# Patient Record
Sex: Female | Born: 1950
Health system: Southern US, Community
[De-identification: ages and names within clinical notes are randomized; demographics above are authoritative.]

## PROBLEM LIST (undated history)

## (undated) DIAGNOSIS — M810 Age-related osteoporosis without current pathological fracture: Secondary | ICD-10-CM

## (undated) DIAGNOSIS — G4730 Sleep apnea, unspecified: Secondary | ICD-10-CM

## (undated) DIAGNOSIS — K219 Gastro-esophageal reflux disease without esophagitis: Secondary | ICD-10-CM

## (undated) HISTORY — PX: CHOLECYSTECTOMY: SHX55

## (undated) HISTORY — PX: ABDOMINAL HYSTERECTOMY: SHX81

## (undated) HISTORY — PX: RHINOPLASTY: SHX2354

## (undated) HISTORY — PX: BACK SURGERY: SHX140

## (undated) HISTORY — PX: TUBAL LIGATION: SHX77

---

## 1997-11-09 ENCOUNTER — Other Ambulatory Visit: Admission: RE | Admit: 1997-11-09 | Discharge: 1997-11-09 | Payer: Self-pay | Admitting: *Deleted

## 1998-06-20 ENCOUNTER — Other Ambulatory Visit: Admission: RE | Admit: 1998-06-20 | Discharge: 1998-06-20 | Payer: Self-pay | Admitting: *Deleted

## 1998-07-07 HISTORY — PX: EYE SURGERY: SHX253

## 1999-01-14 ENCOUNTER — Other Ambulatory Visit: Admission: RE | Admit: 1999-01-14 | Discharge: 1999-01-14 | Payer: Self-pay | Admitting: Obstetrics and Gynecology

## 1999-11-21 ENCOUNTER — Other Ambulatory Visit: Admission: RE | Admit: 1999-11-21 | Discharge: 1999-11-21 | Payer: Self-pay | Admitting: Obstetrics and Gynecology

## 1999-11-22 ENCOUNTER — Encounter (INDEPENDENT_AMBULATORY_CARE_PROVIDER_SITE_OTHER): Payer: Self-pay

## 1999-11-22 ENCOUNTER — Other Ambulatory Visit: Admission: RE | Admit: 1999-11-22 | Discharge: 1999-11-22 | Payer: Self-pay | Admitting: Obstetrics and Gynecology

## 2000-04-03 ENCOUNTER — Other Ambulatory Visit: Admission: RE | Admit: 2000-04-03 | Discharge: 2000-04-03 | Payer: Self-pay | Admitting: Obstetrics and Gynecology

## 2000-04-09 ENCOUNTER — Encounter: Admission: RE | Admit: 2000-04-09 | Discharge: 2000-04-09 | Payer: Self-pay | Admitting: Obstetrics and Gynecology

## 2000-04-09 ENCOUNTER — Encounter: Payer: Self-pay | Admitting: Obstetrics and Gynecology

## 2000-04-15 ENCOUNTER — Encounter: Payer: Self-pay | Admitting: Obstetrics and Gynecology

## 2000-04-15 ENCOUNTER — Encounter: Admission: RE | Admit: 2000-04-15 | Discharge: 2000-04-15 | Payer: Self-pay | Admitting: Obstetrics and Gynecology

## 2005-02-28 ENCOUNTER — Emergency Department (HOSPITAL_COMMUNITY): Admission: EM | Admit: 2005-02-28 | Discharge: 2005-02-28 | Payer: Self-pay | Admitting: Emergency Medicine

## 2006-04-13 ENCOUNTER — Encounter (INDEPENDENT_AMBULATORY_CARE_PROVIDER_SITE_OTHER): Payer: Self-pay | Admitting: Specialist

## 2006-04-13 ENCOUNTER — Ambulatory Visit (HOSPITAL_COMMUNITY): Admission: RE | Admit: 2006-04-13 | Discharge: 2006-04-13 | Payer: Self-pay | Admitting: *Deleted

## 2008-05-23 ENCOUNTER — Encounter (INDEPENDENT_AMBULATORY_CARE_PROVIDER_SITE_OTHER): Payer: Self-pay | Admitting: Obstetrics

## 2008-05-23 ENCOUNTER — Ambulatory Visit (HOSPITAL_COMMUNITY): Admission: RE | Admit: 2008-05-23 | Discharge: 2008-05-24 | Payer: Self-pay | Admitting: Obstetrics

## 2009-01-29 ENCOUNTER — Ambulatory Visit (HOSPITAL_COMMUNITY): Admission: RE | Admit: 2009-01-29 | Discharge: 2009-01-29 | Payer: Self-pay | Admitting: Internal Medicine

## 2009-04-04 ENCOUNTER — Encounter (INDEPENDENT_AMBULATORY_CARE_PROVIDER_SITE_OTHER): Payer: Self-pay | Admitting: General Surgery

## 2009-04-04 ENCOUNTER — Observation Stay (HOSPITAL_COMMUNITY): Admission: RE | Admit: 2009-04-04 | Discharge: 2009-04-05 | Payer: Self-pay | Admitting: Surgery

## 2010-11-19 NOTE — Op Note (Signed)
Christy Flores, Christy Flores              ACCOUNT NO.:  [id number]   MEDICAL RECORD NO.:  [id number]          PATIENT TYPE:  OIB   LOCATION:  9309                          FACILITY:  [facility]   PHYSICIAN:  Lendon Colonel, MD   DATE OF BIRTH:  11-09-1950   DATE OF PROCEDURE:  05/23/2008  DATE OF DISCHARGE:                               OPERATIVE REPORT   PREOPERATIVE DIAGNOSES:  Persistent endometrial polyps, post menopausal  bleeding.   POSTOPERATIVE DIAGNOSES:  Persistent endometrial polyps, post menopausal  bleeding.   PROCEDURE:  Total laparoscopic hysterectomy, bilateral salpingo-  oophorectomy, cystoscopy.   SURGEON:  Lendon Colonel, MD   ASSISTANT:  Genia Del, MD   ANESTHESIA:  General.   FINDINGS:  Small uterus, normal tubes and ovaries, bilateral  peristalsing ureters, and normal cystoscopy post procedure.   SPECIMENS:  Uterine and cervix.   DISPOSITION:  To pathology.   ANTIBIOTICS:  1 g of Ancef.   ESTIMATED BLOOD LOSS:  100 mL.   COMPLICATIONS:  None.   PROCEDURE:  After informed consent for the procedure was obtained from  the patient, risks, benefits, and alternatives discussed.  The patient  was taken to the operating room where general anesthesia was induced  without difficulty.  She was prepped and draped in normal sterile  fashion in the dorsal supine lithotomy position.  The Foley catheter was  inserted sterilely into the bladder.  Bimanual examination was done to  assess the size and position of the uterus.  Sterile speculum was  performed.  Single-tooth tenaculum was used to grasp the anterior lip of  the cervix and the uterus was sounded to 8 cm.  The cervix was dilated  to a #23 Pratt and the #8 RUMI KOH catheter with the medium co-ring was  inserted into the uterus with co-ring around the cervix.  All  instruments were removed from the vagina.  Gloves were changed.  A 10-mm  skin incision was made in the infraumbilical fold after infusing  with  0.25% Marcaine.  This was carried through to the underlying layer of  fascia with the [employer] clamps.  The fascia was identified and grasped  with the Kochers and incised sharply.  Once the peritoneal cavity was  entered, a pursestring suture was placed along the fascial opening.  Hasson trocar was placed into the peritoneal cavity and pneumoperitoneum  was created to 15 mmHg.  Brief survey of the abdomen and pelvis revealed  essentially normal findings.  A 10-mm skin incision was made in left  lower quadrant and a 10-mm non-bladed trocar was inserted under direct  visualization.  A 5-mm skin incision was made in the right lower  quadrant and 5-mm non- bladed trocar was inserted under direct  visualization.  Grasping instruments were placed through these ports in  visualization of the patient's anatomy.  Bilateral ureter placement was  noted.  The EnSeal device was used to serially grasp, cauterize, and  transect the left round ligament, and the left IP ligament.  After the  round ligament was transected, the anterior leaf of broad ligament was  serially tented  up, cauterized, and transected to create a bladder flap,  this was on the left side.  On the right side, the right round ligament  was serially grasped, cauterized, and transected.  The anterior leaf of  the broad ligament on the right side was dissected off and the bladder  flap was created.  The right IP was then serially cauterized and  transected.  On the right side, the uterine artery was skeletonized and  transected with the EnSeal device.  On the left, additional traction was  used with the single-tooth tenaculum.  The left uterine artery was  identified, sealed, and transected with the EnSeal device.  Additional  dissection was taken down along the bladder flap.  The areolar tissue  was dissected off.  Once the bladder flap was clear, uterus was  appropriately blanched and good hemostasis was noted.  The Harmonic   scalpel was used to come across the upper vagina just inferior to the  external cervical os.  Harmonic scalpel was used to transect through the  RUMI KOH metallic ring.  Once the uterus and cervix were fully  transected, they were removed.  Uterus, cervix, bilateral tubes and  ovaries were removed through the vagina.  The balloon occluder was  replaced in the vagina to allow adequate pneumoperitoneum.  A 0 Vicryl  Quill suture with a Lapra-Ty on the distal end was placed through the  left lower quadrant port and starting at the right angle, the vaginal  cuff was closed just past the mid point of the incision.  The left angle  was then grasped with a separate suture of 0 Quill with a Lapra-Ty in  the end, and suturing was done across to the midpoint.  A finger was  placed in the vagina and noticed a defect in the midpoint of the vaginal  cuff and an additional suture was taken with the suture that started at  the left angle.  Once this midpoint was closed, the suture was cut and  removed.  A second check of the strength of the vaginal cuff was  performed with the manual exam under laparoscopic visualization.  At  this point, a very small hole was noted at the left angle.  Two  additional sutures were placed in left angle.  Excellent hemostasis was  noted.  The pelvis was irrigated with warm normal saline.  At this  point, a small amount of bleeding was noted from the peritoneal edge  just superior to the bladder flap and this was controlled with EnSeal  bipolar cautery.  The left ureter was seen peristalsing at the end of  the case; however, the right ureter was not seen peristalsing. Giving  the closeness of dissection to the right ureter due to the BSO decision  was made to proceed with cystoscopy.  Half a vile of indigo carmine was  given IV.  Cystoscopy was carried out.  The bladder was filled.  No  sutures or defects were noted in the bladder.  The bilateral ureters  were seen  peristalsing and blue dye and cystoscopy was finished.  Gloves  were changed.  Attention was turned to the patient's abdomen one last  time.  Again, good hemostasis was noted.  All pedicles were hemostatic  and the left 11-mm port had two 0 Vicryl sutures on a J-hook were thrown  through the fascia to create good closure of that defect.  Laparoscope  was used to ensure no underlying structures were caught.  All  gas was  then removed from the abdomen.  All trocars were then removed.  The  pursestring suture at the umbilicus was tied, 4-0 Vicryl interrupted  sutures were placed on the left lower quadrant of the umbilical  incisions and the remainder of the incisions were closed with Dermabond.  The patient tolerated the procedure well.  Sponge, lap, and needle  counts were correct x3.  The patient was taken to the recovery room in  stable condition.      Lendon Colonel, MD  Electronically Signed     KAF/MEDQ  D:  05/23/2008  T:  05/24/2008  Job:  [id number]

## 2010-11-22 NOTE — Op Note (Signed)
Christy Flores, Christy Flores              ACCOUNT NO.:  [id number]   MEDICAL RECORD NO.:  [id number]          PATIENT TYPE:  AMB   LOCATION:  SDC                           FACILITY:  [facility]   PHYSICIAN:  Mogul B. Earlene Plater, M.D.  DATE OF BIRTH:  [birth year]   DATE OF PROCEDURE:  04/13/2006  DATE OF DISCHARGE:                                 OPERATIVE REPORT   PREOPERATIVE DIAGNOSES:  1. Postmenopausal bleeding.  2. Endometrial polyp.   POSTOPERATIVE DIAGNOSES:  1. Postmenopausal bleeding.  2. Endometrial polyp.   OPERATION/PROCEDURE:  Hysteroscopy and resection of endometrial polyp.   SURGEON:  Chester Holstein. Earlene Plater, M.D.   ASSISTANT:  None.   ANESTHESIA:  LMA, general and 10 mL 1% Nesacaine paracervical block.   SPECIMENS:  Endometrial polyp.  Submitted to pathology.   ESTIMATED BLOOD LOSS:  Minimal.   FLUID DEFICIT:  40 mL sorbitol.   COMPLICATIONS:  None.   FINDINGS:  Overall suppressed appearing endometrium with approximately 2 cm  endometrial polyp arising from the left lower uterine segment.   INDICATIONS:  The patient with a history of postmenopausal bleeding.  Ultrasound and subsequent saline infusion ultrasound showed a large  endometrial polyp.  The patient was advised of the risks of surgery  including infection, bleeding, uterine perforation, damage to surrounding  organs.   DESCRIPTION OF PROCEDURE:  The patient was taken to operating room and LMA  general anesthesia obtained.  She was placed in the [place] stirrups and  prepped and draped in the standard fashion.  Bladder emptied with the hand  held catheter.  Examination under anesthesia showed a retroverted normal  size uterus.  No adnexal masses.   Speculum was inserted.  Paracervical block placed and cervix grasped with a  single-tooth tenaculum and dilated to #17.  The diagnostic hysteroscope was  inserted and polyp noted as outlined above.  Attempt was made to grasp the  polyp with Randall-Stone forceps.  However, it  was too large.  Therefore,  the cervix was dilated to allow passage of the resectoscope.   The resectoscope was inserted, single loop used and the polyp resected at is  base and removed.  The cavity appeared normal otherwise and the procedure  was terminated.   Instruments were removed. The cervix was hemostatic.  The patient tolerated  the procedure well. There were no complications.  She was taken to the  recovery room in stable condition.      Gerri Spore B. Earlene Plater, M.D.  Electronically Signed     WBD/MEDQ  D:  04/13/2006  T:  04/14/2006  Job:  [id number]

## 2011-04-08 LAB — URINALYSIS, ROUTINE W REFLEX MICROSCOPIC
Bilirubin Urine: NEGATIVE
Hgb urine dipstick: NEGATIVE
Ketones, ur: 15 — AB
Protein, ur: 30 — AB
Urobilinogen, UA: 0.2

## 2011-04-08 LAB — CBC
HCT: 34.1 — ABNORMAL LOW
Hemoglobin: 14.5
MCHC: 33.9
MCV: 98.2
Platelets: 202
RDW: 12.8
RDW: 13.3
WBC: 9

## 2011-04-08 LAB — URINE MICROSCOPIC-ADD ON

## 2011-04-08 LAB — BASIC METABOLIC PANEL
CO2: 29
Calcium: 9.2
Chloride: 102
Creatinine, Ser: 0.67
Glucose, Bld: 97
Sodium: 138

## 2014-04-05 ENCOUNTER — Ambulatory Visit (INDEPENDENT_AMBULATORY_CARE_PROVIDER_SITE_OTHER): Payer: BC Managed Care – PPO | Admitting: Podiatry

## 2014-04-05 ENCOUNTER — Ambulatory Visit (INDEPENDENT_AMBULATORY_CARE_PROVIDER_SITE_OTHER): Payer: BC Managed Care – PPO

## 2014-04-05 VITALS — BP 161/85 | HR 64 | Resp 17

## 2014-04-05 DIAGNOSIS — M722 Plantar fascial fibromatosis: Secondary | ICD-10-CM

## 2014-04-05 DIAGNOSIS — R52 Pain, unspecified: Secondary | ICD-10-CM

## 2014-04-05 MED ORDER — TRIAMCINOLONE ACETONIDE 10 MG/ML IJ SUSP
10.0000 mg | Freq: Once | INTRAMUSCULAR | Status: AC
  Administered 2014-04-05: 10 mg

## 2014-04-05 NOTE — Progress Notes (Signed)
   Subjective:    Patient ID: Christy Flores, female    DOB: 1950/09/12, 63 y.o.   MRN: [id number]  HPI [name] presents with bilateral heel pain which has been ongoing since July 2015.  He states that the pain is sharp and is worse in the morning but has become more constant throughout the day. She does state that she has had to decrease her physical activity due to the heel pain. She denies any history of injury or trauma to the area. She has been massaging her feet and performing stretching exercises which seem to help somewhat. No other complaints at this time.   Review of Systems  All other systems reviewed and are negative.      Objective:   Physical Exam AAO x3, NAD DP/PT pulses palpable 2/4 b/l. CRT < 3 sec Protective sensation intact with Simms Weinstein monofilament, vibratory sensation intact, Achilles tendon reflex intact. Tenderness to palpation over the plantar medial tubercle of the calcaneus bilaterally at the insertion of the plantar fascia. Mild tenderness along the medial band of the plantar fascia. No pain with lateral compression of the calcaneus or along the posterior aspect. Plantar fascia appears to be intact without any discomfort into the arch of the foot. MMT 5/5, ROM WNL No open skin lesions. No calf pain, swelling, warmth.        Assessment & Plan:  63 year old female bilateral plantar fasciitis. -X-rays were obtained and reviewed the patient. -Conservative versus surgical treatment were discussed including alternatives, risks, complications. -Patient elects to proceed with steroid injection into the bilateral heels. Under sterile skin preparation, a total of 2.5cc of kenalog 10, 0.5% Marcaine plain, and 2% lidocaine plain were infiltrated into the symptomatic area without complication. A band-aid was applied. Patient tolerated the injection well without complication.  -Bilateral plantar fascial brace applied. -Ice to the effected area. -Discussed  stretching exercises. -Discussed supportive shoe gear and possible orthotic therapy. -Followup in 3 weeks or sooner if any problems are to arise or any changes symptoms. In the meantime call any questions, concerns.

## 2014-04-05 NOTE — Patient Instructions (Signed)
Plantar Fasciitis (Heel Spur Syndrome) with Rehab The plantar fascia is a fibrous, ligament-like, soft-tissue structure that spans the bottom of the foot. Plantar fasciitis is a condition that causes pain in the foot due to inflammation of the tissue. SYMPTOMS   Pain and tenderness on the underneath side of the foot.  Pain that worsens with standing or walking. CAUSES  Plantar fasciitis is caused by irritation and injury to the plantar fascia on the underneath side of the foot. Common mechanisms of injury include:  Direct trauma to bottom of the foot.  Damage to a small nerve that runs under the foot where the main fascia attaches to the heel bone.  Stress placed on the plantar fascia due to bone spurs. RISK INCREASES WITH:   Activities that place stress on the plantar fascia (running, jumping, pivoting, or cutting).  Poor strength and flexibility.  Improperly fitted shoes.  Tight calf muscles.  Flat feet.  Failure to warm-up properly before activity.  Obesity. PREVENTION  Warm up and stretch properly before activity.  Allow for adequate recovery between workouts.  Maintain physical fitness:  Strength, flexibility, and endurance.  Cardiovascular fitness.  Maintain a health body weight.  Avoid stress on the plantar fascia.  Wear properly fitted shoes, including arch supports for individuals who have flat feet. PROGNOSIS  If treated properly, then the symptoms of plantar fasciitis usually resolve without surgery. However, occasionally surgery is necessary. RELATED COMPLICATIONS   Recurrent symptoms that may result in a chronic condition.  Problems of the lower back that are caused by compensating for the injury, such as limping.  Pain or weakness of the foot during push-off following surgery.  Chronic inflammation, scarring, and partial or complete fascia tear, occurring more often from repeated injections. TREATMENT  Treatment initially involves the use of  ice and medication to help reduce pain and inflammation. The use of strengthening and stretching exercises may help reduce pain with activity, especially stretches of the Achilles tendon. These exercises may be performed at home or with a therapist. Your caregiver may recommend that you use heel cups of arch supports to help reduce stress on the plantar fascia. Occasionally, corticosteroid injections are given to reduce inflammation. If symptoms persist for greater than 6 months despite non-surgical (conservative), then surgery may be recommended.  MEDICATION   If pain medication is necessary, then nonsteroidal anti-inflammatory medications, such as aspirin and ibuprofen, or other minor pain relievers, such as acetaminophen, are often recommended.  Do not take pain medication within 7 days before surgery.  Prescription pain relievers may be given if deemed necessary by your caregiver. Use only as directed and only as much as you need.  Corticosteroid injections may be given by your caregiver. These injections should be reserved for the most serious cases, because they may only be given a certain number of times. HEAT AND COLD  Cold treatment (icing) relieves pain and reduces inflammation. Cold treatment should be applied for 10 to 15 minutes every 2 to 3 hours for inflammation and pain and immediately after any activity that aggravates your symptoms. Use ice packs or massage the area with a piece of ice (ice massage).  Heat treatment may be used prior to performing the stretching and strengthening activities prescribed by your caregiver, physical therapist, or athletic trainer. Use a heat pack or soak the injury in warm water. SEEK IMMEDIATE MEDICAL CARE IF:  Treatment seems to offer no benefit, or the condition worsens.  Any medications produce adverse side effects. EXERCISES RANGE   OF MOTION (ROM) AND STRETCHING EXERCISES - Plantar Fasciitis (Heel Spur Syndrome) These exercises may help you  when beginning to rehabilitate your injury. Your symptoms may resolve with or without further involvement from your physician, physical therapist or athletic trainer. While completing these exercises, remember:   Restoring tissue flexibility helps normal motion to return to the joints. This allows healthier, less painful movement and activity.  An effective stretch should be held for at least 30 seconds.  A stretch should never be painful. You should only feel a gentle lengthening or release in the stretched tissue. RANGE OF MOTION - Toe Extension, Flexion  Sit with your right / left leg crossed over your opposite knee.  Grasp your toes and gently pull them back toward the top of your foot. You should feel a stretch on the bottom of your toes and/or foot.  Hold this stretch for __________ seconds.  Now, gently pull your toes toward the bottom of your foot. You should feel a stretch on the top of your toes and or foot.  Hold this stretch for __________ seconds. Repeat __________ times. Complete this stretch __________ times per day.  RANGE OF MOTION - Ankle Dorsiflexion, Active Assisted  Remove shoes and sit on a chair that is preferably not on a carpeted surface.  Place right / left foot under knee. Extend your opposite leg for support.  Keeping your heel down, slide your right / left foot back toward the chair until you feel a stretch at your ankle or calf. If you do not feel a stretch, slide your bottom forward to the edge of the chair, while still keeping your heel down.  Hold this stretch for __________ seconds. Repeat __________ times. Complete this stretch __________ times per day.  STRETCH - Gastroc, Standing  Place hands on wall.  Extend right / left leg, keeping the front knee somewhat bent.  Slightly point your toes inward on your back foot.  Keeping your right / left heel on the floor and your knee straight, shift your weight toward the wall, not allowing your back to  arch.  You should feel a gentle stretch in the right / left calf. Hold this position for __________ seconds. Repeat __________ times. Complete this stretch __________ times per day. STRETCH - Soleus, Standing  Place hands on wall.  Extend right / left leg, keeping the other knee somewhat bent.  Slightly point your toes inward on your back foot.  Keep your right / left heel on the floor, bend your back knee, and slightly shift your weight over the back leg so that you feel a gentle stretch deep in your back calf.  Hold this position for __________ seconds. Repeat __________ times. Complete this stretch __________ times per day. STRETCH - Gastrocsoleus, Standing  Note: This exercise can place a lot of stress on your foot and ankle. Please complete this exercise only if specifically instructed by your caregiver.   Place the ball of your right / left foot on a step, keeping your other foot firmly on the same step.  Hold on to the wall or a rail for balance.  Slowly lift your other foot, allowing your body weight to press your heel down over the edge of the step.  You should feel a stretch in your right / left calf.  Hold this position for __________ seconds.  Repeat this exercise with a slight bend in your right / left knee. Repeat __________ times. Complete this stretch __________ times per day.    STRENGTHENING EXERCISES - Plantar Fasciitis (Heel Spur Syndrome)  These exercises may help you when beginning to rehabilitate your injury. They may resolve your symptoms with or without further involvement from your physician, physical therapist or athletic trainer. While completing these exercises, remember:   Muscles can gain both the endurance and the strength needed for everyday activities through controlled exercises.  Complete these exercises as instructed by your physician, physical therapist or athletic trainer. Progress the resistance and repetitions only as guided. STRENGTH -  Towel Curls  Sit in a chair positioned on a non-carpeted surface.  Place your foot on a towel, keeping your heel on the floor.  Pull the towel toward your heel by only curling your toes. Keep your heel on the floor.  If instructed by your physician, physical therapist or athletic trainer, add ____________________ at the end of the towel. Repeat __________ times. Complete this exercise __________ times per day. STRENGTH - Ankle Inversion  Secure one end of a rubber exercise band/tubing to a fixed object (table, pole). Loop the other end around your foot just before your toes.  Place your fists between your knees. This will focus your strengthening at your ankle.  Slowly, pull your big toe up and in, making sure the band/tubing is positioned to resist the entire motion.  Hold this position for __________ seconds.  Have your muscles resist the band/tubing as it slowly pulls your foot back to the starting position. Repeat __________ times. Complete this exercises __________ times per day.  Document Released: 06/23/2005 Document Revised: 09/15/2011 Document Reviewed: 10/05/2008 [employer] Patient Information 2015 [employer]. This information is not intended to replace advice given to you by your health care provider. Make sure you discuss any questions you have with your health care provider.  

## 2014-04-26 ENCOUNTER — Encounter: Payer: Self-pay | Admitting: Podiatry

## 2014-04-26 ENCOUNTER — Ambulatory Visit (INDEPENDENT_AMBULATORY_CARE_PROVIDER_SITE_OTHER): Payer: BC Managed Care – PPO | Admitting: Podiatry

## 2014-04-26 VITALS — BP 128/71 | HR 75 | Resp 13

## 2014-04-26 DIAGNOSIS — M722 Plantar fascial fibromatosis: Secondary | ICD-10-CM

## 2014-04-28 NOTE — Progress Notes (Signed)
Patient ID: Christy Flores, female   DOB: 01/05/1951, 63 y.o.   MRN: [id number]  Subjective: [name], 63 year old female, returns the office they for followup evaluation of bilateral heel pain which has been ongoing since July 2015. I last appointment the patient underwent steroid injection and she has been using the plantar fascial braces. She is also been icing as well as performing the stretching exercises. She states that she has significant improvement in her symptoms. She gets occasional mild discomfort in the bottom of her heels however it is intermittent and much improved. Denies any acute changes since last appointment. No other complaints at this time.  Objective: AAO x3, NAD DP/PT pulses palpable bilaterally, CRT less than 3 seconds Protective sensation intact with Simms Weinstein monofilament, vibratory sensation intact, Achilles tendon reflex intact No tenderness to palpation of the plantar medial tubercle of the calcaneus at the insertion of the plantar fascia. No pain with lateral compression of the calcaneus or along the posterior aspect. No pain with vibratory sensation. No pain along the course of the plantar fascia within the arch of the foot. No edema, erythema, increased warmth to the area. Mild equinus bilaterally. MMT 5/5, ROM WNL No calf pain, swelling, warmth, erythema No open lesions or pre-ulcer lesions  Assessment: 63 year old female followup evaluation of bilateral plantar fasciitis  Plan: -Treatment options discussed including alternatives, risks, complications -At this time patient is doing well therefore we'll hold off on another steroid injection this time. -Continue stretching and icing. -Continue supportive shoe gear -Continue plantar fascial brace as needed -Followup as needed. In the meantime call the office with any questions, concerns, change in symptoms.

## 2014-12-29 DIAGNOSIS — E559 Vitamin D deficiency, unspecified: Secondary | ICD-10-CM | POA: Insufficient documentation

## 2014-12-29 DIAGNOSIS — J309 Allergic rhinitis, unspecified: Secondary | ICD-10-CM | POA: Insufficient documentation

## 2014-12-29 DIAGNOSIS — E669 Obesity, unspecified: Secondary | ICD-10-CM | POA: Insufficient documentation

## 2014-12-29 DIAGNOSIS — G4733 Obstructive sleep apnea (adult) (pediatric): Secondary | ICD-10-CM | POA: Insufficient documentation

## 2018-07-08 ENCOUNTER — Other Ambulatory Visit (HOSPITAL_COMMUNITY): Payer: Self-pay | Admitting: Internal Medicine

## 2018-07-08 DIAGNOSIS — R109 Unspecified abdominal pain: Secondary | ICD-10-CM

## 2018-07-19 ENCOUNTER — Ambulatory Visit (HOSPITAL_COMMUNITY)
Admission: RE | Admit: 2018-07-19 | Discharge: 2018-07-19 | Disposition: A | Discharge status: Home / Self Care | Payer: Medicare Other | Source: Ambulatory Visit | Attending: Internal Medicine | Admitting: Internal Medicine

## 2018-07-19 DIAGNOSIS — R109 Unspecified abdominal pain: Secondary | ICD-10-CM | POA: Insufficient documentation

## 2018-09-03 ENCOUNTER — Ambulatory Visit (HOSPITAL_COMMUNITY)
Admission: RE | Admit: 2018-09-03 | Discharge: 2018-09-03 | Disposition: A | Discharge status: Home / Self Care | Payer: Medicare Other | Source: Ambulatory Visit | Attending: Internal Medicine | Admitting: Internal Medicine

## 2018-09-03 ENCOUNTER — Other Ambulatory Visit (HOSPITAL_COMMUNITY): Payer: Self-pay | Admitting: Internal Medicine

## 2018-09-03 DIAGNOSIS — M8580 Other specified disorders of bone density and structure, unspecified site: Secondary | ICD-10-CM | POA: Insufficient documentation

## 2018-09-03 DIAGNOSIS — M810 Age-related osteoporosis without current pathological fracture: Secondary | ICD-10-CM | POA: Insufficient documentation

## 2018-09-24 ENCOUNTER — Encounter (HOSPITAL_COMMUNITY): Admission: RE | Admit: 2018-09-24 | Payer: Medicare Other | Source: Ambulatory Visit

## 2018-12-06 ENCOUNTER — Encounter (HOSPITAL_COMMUNITY): Payer: Self-pay | Admitting: *Deleted

## 2018-12-23 ENCOUNTER — Other Ambulatory Visit: Payer: Self-pay

## 2018-12-23 ENCOUNTER — Encounter (HOSPITAL_COMMUNITY)
Admission: RE | Admit: 2018-12-23 | Discharge: 2018-12-23 | Disposition: A | Discharge status: Home / Self Care | Payer: Medicare Other | Source: Ambulatory Visit | Attending: Internal Medicine | Admitting: Internal Medicine

## 2018-12-23 DIAGNOSIS — M8000XA Age-related osteoporosis with current pathological fracture, unspecified site, initial encounter for fracture: Secondary | ICD-10-CM | POA: Diagnosis present

## 2018-12-23 MED ORDER — DENOSUMAB 60 MG/ML ~~LOC~~ SOSY
60.0000 mg | PREFILLED_SYRINGE | Freq: Once | SUBCUTANEOUS | Status: AC
  Administered 2018-12-23: 60 mg via SUBCUTANEOUS

## 2019-06-24 ENCOUNTER — Other Ambulatory Visit: Payer: Self-pay

## 2019-06-24 ENCOUNTER — Encounter (HOSPITAL_COMMUNITY)
Admission: RE | Admit: 2019-06-24 | Discharge: 2019-06-24 | Disposition: A | Discharge status: Home / Self Care | Payer: Medicare Other | Source: Ambulatory Visit | Attending: Internal Medicine | Admitting: Internal Medicine

## 2019-06-24 DIAGNOSIS — M810 Age-related osteoporosis without current pathological fracture: Secondary | ICD-10-CM | POA: Diagnosis not present

## 2019-06-24 MED ORDER — DENOSUMAB 60 MG/ML ~~LOC~~ SOSY
60.0000 mg | PREFILLED_SYRINGE | Freq: Once | SUBCUTANEOUS | Status: AC
  Administered 2019-06-24: 11:00:00 60 mg via SUBCUTANEOUS

## 2019-12-23 ENCOUNTER — Encounter (HOSPITAL_COMMUNITY)
Admission: RE | Admit: 2019-12-23 | Discharge: 2019-12-23 | Disposition: A | Discharge status: Home / Self Care | Payer: Medicare PPO | Source: Ambulatory Visit | Attending: Internal Medicine | Admitting: Internal Medicine

## 2019-12-23 ENCOUNTER — Other Ambulatory Visit: Payer: Self-pay

## 2019-12-23 ENCOUNTER — Encounter (HOSPITAL_COMMUNITY): Payer: Medicare Other

## 2019-12-23 DIAGNOSIS — M810 Age-related osteoporosis without current pathological fracture: Secondary | ICD-10-CM | POA: Diagnosis not present

## 2019-12-23 MED ORDER — DENOSUMAB 60 MG/ML ~~LOC~~ SOSY
60.0000 mg | PREFILLED_SYRINGE | Freq: Once | SUBCUTANEOUS | Status: AC
  Administered 2019-12-23: 60 mg via SUBCUTANEOUS

## 2019-12-23 MED ORDER — DENOSUMAB 60 MG/ML ~~LOC~~ SOSY
PREFILLED_SYRINGE | SUBCUTANEOUS | Status: AC
  Filled 2019-12-23: qty 1

## 2020-02-13 DIAGNOSIS — H43812 Vitreous degeneration, left eye: Secondary | ICD-10-CM | POA: Diagnosis not present

## 2020-04-05 DIAGNOSIS — H02831 Dermatochalasis of right upper eyelid: Secondary | ICD-10-CM | POA: Diagnosis not present

## 2020-04-05 DIAGNOSIS — H02834 Dermatochalasis of left upper eyelid: Secondary | ICD-10-CM | POA: Diagnosis not present

## 2020-05-17 ENCOUNTER — Ambulatory Visit: Payer: Medicare PPO | Attending: Internal Medicine

## 2020-05-17 DIAGNOSIS — Z23 Encounter for immunization: Secondary | ICD-10-CM

## 2020-05-17 NOTE — Progress Notes (Signed)
   Covid-19 Vaccination Clinic  Name:  KIMANH TEMPLEMAN    MRN: [id number] DOB: 1950/11/10  05/17/2020  Ms. Grinnell was observed post Covid-19 immunization for 15 minutes without incident. She was provided with Vaccine Information Sheet and instruction to access the V-Safe system.   Ms. Mccuistion was instructed to call 911 with any severe reactions post vaccine: Marland Kitchen Difficulty breathing  . Swelling of face and throat  . A fast heartbeat  . A bad rash all over body  . Dizziness and weakness

## 2020-06-13 DIAGNOSIS — Z23 Encounter for immunization: Secondary | ICD-10-CM | POA: Diagnosis not present

## 2020-06-25 ENCOUNTER — Other Ambulatory Visit: Payer: Self-pay

## 2020-06-25 ENCOUNTER — Encounter (HOSPITAL_COMMUNITY)
Admission: RE | Admit: 2020-06-25 | Discharge: 2020-06-25 | Disposition: A | Discharge status: Home / Self Care | Payer: Medicare PPO | Source: Ambulatory Visit | Attending: Internal Medicine | Admitting: Internal Medicine

## 2020-06-25 ENCOUNTER — Encounter (HOSPITAL_COMMUNITY): Payer: Self-pay

## 2020-06-25 DIAGNOSIS — M810 Age-related osteoporosis without current pathological fracture: Secondary | ICD-10-CM | POA: Diagnosis not present

## 2020-06-25 HISTORY — DX: Age-related osteoporosis without current pathological fracture: M81.0

## 2020-06-25 HISTORY — DX: Gastro-esophageal reflux disease without esophagitis: K21.9

## 2020-06-25 HISTORY — DX: Sleep apnea, unspecified: G47.30

## 2020-06-25 LAB — POCT I-STAT, CHEM 8
BUN: 14 mg/dL (ref 8–23)
Calcium, Ion: 1.17 mmol/L (ref 1.15–1.40)
Chloride: 103 mmol/L (ref 98–111)
Creatinine, Ser: 0.7 mg/dL (ref 0.44–1.00)
Glucose, Bld: 98 mg/dL (ref 70–99)
HCT: 41 % (ref 36.0–46.0)
Hemoglobin: 13.9 g/dL (ref 12.0–15.0)
Potassium: 4.3 mmol/L (ref 3.5–5.1)
Sodium: 139 mmol/L (ref 135–145)
TCO2: 26 mmol/L (ref 22–32)

## 2020-06-25 MED ORDER — DENOSUMAB 60 MG/ML ~~LOC~~ SOSY
60.0000 mg | PREFILLED_SYRINGE | Freq: Once | SUBCUTANEOUS | Status: AC
  Administered 2020-06-25: 60 mg via SUBCUTANEOUS

## 2020-08-22 DIAGNOSIS — H02423 Myogenic ptosis of bilateral eyelids: Secondary | ICD-10-CM | POA: Diagnosis not present

## 2020-08-22 DIAGNOSIS — H02834 Dermatochalasis of left upper eyelid: Secondary | ICD-10-CM | POA: Diagnosis not present

## 2020-08-22 DIAGNOSIS — H02831 Dermatochalasis of right upper eyelid: Secondary | ICD-10-CM | POA: Diagnosis not present

## 2020-11-26 DIAGNOSIS — H02834 Dermatochalasis of left upper eyelid: Secondary | ICD-10-CM | POA: Diagnosis not present

## 2020-11-26 DIAGNOSIS — H02423 Myogenic ptosis of bilateral eyelids: Secondary | ICD-10-CM | POA: Diagnosis not present

## 2020-11-26 DIAGNOSIS — H02831 Dermatochalasis of right upper eyelid: Secondary | ICD-10-CM | POA: Diagnosis not present

## 2020-12-24 ENCOUNTER — Ambulatory Visit (HOSPITAL_COMMUNITY): Payer: Medicare PPO

## 2020-12-25 ENCOUNTER — Other Ambulatory Visit: Payer: Self-pay

## 2020-12-25 ENCOUNTER — Encounter (HOSPITAL_COMMUNITY)
Admission: RE | Admit: 2020-12-25 | Discharge: 2020-12-25 | Disposition: A | Discharge status: Home / Self Care | Payer: Medicare PPO | Source: Ambulatory Visit | Attending: Internal Medicine | Admitting: Internal Medicine

## 2020-12-25 DIAGNOSIS — M810 Age-related osteoporosis without current pathological fracture: Secondary | ICD-10-CM | POA: Insufficient documentation

## 2020-12-25 MED ORDER — DENOSUMAB 60 MG/ML ~~LOC~~ SOSY
PREFILLED_SYRINGE | SUBCUTANEOUS | Status: AC
  Filled 2020-12-25: qty 1

## 2020-12-25 MED ORDER — DENOSUMAB 60 MG/ML ~~LOC~~ SOSY
60.0000 mg | PREFILLED_SYRINGE | Freq: Once | SUBCUTANEOUS | Status: AC
  Administered 2020-12-25: 60 mg via SUBCUTANEOUS

## 2021-04-04 DIAGNOSIS — Z23 Encounter for immunization: Secondary | ICD-10-CM | POA: Diagnosis not present

## 2021-04-29 ENCOUNTER — Other Ambulatory Visit: Payer: Self-pay | Admitting: Internal Medicine

## 2021-04-29 DIAGNOSIS — Z1231 Encounter for screening mammogram for malignant neoplasm of breast: Secondary | ICD-10-CM

## 2021-05-23 ENCOUNTER — Ambulatory Visit: Payer: Medicare PPO

## 2021-06-26 ENCOUNTER — Encounter (HOSPITAL_COMMUNITY)
Admission: RE | Admit: 2021-06-26 | Discharge: 2021-06-26 | Disposition: A | Discharge status: Home / Self Care | Payer: Medicare PPO | Source: Ambulatory Visit | Attending: Internal Medicine | Admitting: Internal Medicine

## 2021-07-03 ENCOUNTER — Other Ambulatory Visit: Payer: Self-pay

## 2021-07-03 ENCOUNTER — Encounter (HOSPITAL_COMMUNITY)
Admission: RE | Admit: 2021-07-03 | Discharge: 2021-07-03 | Disposition: A | Discharge status: Home / Self Care | Payer: Medicare PPO | Source: Ambulatory Visit | Attending: Internal Medicine | Admitting: Internal Medicine

## 2021-07-03 DIAGNOSIS — M810 Age-related osteoporosis without current pathological fracture: Secondary | ICD-10-CM | POA: Insufficient documentation

## 2021-07-03 MED ORDER — DENOSUMAB 60 MG/ML ~~LOC~~ SOSY
PREFILLED_SYRINGE | SUBCUTANEOUS | Status: AC
  Filled 2021-07-03: qty 1

## 2021-07-03 MED ORDER — DENOSUMAB 60 MG/ML ~~LOC~~ SOSY
60.0000 mg | PREFILLED_SYRINGE | Freq: Once | SUBCUTANEOUS | Status: AC
  Administered 2021-07-03: 15:00:00 60 mg via SUBCUTANEOUS

## 2021-07-07 DIAGNOSIS — Z923 Personal history of irradiation: Secondary | ICD-10-CM

## 2021-07-07 HISTORY — PX: BREAST LUMPECTOMY: SHX2

## 2021-07-07 HISTORY — DX: Personal history of irradiation: Z92.3

## 2021-08-26 DIAGNOSIS — Z01810 Encounter for preprocedural cardiovascular examination: Secondary | ICD-10-CM | POA: Diagnosis not present

## 2021-10-04 ENCOUNTER — Other Ambulatory Visit (HOSPITAL_COMMUNITY): Payer: Self-pay | Admitting: Internal Medicine

## 2021-10-04 DIAGNOSIS — Z1231 Encounter for screening mammogram for malignant neoplasm of breast: Secondary | ICD-10-CM

## 2021-10-07 ENCOUNTER — Ambulatory Visit (HOSPITAL_COMMUNITY)
Admission: RE | Admit: 2021-10-07 | Discharge: 2021-10-07 | Disposition: A | Discharge status: Home / Self Care | Payer: Medicare PPO | Source: Ambulatory Visit | Attending: Internal Medicine | Admitting: Internal Medicine

## 2021-10-07 DIAGNOSIS — Z1231 Encounter for screening mammogram for malignant neoplasm of breast: Secondary | ICD-10-CM | POA: Diagnosis not present

## 2021-10-08 ENCOUNTER — Other Ambulatory Visit (HOSPITAL_COMMUNITY): Payer: Self-pay | Admitting: Internal Medicine

## 2021-10-08 DIAGNOSIS — R928 Other abnormal and inconclusive findings on diagnostic imaging of breast: Secondary | ICD-10-CM

## 2021-10-22 ENCOUNTER — Ambulatory Visit (HOSPITAL_COMMUNITY)
Admission: RE | Admit: 2021-10-22 | Discharge: 2021-10-22 | Disposition: A | Discharge status: Home / Self Care | Payer: Medicare PPO | Source: Ambulatory Visit | Attending: Internal Medicine | Admitting: Internal Medicine

## 2021-10-22 ENCOUNTER — Other Ambulatory Visit (HOSPITAL_COMMUNITY): Payer: Self-pay | Admitting: Internal Medicine

## 2021-10-22 DIAGNOSIS — R928 Other abnormal and inconclusive findings on diagnostic imaging of breast: Secondary | ICD-10-CM

## 2021-10-22 DIAGNOSIS — N6315 Unspecified lump in the right breast, overlapping quadrants: Secondary | ICD-10-CM | POA: Diagnosis not present

## 2021-10-23 ENCOUNTER — Other Ambulatory Visit (HOSPITAL_COMMUNITY): Payer: Self-pay | Admitting: Internal Medicine

## 2021-10-23 ENCOUNTER — Ambulatory Visit (HOSPITAL_COMMUNITY)
Admission: RE | Admit: 2021-10-23 | Discharge: 2021-10-23 | Disposition: A | Discharge status: Home / Self Care | Payer: Medicare PPO | Source: Ambulatory Visit | Attending: Internal Medicine | Admitting: Internal Medicine

## 2021-10-23 ENCOUNTER — Other Ambulatory Visit: Payer: Self-pay | Admitting: Internal Medicine

## 2021-10-23 DIAGNOSIS — N6341 Unspecified lump in right breast, subareolar: Secondary | ICD-10-CM | POA: Diagnosis not present

## 2021-10-23 DIAGNOSIS — R921 Mammographic calcification found on diagnostic imaging of breast: Secondary | ICD-10-CM

## 2021-10-23 DIAGNOSIS — R928 Other abnormal and inconclusive findings on diagnostic imaging of breast: Secondary | ICD-10-CM | POA: Insufficient documentation

## 2021-10-23 MED ORDER — LIDOCAINE HCL (PF) 2 % IJ SOLN
INTRAMUSCULAR | Status: AC
  Filled 2021-10-23: qty 10

## 2021-10-23 MED ORDER — LIDOCAINE-EPINEPHRINE (PF) 1 %-1:200000 IJ SOLN
INTRAMUSCULAR | Status: AC
  Filled 2021-10-23: qty 30

## 2021-10-28 LAB — SURGICAL PATHOLOGY

## 2021-10-31 ENCOUNTER — Other Ambulatory Visit (HOSPITAL_COMMUNITY): Payer: Medicare PPO

## 2021-10-31 ENCOUNTER — Encounter (HOSPITAL_COMMUNITY): Payer: Medicare PPO

## 2021-11-06 ENCOUNTER — Ambulatory Visit
Admission: RE | Admit: 2021-11-06 | Discharge: 2021-11-06 | Disposition: A | Discharge status: Home / Self Care | Payer: Medicare PPO | Source: Ambulatory Visit | Attending: Internal Medicine | Admitting: Internal Medicine

## 2021-11-06 DIAGNOSIS — R921 Mammographic calcification found on diagnostic imaging of breast: Secondary | ICD-10-CM

## 2021-11-06 DIAGNOSIS — R928 Other abnormal and inconclusive findings on diagnostic imaging of breast: Secondary | ICD-10-CM

## 2021-11-06 DIAGNOSIS — N6011 Diffuse cystic mastopathy of right breast: Secondary | ICD-10-CM | POA: Diagnosis not present

## 2021-11-06 HISTORY — PX: BREAST BIOPSY: SHX20

## 2021-11-08 DIAGNOSIS — Z170 Estrogen receptor positive status [ER+]: Secondary | ICD-10-CM | POA: Diagnosis not present

## 2021-11-08 DIAGNOSIS — Z803 Family history of malignant neoplasm of breast: Secondary | ICD-10-CM | POA: Diagnosis not present

## 2021-11-08 DIAGNOSIS — C50111 Malignant neoplasm of central portion of right female breast: Secondary | ICD-10-CM | POA: Diagnosis not present

## 2021-11-08 DIAGNOSIS — C50011 Malignant neoplasm of nipple and areola, right female breast: Secondary | ICD-10-CM | POA: Diagnosis not present

## 2021-11-11 ENCOUNTER — Other Ambulatory Visit: Payer: Self-pay | Admitting: General Surgery

## 2021-11-11 DIAGNOSIS — C50111 Malignant neoplasm of central portion of right female breast: Secondary | ICD-10-CM

## 2021-11-11 NOTE — Therapy (Signed)
?OUTPATIENT PHYSICAL THERAPY BREAST CANCER BASELINE EVALUATION ? ? ?Patient Name: Christy Flores ?MRN: [id number] ?DOB:Jan 04, 1951, 71 y.o., female ?Today's Date: 11/12/2021 ? ? PT End of Session - 11/12/21 1139   ? ? Visit Number 1   ? Number of Visits 2   ? Date for PT Re-Evaluation 01/07/22   ? Authorization Type [employer]   ? Authorization - Visit Number 2   ? PT Start Time 1100   ? PT Stop Time 9485   ? PT Time Calculation (min) 38 min   ? Activity Tolerance Patient tolerated treatment well   ? Behavior During Therapy [facility] for tasks assessed/performed   ? ?  ?  ? ?  ? ? ?Past Medical History:  ?Diagnosis Date  ? GERD (gastroesophageal reflux disease)   ? Senile osteoporosis   ? Sleep apnea   ? on CPAP  ? ?Past Surgical History:  ?Procedure Laterality Date  ? ABDOMINAL HYSTERECTOMY    ? BACK SURGERY    ? RHINOPLASTY    ? TUBAL LIGATION    ? ?There are no problems to display for this patient. ? ? ?PCP: Asencion Noble, MD ? ?REFERRING PROVIDER: Rolm Bookbinder, MD ? ?REFERRING DIAG: Right Breast Cancer ? ?THERAPY DIAG:  ?Malignant neoplasm of central portion of right breast in female, estrogen receptor positive ([place]) ? ?Abnormal posture ? ?ONSET DATE: 11/08/2022 ? ?SUBJECTIVE                                                                                                                                                                                          ? ?SUBJECTIVE STATEMENT: ?Patient reports she is here today to be seen by her medical team for her newly diagnosed right breast cancer.  ? ?PERTINENT HISTORY:  ?Patient was diagnosed on 11/07/2021 with right Invasive Ductal Carcinoma grade 1. It measures .7  and . 3  cm and is located in the central quadrant. It is ER+, PR=+,Her 2- with a  low Ki67 .She has a sister with breast cancer in her 29s who she thinks may have some unknown genetic mutation. She has a maternal grandmother and 5 maternal aunts with breast cancer. She thinks her mother had ovarian cancer. She has a  brother and sister with colon cancer. She has a brother and her father both having melanoma.  ? ?PATIENT GOALS   reduce lymphedema risk and learn post op HEP.  ? ?PAIN:  ?Are you having pain? No ? ? ?PRECAUTIONS: Active CA Other: Osteoporosis, Back sx ? ?HAND DOMINANCE: right ? ?WEIGHT BEARING RESTRICTIONS No ? ?FALLS:  ?Has patient fallen in last 6 months? No ? ?LIVING  ENVIRONMENT: ?Patient lives with: husband ?Lives in: House/apartment ?Has following equipment at home: NA ? ?OCCUPATION: retired, but may take a part time job ? ?LEISURE: walking, reading, sewing, walking on beach ? ?PRIOR LEVEL OF FUNCTION: Independent ? ? ?OBJECTIVE ? ?COGNITION: ? Overall cognitive status: Within functional limits for tasks assessed   ? ?POSTURE:  ?Forward head and rounded shoulders posture ? ?UPPER EXTREMITY AROM/PROM: ? ?A/PROM RIGHT  11/12/2021 ?  ?Shoulder extension 65  ?Shoulder flexion 148  ?Shoulder abduction 170  ?Shoulder internal rotation 70  ?Shoulder external rotation 100  ?  (Blank rows = not tested) ? ?A/PROM LEFT  11/12/2021  ?Shoulder extension 57  ?Shoulder flexion 150  ?Shoulder abduction 165  ?Shoulder internal rotation 70  ?Shoulder external rotation 85  ?  (Blank rows = not tested) ? ? ?CERVICAL AROM: ?All within functional limits: 40-50% limitations ?UPPER EXTREMITY STRENGTH: WNL ? ? ?LYMPHEDEMA ASSESSMENTS:  ? ?[place] RIGHT  11/12/2021  ?10 cm proximal to olecranon process 32.3  ?Olecranon process 27.2  ?10 cm proximal to ulnar styloid process 23.7  ?Just proximal to ulnar styloid process 17.9  ?Across hand at thumb web space 19.9  ?At base of 2nd digit 6.9  ?(Blank rows = not tested) ? ?[place] LEFT  11/12/2021  ?10 cm proximal to olecranon process 34.5  ?Olecranon process 27.5  ?10 cm proximal to ulnar styloid process 23.8  ?Just proximal to ulnar styloid process 17.3  ?Across hand at thumb web space 18.8  ?At base of 2nd digit 6.7  ?(Blank rows = not tested) ? ? ?L-DEX LYMPHEDEMA SCREENING: ? ?The patient was  assessed using the L-Dex machine today to produce a lymphedema index baseline score. The patient will be reassessed on a regular basis (typically every 3 months) to obtain new L-Dex scores. If the score is > 6.5 points away from his/her baseline score indicating onset of subclinical lymphedema, it will be recommended to wear a compression garment for 4 weeks, 12 hours per day and then be reassessed. If the score continues to be > 6.5 points from baseline at reassessment, we will initiate lymphedema treatment. Assessing in this manner has a 95% rate of preventing clinically significant lymphedema. ? ? L-DEX FLOWSHEETS - 11/12/21 1100   ? ?  ? L-DEX LYMPHEDEMA SCREENING  ? Measurement Type Unilateral   ? L-DEX MEASUREMENT EXTREMITY Upper Extremity   right  ? POSITION  Standing   ? DOMINANT SIDE Right   ? At Risk Side Right   ? BASELINE SCORE (UNILATERAL) -0.8   ? ?  ?  ? ?  ? ? ? ?QUICK DASH SURVEY: 0% ? ? ?PATIENT EDUCATION:  ?Education details: Lymphedema risk reduction and post op shoulder/posture HEP ?Person educated: Patient ?Education method: Explanation, Demonstration, Handout ?Education comprehension: Patient verbalized understanding and returned demonstration ? ? ?HOME EXERCISE PROGRAM: ?Patient was instructed today in a home exercise program today for post op shoulder range of motion. These included active assist shoulder flexion in sitting, scapular retraction, wall walking with shoulder abduction, and hands behind head external rotation.  She was encouraged to do these twice a day, holding 3 seconds and repeating 5 times when permitted by her physician. ? ? ?ASSESSMENT: ? ?CLINICAL IMPRESSION: ?Her multidisciplinary medical team met prior to her assessments to determine a recommended treatment plan. She is unsure what type of surgery she will have. She is pending a breast MRI and oncology visit. She will benefit from a post op PT reassessment to determine needs and  from L-Dex screens every 3 months for 2  years to detect subclinical lymphedema. ? ?Pt will benefit from skilled therapeutic intervention to improve on the following deficits: Decreased knowledge of precautions, impaired UE functional use, pain, decreased ROM, postural dysfunction.  ? ?PT treatment/interventions: ADL/self-care home management, pt/family education, therapeutic exercise ? ?REHAB POTENTIAL: Good ? ?CLINICAL DECISION MAKING: Stable/uncomplicated ? ?EVALUATION COMPLEXITY: Low ? ? ?GOALS: ?Goals reviewed with patient? YES ? ?LONG TERM GOALS: (STG=LTG) ? ? Name Target Date Goal status  ?1 Pt will be able to verbalize understanding of pertinent lymphedema risk reduction practices relevant to her dx specifically related to skin care.  ?Baseline:  No knowledge 11/12/2021 Achieved at eval  ?2 Pt will be able to return demo and/or verbalize understanding of the post op HEP related to regaining shoulder ROM. ?Baseline:  No knowledge 11/12/2021 Achieved at eval  ?3 Pt will be able to verbalize understanding of the importance of attending the post op After Breast CA Class for further lymphedema risk reduction education and therapeutic exercise.  ?Baseline:  No knowledge 11/12/2021 Achieved at eval  ?4 Pt will demo she has regained full shoulder ROM and function post operatively compared to baselines.  ?Baseline: See objective measurements taken today. 01/07/2022   ? ? ? ?PLAN: ?PT FREQUENCY/DURATION: EVAL and 1 follow up appointment.  ? ?PLAN FOR NEXT SESSION: will reassess 3-4 weeks post op to determine needs. ?  ?Patient will follow up at outpatient cancer rehab 3-4 weeks following surgery.  If the patient requires physical therapy at that time, a specific plan will be dictated and sent to the referring physician for approval. ?The patient was educated today on appropriate basic range of motion exercises to begin post operatively and the importance of attending the After Breast Cancer class following surgery.  Patient was educated today on lymphedema risk  reduction practices as it pertains to recommendations that will benefit the patient immediately following surgery.  She verbalized good understanding.   ? ?Physical Therapy Information for After Breast Cance

## 2021-11-12 ENCOUNTER — Inpatient Hospital Stay: Payer: Medicare PPO | Attending: Hematology and Oncology | Admitting: Hematology and Oncology

## 2021-11-12 ENCOUNTER — Other Ambulatory Visit: Payer: Self-pay

## 2021-11-12 ENCOUNTER — Telehealth: Payer: Self-pay | Admitting: Hematology and Oncology

## 2021-11-12 ENCOUNTER — Ambulatory Visit: Payer: Medicare PPO | Attending: General Surgery

## 2021-11-12 DIAGNOSIS — R293 Abnormal posture: Secondary | ICD-10-CM | POA: Insufficient documentation

## 2021-11-12 DIAGNOSIS — Z170 Estrogen receptor positive status [ER+]: Secondary | ICD-10-CM | POA: Diagnosis not present

## 2021-11-12 DIAGNOSIS — Z90710 Acquired absence of both cervix and uterus: Secondary | ICD-10-CM | POA: Diagnosis not present

## 2021-11-12 DIAGNOSIS — C50411 Malignant neoplasm of upper-outer quadrant of right female breast: Secondary | ICD-10-CM | POA: Diagnosis not present

## 2021-11-12 DIAGNOSIS — Z803 Family history of malignant neoplasm of breast: Secondary | ICD-10-CM | POA: Diagnosis not present

## 2021-11-12 DIAGNOSIS — C50111 Malignant neoplasm of central portion of right female breast: Secondary | ICD-10-CM | POA: Diagnosis not present

## 2021-11-12 NOTE — Progress Notes (Signed)
[facility] ?CONSULT NOTE ? ?Patient Care Team: ?Asencion Noble, MD as PCP - General (Internal Medicine) ? ?CHIEF COMPLAINTS/PURPOSE OF CONSULTATION:  ?Newly diagnosed breast cancer ? ?HISTORY OF PRESENTING ILLNESS:  ?Christy Flores 71 y.o. female is here because of recent diagnosis of right breast cancer.  She had a routine screening mammogram that detected 3 abnormalities in the right breast.  The 12 o'clock position abnormality measured 0.7 cm on biopsy came back as grade 1 invasive ductal carcinoma with ALH that was ER/PR positive and HER2 negative with a Ki-67 less than 1%.  The other 2 biopsies showed fibrocystic changes as well as focal ADH and ALH respectively. ? ?I reviewed her records extensively and collaborated the history with the patient. ? ?SUMMARY OF ONCOLOGIC HISTORY: ?Oncology History  ?Malignant neoplasm of upper-outer quadrant of right breast in female, estrogen receptor positive ([place])  ?10/23/2021 Initial Diagnosis  ? Screening mammogram detected abnormalities in the right breast: 12 o'clock position 0.8 cm: Biopsy: Grade 1 IDC with ALH ER 90%, PR 95%, Ki-67 less than 1%, HER2 1+, the other 2 biopsies were benign (fibrocystic change and focal ADH and ALH) ?  ? ? ? ?MEDICAL HISTORY:  ?Past Medical History:  ?Diagnosis Date  ? GERD (gastroesophageal reflux disease)   ? Senile osteoporosis   ? Sleep apnea   ? on CPAP  ? ? ?SURGICAL HISTORY: ?Past Surgical History:  ?Procedure Laterality Date  ? ABDOMINAL HYSTERECTOMY    ? BACK SURGERY    ? RHINOPLASTY    ? TUBAL LIGATION    ? ? ?SOCIAL HISTORY: Denies any tobacco or alcohol or recreational drug use ?Social History  ? ?Socioeconomic History  ? Marital status: Married  ?  Spouse name: Not on file  ? Number of children: Not on file  ? Years of education: Not on file  ? Highest education level: Not on file  ?Occupational History  ? Not on file  ?Tobacco Use  ? Smoking status: Never  ? Smokeless tobacco: Never  ?Vaping Use  ? Vaping Use:  Never used  ?Substance and Sexual Activity  ? Alcohol use: Not Currently  ? Drug use: Never  ? Sexual activity: Not on file  ?Other Topics Concern  ? Not on file  ?Social History Narrative  ? Not on file  ? ?Social Determinants of Health  ? ?Financial Resource Strain: Not on file  ?Food Insecurity: Not on file  ?Transportation Needs: Not on file  ?Physical Activity: Not on file  ?Stress: Not on file  ?Social Connections: Not on file  ?Intimate Partner Violence: Not on file  ? ? ?FAMILY HISTORY: ?Extensive family history of breast cancer.  Paternal grand mother, 5 aunts on the maternal side and her sister had breast cancer ? ?ALLERGIES:  is allergic to codeine. ? ?MEDICATIONS:  ?Current Outpatient Medications  ?Medication Sig Dispense Refill  ? calcium-vitamin D 250-100 MG-UNIT per tablet Take 1 tablet by mouth 2 (two) times daily.    ? cholecalciferol (VITAMIN D) 1000 UNITS tablet Take 1,000 Units by mouth daily.    ? citalopram (CELEXA) 20 MG tablet Take 20 mg by mouth daily.    ? denosumab (PROLIA) 60 MG/ML SOSY injection Inject 60 mg into the skin every 6 (six) months.    ? Multiple Vitamins-Minerals (MULTIVITAMIN WITH MINERALS) tablet Take 1 tablet by mouth daily.    ? vitamin E 400 UNIT capsule Take 400 Units by mouth daily.    ? ?No current facility-administered medications  for this visit.  ? ? ?REVIEW OF SYSTEMS:   ?Constitutional: Denies fevers, chills or abnormal night sweats ?  ?All other systems were reviewed with the patient and are negative. ? ?PHYSICAL EXAMINATION: ?ECOG PERFORMANCE STATUS: 1 - Symptomatic but completely ambulatory ? ?Vitals:  ? 11/12/21 1214  ?BP: 137/81  ?Pulse: 80  ?Resp: 18  ?Temp: (!) 97.3 ?F (36.3 ?C)  ?SpO2: 94%  ? ?Filed Weights  ? 11/12/21 1214  ?Weight: 186 lb 9.6 oz (84.6 kg)  ? ? ?  ? ?LABORATORY DATA:  ?I have reviewed the data as listed ?Lab Results  ?Component Value Date  ? WBC 9.0 05/24/2008  ? HGB 13.9 06/25/2020  ? HCT 41.0 06/25/2020  ? MCV 99.0 05/24/2008  ? PLT 202  DELTA CHECK NOTED 05/24/2008  ? ?Lab Results  ?Component Value Date  ? NA 139 06/25/2020  ? K 4.3 06/25/2020  ? CL 103 06/25/2020  ? CO2 29 05/23/2008  ? ? ?RADIOGRAPHIC STUDIES: ?I have personally reviewed the radiological reports and agreed with the findings in the report. ? ?ASSESSMENT AND PLAN:  ?Malignant neoplasm of upper-outer quadrant of right breast in female, estrogen receptor positive ([place]) ?10/23/2021:Screening mammogram detected abnormalities in the right breast: 12 o'clock position 0.8 cm: Biopsy: Grade 1 IDC with ALH ER 90%, PR 95%, Ki-67 less than 1%, HER2 1+, the other 2 biopsies were benign (fibrocystic change and focal ADH and ALH) ? ?Pathology and radiology counseling:Discussed with the patient, the details of pathology including the type of breast cancer,the clinical staging, the significance of ER, PR and HER-2/neu receptors and the implications for treatment. After reviewing the pathology in detail, we proceeded to discuss the different treatment options between surgery, radiation, chemotherapy, antiestrogen therapies. ? ?Recommendations: ?1. Breast conserving surgery followed by ?2.Adjuvant radiation therapy followed by ?3. Adjuvant antiestrogen therapy ? ?Patient is getting a breast MRI to determine if she needs more extensive surgery. ?I discussed with her that unless her tumor is greater than 1 cm or higher grade she would not need chemotherapy.  Therefore Oncotype is not planned to be sent.  We will await to see the final pathology before deciding on Oncotype. ? ? ?Return to clinic after surgery to discuss final pathology report ? ?All questions were answered. The patient knows to call the clinic with any problems, questions or concerns. ?  ? Harriette Ohara, MD ?11/12/21 ? ?

## 2021-11-12 NOTE — Assessment & Plan Note (Addendum)
10/23/2021:Screening mammogram detected abnormalities in the right breast: 12 o'clock position 0.7 cm: Biopsy: Grade 1 IDC with ALH ER 90%, PR 95%, Ki-67 less than 1%, HER2 1+, the other 2 biopsies were benign (fibrocystic change and focal ADH and ALH) ? ?Pathology and radiology counseling:Discussed with the patient, the details of pathology including the type of breast cancer,the clinical staging, the significance of ER, PR and HER-2/neu receptors and the implications for treatment. After reviewing the pathology in detail, we proceeded to discuss the different treatment options between surgery, radiation, chemotherapy, antiestrogen therapies. ? ?Recommendations: ?1. Breast conserving surgery followed by ?2.Adjuvant radiation therapy followed by ?3. Adjuvant antiestrogen therapy ? ?Patient is getting a breast MRI to determine if she needs more extensive surgery. ?I discussed with her that unless her tumor is greater than 1 cm or higher grade she would not need chemotherapy.  Therefore Oncotype is not planned to be sent.  We will await to see the final pathology before deciding on Oncotype. ? ? ?Return to clinic after surgery to discuss final pathology report ?

## 2021-11-12 NOTE — Telephone Encounter (Signed)
Scheduled appt per 5/5 referral. Pt is aware of appt date and time. Pt is aware to arrive 15 mins prior to appt time and to bring and updated insurance card. Pt is aware of appt location.   ?

## 2021-11-14 ENCOUNTER — Telehealth: Payer: Self-pay | Admitting: Radiation Oncology

## 2021-11-14 ENCOUNTER — Ambulatory Visit
Admission: RE | Admit: 2021-11-14 | Discharge: 2021-11-14 | Disposition: A | Discharge status: Home / Self Care | Payer: Medicare PPO | Source: Ambulatory Visit | Attending: General Surgery | Admitting: General Surgery

## 2021-11-14 ENCOUNTER — Other Ambulatory Visit: Payer: Self-pay | Admitting: *Deleted

## 2021-11-14 DIAGNOSIS — C50411 Malignant neoplasm of upper-outer quadrant of right female breast: Secondary | ICD-10-CM | POA: Diagnosis not present

## 2021-11-14 DIAGNOSIS — Z170 Estrogen receptor positive status [ER+]: Secondary | ICD-10-CM

## 2021-11-14 MED ORDER — GADOBUTROL 1 MMOL/ML IV SOLN
9.0000 mL | Freq: Once | INTRAVENOUS | Status: AC | PRN
  Administered 2021-11-14: 9 mL via INTRAVENOUS

## 2021-11-14 NOTE — Telephone Encounter (Signed)
5/11 @ 11:46 am Left voicemail for patient to call our office.   ?

## 2021-11-18 ENCOUNTER — Encounter: Payer: Self-pay | Admitting: *Deleted

## 2021-11-19 ENCOUNTER — Other Ambulatory Visit: Payer: Self-pay | Admitting: General Surgery

## 2021-11-19 ENCOUNTER — Encounter: Payer: Self-pay | Admitting: *Deleted

## 2021-11-19 DIAGNOSIS — R9389 Abnormal findings on diagnostic imaging of other specified body structures: Secondary | ICD-10-CM

## 2021-11-19 NOTE — Progress Notes (Incomplete)
Location of Breast Cancer:  ?Malignant neoplasm of upper-outer quadrant of right breast in female, estrogen receptor positive  ? ?Histology per Pathology Report:  ?(Definitive pathology pending future surgery) ?11/06/2021 ?1. Breast, right, needle core biopsy, upper ?- FIBROCYSTIC CHANGES WITH CALCIFICATIONS. ?- NEGATIVE FOR MALIGNANCY. ?2. Breast, right, needle core biopsy, central ?- FIBROCYSTIC CHANGES WITH FOCAL ATYPICAL DUCTAL HYPERPLASIA AND CALCIFICATIONS. ?- ATYPICAL LOBULAR HYPERPLASIA. ?- NEGATIVE FOR INVASIVE CARCINOMA. ? ?10/23/2021 ?FINAL MICROSCOPIC DIAGNOSIS:  ?A. BREAST, 12:00, RIGHT, BIOPSY:  ?- Invasive ductal carcinoma, grade 1.  ?- Focal atypical lobular hyperplasia (ALH).  ?- See comment.  ?COMMENT:  ?The greatest tumor dimension is 0.6 cm.  Breast prognostic profile will be performed.  ? ?Receptor Status: ER(90%), PR (95%), Her2-neu (Negative), Ki-67(<1%) ? ?Did patient present with symptoms (if so, please note symptoms) or was this found on screening mammography?: Patient had some breast pain. She underwent mammography. She had C density breast tissue. She had a possible right breast mass and some calcifications noted on her screening mammogram. ? ?Past/Anticipated interventions by surgeon, if any:  ?11/08/2021 ?--Dr. Rolm Bookbinder (office visit) ?MRI bilateral breast ?Genetic testing ?Bracketed lumpectomy vs mastectomy ?sn biopsy with magtrace ?With her dense breast, family history, and possible genetic mutation I think it is very reasonable to obtain an MRI of her bilateral breast.  ?I am concerned that this is 1 larger process with 2 abnormal biopsies on this side.  ?This would certainly aid the decision in lumpectomy versus mastectomy for her.  ?We discussed MRI as well as the possible need for additional biopsies and she is agreeable to proceed with this.  ?I think this is an important part of her evaluation presurgical. ?We also discussed genetic testing. She is certainly a candidate  I am not sure what abnormality her sister had.  ?She agreed to proceed with testing today and I did send that off. ?We discussed the staging and pathophysiology of breast cancer.  ?We discussed all of the different options for treatment for breast cancer including surgery, chemotherapy, radiation therapy, Herceptin, and antiestrogen therapy. ?We discussed a sentinel lymph node biopsy as she does not appear to having lymph node involvement right now.  ?We discussed the options for treatment of the breast cancer which included lumpectomy versus a mastectomy.  ?She understands her further therapy will be based on what her stages at the time of her operation. ? ?Past/Anticipated interventions by medical oncology, if any:  ?Under care of Dr. Nicholas Lose ?11/12/2021 ?--Recommendations: ?1. Breast conserving surgery followed by ?2. Adjuvant radiation therapy followed by ?3. Adjuvant antiestrogen therapy ?Patient is getting a breast MRI to determine if she needs more extensive surgery. ?I discussed with her that unless her tumor is greater than 1 cm or higher grade she would not need chemotherapy.   ?Therefore Oncotype is not planned to be sent.   ?We will await to see the final pathology before deciding on Oncotype. ?Return to clinic after surgery to discuss final pathology report ? ?Lymphedema issues, if any:  ***   ? ?Pain issues, if any:  ***  ? ?SAFETY ISSUES: ?Prior radiation? *** ?Pacemaker/ICD? *** ?Possible current pregnancy? No--hysterectomy ?Is the patient on methotrexate? *** ? ?Current Complaints / other details:  *** ?   ? ? ? ?

## 2021-11-19 NOTE — Progress Notes (Signed)
Radiation Oncology         [phone] ________________________________  Initial Outpatient Consultation  Name: Christy Flores MRN: [id number]  Date: 11/20/2021  DOB: 1950-11-22  QQ:[id number], Carloyn Manner, MD  Nicholas Lose, MD   REFERRING PHYSICIAN: Nicholas Lose, MD  DIAGNOSIS:    ICD-10-CM   1. Malignant neoplasm of upper-outer quadrant of right breast in female, estrogen receptor positive ([place])  C50.411    Z17.0       Right Breast UOQ Invasive Ductal Carcinoma with focal atypical lobular hyperplasia, ER+ / PR+ / Her2-, Grade 1   Cancer Staging  Malignant neoplasm of upper-outer quadrant of right breast in female, estrogen receptor positive ([place]) Staging form: Breast, AJCC 8th Edition - Clinical: Stage IA (cT1b, cN0, cM0, G1, ER+, PR+, HER2-) - Signed by Nicholas Lose, MD on 11/12/2021  CHIEF COMPLAINT: Here to discuss management of right breast cancer  HISTORY OF PRESENT ILLNESS::Christy Flores is a 71 y.o. female who presented with a right breast abnormality on the following imaging: bilateral screening mammogram on the date of 10/07/21.  No symptoms, if any, were reported at that time. Diagnostic right breast mammogram and right breast ultrasound on 10/22/21 further revealed: an irregular shadowing mass in the right breast at the 12 o'clock retroareolar/periareolar position measuring 0.7 x 0.7 x 0.8 cm; an indeterminate 0.3 cm group of round and punctate calcifications in the upper central right breast; and an indeterminate 0.7 cm group of linear calcifications in the slightly inner right breast. No axillary adenopathy was appreciated bilaterally. (C density breast tissue also noted on diagnostic imaging).   Biopsy of the 12 o'clock right breast on date of 10/23/21 showed grade 1 invasive ductal carcinoma measuring 0.6 cm in the greatest tumor dimension, with focal atypical lobular hyperplasia.  ER status: 90% positive with moderate-strong staining intensity; PR status 95% positive with  strong staining intensity; Proliferation marker Ki67 at <1%; Her2 status negative; Grade 1. No lymph nodes were examined.   Biopsy of the upper right breast (3 mm grouped calcifications) on 11/06/21 showed fibrocystic changes with calcifications and no evidence of malignancy. Biopsy of the central right breast (7 mm grouped calcifications) showed no evidence of malignancy, with fibrocystic changes, focal stypical ductal hyperplasia, calcifications, and atypical lobular hyperplasia.  Accordingly, the patient was referred to Dr. Donne Hazel on 11/08/21 to discuss treatment options. Given her dense breasts, family history (noted below), and possible genetic mutation, Dr. Donne Hazel recommended proceeding with genetic testing and an MRI. Dr. Donne Hazel also noted concern in there being 1 larger process with 2 abnormal biopsies in her right breast, which will be a determining factor in her choice of lumpectomy versus mastectomy. He also informed the patient that she may possibly need additional biopsies which she is agreeable to proceed with.   Bilateral breast MRI on 11/14/21 showed the biopsy-proven invasive carcinoma within the slightly upper-outer right breast, located at the 12-1 o'clock axis, measuring 1.3 cm. MRI also showed the linear clumped non-mass enhancement within the slightly inner right breast, measuring 3.4 cm in extent from the anterior-to-middle depth, and middle-to-posterior depth. This corresponded with the site of biopsy proven atypical ductal/lobular hyperplasia. No evidence of additional multifocal or multicentric disease was appreciated within the right breast, contralateral disease within the left breast, or abnormal appearing lymph nodes.  The patient was referred to Dr. Lindi Adie on 11/12/21. Dr. Lindi Adie informed that patient that there is no role for chemotherapy unless final pathology from breast conserving surgery reveals the tumor to be  greater than 1 cm or a higher grade.  Given so, Dr.  Lindi Adie would like to wait for final pathology before deciding on Oncotype testing. Regardless, the patient is agreeable to proceed with antiestrogen therapy following XRT.   Of note: the patient has a significant family history of cancer, including: a sister with breast cancer in her 75's, a maternal grandmother and 5 maternal aunts with breast cancer, ovarian cancer in her mother, 1 brother and 1 sister with colon cancer, and melanoma in her father and brother.  Results of genetic testing are pending. 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  PREVIOUS RADIATION THERAPY: No  PAST MEDICAL HISTORY:  has a past medical history of GERD (gastroesophageal reflux disease), Senile osteoporosis, and Sleep apnea.    PAST SURGICAL HISTORY: Past Surgical History:  Procedure Laterality Date   ABDOMINAL HYSTERECTOMY     BACK SURGERY     RHINOPLASTY     TUBAL LIGATION      FAMILY HISTORY: family history is not on file.  SOCIAL HISTORY:  reports that she has never smoked. She has never used smokeless tobacco. She reports that she does not currently use alcohol. She reports that she does not use drugs.  ALLERGIES: Codeine  MEDICATIONS:  Current Outpatient Medications  Medication Sig Dispense Refill   calcium-vitamin D 250-100 MG-UNIT per tablet Take 1 tablet by mouth 2 (two) times daily.     cholecalciferol (VITAMIN D) 1000 UNITS tablet Take 1,000 Units by mouth daily.     citalopram (CELEXA) 20 MG tablet Take 20 mg by mouth daily.     denosumab (PROLIA) 60 MG/ML SOSY injection Inject 60 mg into the skin every 6 (six) months.     Multiple Vitamins-Minerals (MULTIVITAMIN WITH MINERALS) tablet Take 1 tablet by mouth daily.     vitamin E 400 UNIT capsule Take 400 Units by mouth daily. (Patient not taking: Reported on 11/20/2021)     No current facility-administered medications for this encounter.    REVIEW OF SYSTEMS: As above in HPI.   PHYSICAL EXAM:  weight is 189 lb 9 oz  (86 kg). Her temperature is 97.2 F (36.2 C) (abnormal). Her blood pressure is 128/78 and her pulse is 67. Her respiration is 18 and oxygen saturation is 100%.   General: Alert and oriented, in no acute distress HEENT: Head is normocephalic. Extraocular movements are intact.  Heart: Regular in rate and rhythm with no murmurs, rubs, or gallops. Chest: Clear to auscultation bilaterally, with no rhonchi, wheezes, or rales. Skin: No concerning lesions. Musculoskeletal: symmetric strength and muscle tone throughout. Neurologic:  No obvious focalities. Speech is fluent. Coordination is intact. Psychiatric: Judgment and insight are intact. Affect is appropriate. Breasts: post bx thickening in right breast . No other palpable masses appreciated in the breasts or axillae otherwise .   ECOG = 0  0 - Asymptomatic (Fully active, able to carry on all predisease activities without restriction)  1 - Symptomatic but completely ambulatory (Restricted in physically strenuous activity but ambulatory and able to carry out work of a light or sedentary nature. For example, light housework, office work)  2 - Symptomatic, <50% in bed during the day (Ambulatory and capable of all self care but unable to carry out any work activities. Up and about more than 50% of waking hours)  3 - Symptomatic, >50% in bed, but not bedbound (Capable of only limited self-care, confined to bed or chair 50% or more of waking hours)  4 - Bedbound (Completely disabled. Cannot carry on any self-care. Totally confined to bed or chair)  5 - Death   Eustace Pen MM, Creech RH, Tormey DC, et al. [phone]). "Toxicity and response criteria of the [facility]". [place] Oncol. 5 (6): 649-55   LABORATORY DATA:  Lab Results  Component Value Date   WBC 9.0 05/24/2008   HGB 13.9 06/25/2020   HCT 41.0 06/25/2020   MCV 99.0 05/24/2008   PLT 202 DELTA CHECK NOTED 05/24/2008   CMP     Component Value Date/Time   NA 139  06/25/2020 1240   K 4.3 06/25/2020 1240   CL 103 06/25/2020 1240   CO2 29 05/23/2008 1145   GLUCOSE 98 06/25/2020 1240   BUN 14 06/25/2020 1240   CREATININE 0.70 06/25/2020 1240   CALCIUM 9.2 05/23/2008 1145   GFRNONAA >60 05/23/2008 1145   GFRAA  05/23/2008 1145    >60        The eGFR has been calculated using the MDRD equation. This calculation has not been validated in all clinical         RADIOGRAPHY: MR BREAST BILATERAL W [place] CAD  Result Date: 11/15/2021 CLINICAL DATA:  Recent biopsy-proven invasive ductal carcinoma (grade 1) with focal ALH in the RIGHT breast at the 12 o'clock axis, via ultrasound-guided biopsy performed on 10/23/2021 (ribbon clip). Subsequent stereotactic biopsy for calcifications in the central RIGHT breast on 11/06/2021 revealed focal ADH and ALH (coil clip). Additional stereotactic biopsy for calcifications in the upper RIGHT breast revealed benign fibrocystic changes. EXAM: BILATERAL BREAST MRI WITH AND WITHOUT CONTRAST TECHNIQUE: Multiplanar, multisequence MR images of both breasts were obtained prior to and following the intravenous administration of 9 ml of Gadavist Three-dimensional MR images were rendered by post-processing of the original MR data on an independent workstation. The three-dimensional MR images were interpreted, and findings are reported in the following complete MRI report for this study. Three dimensional images were evaluated at the independent interpreting workstation using the DynaCAD thin client. COMPARISON:  Previous exams including diagnostic mammogram and ultrasound dated 10/22/2021. No previous breast MRI. FINDINGS: Breast composition: c. Heterogeneous fibroglandular tissue. Background parenchymal enhancement: Mild Right breast: Irregular enhancing mass within the slightly upper slightly outer RIGHT breast, 12-1 o'clock axis (described as 12 o'clock axis on earlier ultrasound and ultrasound-guided biopsy), measuring 1.3 cm,  with associated biopsy clip, corresponding to the biopsy-proven invasive carcinoma (series 9, image 61) (ribbon clip). Linear clumped non-mass enhancement within the slightly inner RIGHT breast, extending from anterior-to-middle depth to middle-to-posterior depth, measuring 3.4 cm extent, with associated biopsy clip artifact at its anterior-inferior extent, corresponding to the site of biopsy-proven ADH (series 9, images 58-76) (coil clip). Third biopsy site with associated enhancement within the posterior RIGHT breast corresponds to the benign stereotactic biopsy site revealing fibrocystic change (X clip). No additional enhancing mass, suspicious non-mass enhancement or secondary signs of malignancy elsewhere within the RIGHT breast. Left breast: No suspicious enhancing mass, suspicious non-mass enhancement or secondary signs of malignancy within the LEFT breast. Incidental note made a benign-appearing intramammary lymph node within the outer posterior LEFT breast (series 9, image 63). Lymph nodes: No abnormal appearing lymph nodes. Ancillary findings:  None. IMPRESSION: 1. Biopsy-proven invasive carcinoma within the slightly upper slightly outer RIGHT breast, 12-1 o'clock axis (described as 12 o'clock axis on earlier ultrasound and ultrasound-guided biopsy reports), measuring 1.3 cm by MRI, with associated biopsy clip. 2. Linear clumped non-mass enhancement within the slightly inner  RIGHT breast, measuring 3.4 cm extent from anterior-to-middle depth to middle-to-posterior depth, with associated biopsy clip artifact at its anterior-inferior extent, corresponding to the site of biopsy-proven ADH. The enhancement extends approximately 3 cm posterior-superior-medial to the biopsy clip. Consider additional MRI-guided biopsy for this most peripheral portion of the enhancement away from the biopsy clip. At minimum, recommend adequate surgical excision to include this posterior-superior-medial extent. 3. No evidence of  additional multifocal or multicentric disease within the RIGHT breast. 4. No evidence of contralateral disease within the LEFT breast. RECOMMENDATION: 1. Per treatment plan for patient's biopsy-proven invasive carcinoma and ADH within the RIGHT breast. 2. As above, there is fairly prominent extent of the non-mass enhancement posterior-superior-medial to the biopsy clip corresponding to the site of ADH. Again, as above, consider additional MRI-guided biopsy for this most peripheral portion of the enhancement away from the biopsy clip to define the extent of disease. At minimum, recommend adequate surgical excision to include this posterior-superior-medial extent. BI-RADS CATEGORY  6: Known biopsy-proven malignancy. Electronically Signed   By: Franki Cabot M.D.   On: 11/15/2021 11:44  MM CLIP PLACEMENT RIGHT  Result Date: 11/06/2021 CLINICAL DATA:  Evaluate placement of X and COIL biopsy marker clips following 3D/stereotactic guided RIGHT breast biopsies. EXAM: 3D DIAGNOSTIC RIGHT MAMMOGRAM POST STEREOTACTIC BIOPSY COMPARISON:  None Available. FINDINGS: 3D Mammographic images were obtained following 3D/stereotactic guided guided biopsy of a 0.3 cm group of UPPER RIGHT breast calcifications (X clip) and a 0.7 cm group of central RIGHT breast calcifications (COIL clip). The X biopsy marking clip is located 2 cm INFERIOR to the expected position of biopsied calcifications. The COIL marking clip is in satisfactory position. IMPRESSION: 2 cm INFERIOR X clip migration from expected position of biopsied UPPER RIGHT breast calcifications. Appropriate positioning of the COIL shaped biopsy marking clip at the site of biopsy in the central RIGHT breast. Final Assessment: Post Procedure Mammograms for Marker Placement Electronically Signed   By: Margarette Canada M.D.   On: 11/06/2021 12:22  MM CLIP PLACEMENT RIGHT  Result Date: 10/23/2021 CLINICAL DATA:  Status post ultrasound-guided core needle biopsy of an 8 mm mass in the  12 o'clock retroareolar/periareolar right breast. EXAM: 3D DIAGNOSTIC RIGHT MAMMOGRAM POST ULTRASOUND BIOPSY COMPARISON:  Previous exam(s). FINDINGS: 3D Mammographic images were obtained following ultrasound guided biopsy of the right breast. The biopsy marking clip is in expected position at the site of biopsy. IMPRESSION: Appropriate positioning of the ribbon shaped biopsy marking clip at the site of biopsy in the upper, slightly outer right breast anteriorly at the medial aspect of the area of focal distortion seen mammographically. Final Assessment: Post Procedure Mammograms for Marker Placement Electronically Signed   By: Claudie Revering M.D.   On: 10/23/2021 13:59  MM RT BREAST BX W LOC DEV 1ST LESION IMAGE BX SPEC STEREO GUIDE  Addendum Date: 11/07/2021   ADDENDUM REPORT: 11/07/2021 12:06 ADDENDUM: Pathology revealed FIBROCYSTIC CHANGES WITH CALCIFICATIONS- NEGATIVE FOR MALIGNANCY of the upper RIGHT breast calcifications (X clip). This was found to be concordant by Dr. Hassan Rowan. Pathology revealed FIBROCYSTIC CHANGES WITH FOCAL ATYPICAL DUCTAL HYPERPLASIA AND CALCIFICATIONS- ATYPICAL LOBULAR HYPERPLASIA- NEGATIVE FOR INVASIVE CARCINOMA of the central RIGHT breast calcifications (coil clip). This was found to be concordant by Dr. Hassan Rowan, with excision recommended. Pathology results were discussed with the patient by telephone. The patient reported doing well after the biopsies with tenderness at the sites. Post biopsy instructions and care were reviewed and questions were answered. The patient was encouraged  to call [facility] for any additional concerns. Per patient, surgical consultation has been arranged with Dr. Rolm Bookbinder at [facility] on Nov 08, 2021. Pathology results reported by Stacie Acres RN on 11/07/2021. Electronically Signed   By: Margarette Canada M.D.   On: 11/07/2021 12:06   Result Date: 11/07/2021 CLINICAL DATA:  71 year old female with recently  diagnosed RIGHT breast cancer. Tissue sampling of 0.3 cm group of UPPER RIGHT breast calcifications and 0.7 cm group of central RIGHT breast calcifications. EXAM: RIGHT BREAST STEREOTACTIC CORE NEEDLE BIOPSY X 2 COMPARISON:  None Available. FINDINGS: The patient and I discussed the procedure of stereotactic-guided biopsy including benefits and alternatives. We discussed the high likelihood of a successful procedure. We discussed the risks of the procedure including infection, bleeding, tissue injury, clip migration, and inadequate sampling. Informed written consent was given. The usual time out protocol was performed immediately prior to the procedure. RIGHT BREAST STEREOTACTIC CORE NEEDLE BIOPSY #1 (0.3 cm group of UPPER RIGHT breast calcifications-X clip): Using sterile technique and 1% Lidocaine with and without epinephrine as local anesthetic, under stereotactic guidance, a 9 gauge vacuum assisted device was used to perform core needle biopsy of 0.3 cm group of UPPER RIGHT breast calcifications using a SUPERIOR approach. Specimen radiograph was performed showing calcifications. Specimens with calcifications are identified for pathology. At the conclusion of the procedure, an X shaped tissue marker clip was deployed into the biopsy cavity. Follow-up 2-view mammogram was performed and dictated separately. RIGHT BREAST STEREOTACTIC CORE NEEDLE BIOPSY #2 (0.7 cm group of central RIGHT breast calcifications-COIL clip): Using sterile technique and 1% Lidocaine with and without epinephrine as local anesthetic, under stereotactic guidance, a 9 gauge vacuum assisted device was used to perform core needle biopsy of 0.7 cm group of central RIGHT breast calcifications using a SUPERIOR approach. Specimen radiograph was performed showing calcification. Specimens with calcifications are identified for pathology. At the conclusion of the procedure, a COIL shaped tissue marker clip was deployed into the biopsy cavity. Follow-up  2-view mammogram was performed and dictated separately. IMPRESSION: Stereotactic-guided biopsy of 0.3 cm group of UPPER RIGHT breast calcifications-X clip. Stereotactic guided biopsy of 0.7 cm group of central RIGHT breast calcifications-COIL clip. No apparent complications. Electronically Signed: By: Margarette Canada M.D. On: 11/06/2021 11:00  MM RT BREAST BX W LOC DEV EA AD LESION IMG BX SPEC STEREO GUIDE  Addendum Date: 11/07/2021   ADDENDUM REPORT: 11/07/2021 12:06 ADDENDUM: Pathology revealed FIBROCYSTIC CHANGES WITH CALCIFICATIONS- NEGATIVE FOR MALIGNANCY of the upper RIGHT breast calcifications (X clip). This was found to be concordant by Dr. Hassan Rowan. Pathology revealed FIBROCYSTIC CHANGES WITH FOCAL ATYPICAL DUCTAL HYPERPLASIA AND CALCIFICATIONS- ATYPICAL LOBULAR HYPERPLASIA- NEGATIVE FOR INVASIVE CARCINOMA of the central RIGHT breast calcifications (coil clip). This was found to be concordant by Dr. Hassan Rowan, with excision recommended. Pathology results were discussed with the patient by telephone. The patient reported doing well after the biopsies with tenderness at the sites. Post biopsy instructions and care were reviewed and questions were answered. The patient was encouraged to call [facility] for any additional concerns. Per patient, surgical consultation has been arranged with Dr. Rolm Bookbinder at [facility] on Nov 08, 2021. Pathology results reported by Stacie Acres RN on 11/07/2021. Electronically Signed   By: Margarette Canada M.D.   On: 11/07/2021 12:06   Result Date: 11/07/2021 CLINICAL DATA:  71 year old female with recently diagnosed RIGHT breast cancer. Tissue sampling of 0.3  cm group of UPPER RIGHT breast calcifications and 0.7 cm group of central RIGHT breast calcifications. EXAM: RIGHT BREAST STEREOTACTIC CORE NEEDLE BIOPSY X 2 COMPARISON:  None Available. FINDINGS: The patient and I discussed the procedure of stereotactic-guided biopsy including benefits  and alternatives. We discussed the high likelihood of a successful procedure. We discussed the risks of the procedure including infection, bleeding, tissue injury, clip migration, and inadequate sampling. Informed written consent was given. The usual time out protocol was performed immediately prior to the procedure. RIGHT BREAST STEREOTACTIC CORE NEEDLE BIOPSY #1 (0.3 cm group of UPPER RIGHT breast calcifications-X clip): Using sterile technique and 1% Lidocaine with and without epinephrine as local anesthetic, under stereotactic guidance, a 9 gauge vacuum assisted device was used to perform core needle biopsy of 0.3 cm group of UPPER RIGHT breast calcifications using a SUPERIOR approach. Specimen radiograph was performed showing calcifications. Specimens with calcifications are identified for pathology. At the conclusion of the procedure, an X shaped tissue marker clip was deployed into the biopsy cavity. Follow-up 2-view mammogram was performed and dictated separately. RIGHT BREAST STEREOTACTIC CORE NEEDLE BIOPSY #2 (0.7 cm group of central RIGHT breast calcifications-COIL clip): Using sterile technique and 1% Lidocaine with and without epinephrine as local anesthetic, under stereotactic guidance, a 9 gauge vacuum assisted device was used to perform core needle biopsy of 0.7 cm group of central RIGHT breast calcifications using a SUPERIOR approach. Specimen radiograph was performed showing calcification. Specimens with calcifications are identified for pathology. At the conclusion of the procedure, a COIL shaped tissue marker clip was deployed into the biopsy cavity. Follow-up 2-view mammogram was performed and dictated separately. IMPRESSION: Stereotactic-guided biopsy of 0.3 cm group of UPPER RIGHT breast calcifications-X clip. Stereotactic guided biopsy of 0.7 cm group of central RIGHT breast calcifications-COIL clip. No apparent complications. Electronically Signed: By: Margarette Canada M.D. On: 11/06/2021  11:00  [place] RT BREAST BX W LOC DEV 1ST LESION IMG BX SPEC US GUIDE  Addendum Date: 10/24/2021   ADDENDUM REPORT: 10/24/2021 15:04 ADDENDUM: PATHOLOGY revealed: A. BREAST, 12:00, RIGHT, BIOPSY: - Invasive ductal carcinoma, grade 1. - Focal atypical lobular hyperplasia (ALH). COMMENT: The greatest tumor dimension is 0.6 cm. Pathology results are CONCORDANT with imaging findings, per Dr. Claudie Revering. Pathology results and recommendations were discussed with patient via telephone on 10/24/2021. Patient reported biopsy site doing well with no adverse symptoms, and only slight tenderness at the site. Post biopsy care instructions were reviewed, questions were answered and my direct phone number was provided. Patient was instructed to call [facility] Department for any additional questions or concerns related to biopsy site. RECOMMENDATIONS: 1. Surgical consultation. Request for surgical consultation was relayed to Kathi Der RT at [facility] by Electa Sniff RN on 10/24/2021. 2. The patient is scheduled for two RIGHT breast stereotactic guided biopsies on 11/06/2021, for two groups of calcifications in the RIGHT breast. Pathology results reported by Electa Sniff RN on 10/24/2021. Electronically Signed   By: Claudie Revering M.D.   On: 10/24/2021 15:04   Result Date: 10/24/2021 CLINICAL DATA:  0.8 cm mass in the 12 o'clock retroareolar/periareolar right breast at recent mammography and ultrasound. The patient also had 2 groups of indeterminate calcifications in the right breast at mammography, scheduled for stereotactic guided core needle biopsy on 11/06/2021 at the [facility]. EXAM: ULTRASOUND GUIDED RIGHT BREAST CORE NEEDLE BIOPSY COMPARISON:  Previous exam(s). PROCEDURE: I met with the patient and we  discussed the procedure of ultrasound-guided biopsy, including benefits and alternatives. We discussed the high likelihood of a  successful procedure. We discussed the risks of the procedure, including infection, bleeding, tissue injury, clip migration, and inadequate sampling. Informed written consent was given. The usual time-out protocol was performed immediately prior to the procedure. Using sterile technique and 1% Lidocaine as local anesthetic, under direct ultrasound visualization, a 12 gauge spring-loaded device was used to perform biopsy of the recently demonstrated 0.8 cm mass in the 12 o'clock retroareolar/periareolar right breast using a medial approach. At the conclusion of the procedure a ribbon shaped tissue marker clip was deployed into the biopsy cavity. Follow up 2 view mammogram was performed and dictated separately. IMPRESSION: Ultrasound guided biopsy of a 0.8 cm mass in the 12 o'clock retroareolar/periareolar right breast. No apparent complications. Electronically Signed: By: Claudie Revering M.D. On: 10/23/2021 13:50     IMPRESSION/PLAN: This is a delightful 71 yo woman with ER+ breast cancer.  Clinical stage/extent of disease is unclear and warrants more biopsies. The results of the bx's will drive whether she has a lumpectomy or mastectomy. Also, genetic test results are pending   It was a pleasure meeting the patient today. We discussed the risks, benefits, and side effects of  post lumpectomy radiotherapy. She understands that radiotherapy would be much less likely if she undergoes mastectomy. In a post lumpectomy setting, I recommend radiotherapy to the right breast to reduce her risk of locoregional recurrence by 2/3.  We discussed that radiation would take approximately 3-4 weeks to complete and that I would give the patient a few weeks to heal following surgery before starting treatment planning. She understands that the benefit of RT is minimizing risk of an in-breast recurrence and it is unlikely to affect life expectancy.   If chemotherapy were to be given, this would precede radiotherapy.   We spoke  about RT acute effects including skin irritation and fatigue as well as much less common late effects including internal organ injury or irritation. We spoke about the latest technology that is used to minimize the risk of late effects for patients undergoing radiotherapy to the breast or chest wall. No guarantees of treatment were given. The patient is enthusiastic about proceeding with treatment. I look forward to participating in the patient's care.  I will await her referral back to me for postoperative follow-up and eventual CT simulation/treatment planning as warranted.  On date of service, in total, I spent 50 minutes on this encounter. Patient was seen in person.  Note was signed a day after visit. Minutes above pertain only to date of service.   __________________________________________   Eppie Gibson, MD  This document serves as a record of services personally performed by Eppie Gibson, MD. It was created on her behalf by Roney Mans, a trained medical scribe. The creation of this record is based on the scribe's personal observations and the provider's statements to them. This document has been checked and approved by the attending provider.

## 2021-11-20 ENCOUNTER — Encounter: Payer: Self-pay | Admitting: Radiation Oncology

## 2021-11-20 ENCOUNTER — Other Ambulatory Visit: Payer: Self-pay

## 2021-11-20 ENCOUNTER — Ambulatory Visit
Admission: RE | Admit: 2021-11-20 | Discharge: 2021-11-20 | Disposition: A | Discharge status: Home / Self Care | Payer: Medicare PPO | Source: Ambulatory Visit | Attending: Radiation Oncology | Admitting: Radiation Oncology

## 2021-11-20 VITALS — BP 128/78 | HR 67 | Temp 97.2°F | Resp 18 | Wt 189.6 lb

## 2021-11-20 DIAGNOSIS — C50411 Malignant neoplasm of upper-outer quadrant of right female breast: Secondary | ICD-10-CM | POA: Diagnosis not present

## 2021-11-20 DIAGNOSIS — Z170 Estrogen receptor positive status [ER+]: Secondary | ICD-10-CM | POA: Diagnosis not present

## 2021-11-20 NOTE — Progress Notes (Signed)
Location of Breast Cancer:  ?Malignant neoplasm of upper-outer quadrant of right breast in female, estrogen receptor positive  ? ?Histology per Pathology Report:  ?(Definitive pathology pending future surgery) ?11/06/2021 ?1. Breast, right, needle core biopsy, upper ?- FIBROCYSTIC CHANGES WITH CALCIFICATIONS. ?- NEGATIVE FOR MALIGNANCY. ?2. Breast, right, needle core biopsy, central ?- FIBROCYSTIC CHANGES WITH FOCAL ATYPICAL DUCTAL HYPERPLASIA AND CALCIFICATIONS. ?- ATYPICAL LOBULAR HYPERPLASIA. ?- NEGATIVE FOR INVASIVE CARCINOMA. ? ?10/23/2021 ?FINAL MICROSCOPIC DIAGNOSIS:  ?A. BREAST, 12:00, RIGHT, BIOPSY:  ?- Invasive ductal carcinoma, grade 1.  ?- Focal atypical lobular hyperplasia (ALH).  ?- See comment.  ?COMMENT:  ?The greatest tumor dimension is 0.6 cm.  Breast prognostic profile will be performed.  ? ?Receptor Status: ER(90%), PR (95%), Her2-neu (Negative), Ki-67(<1%) ? ?Did patient present with symptoms (if so, please note symptoms) or was this found on screening mammography?: Patient had some breast pain. She underwent mammography. She had C density breast tissue. She had a possible right breast mass and some calcifications noted on her screening mammogram. ? ?Past/Anticipated interventions by surgeon, if any:  ?11/08/2021 ?--Dr. Rolm Bookbinder (office visit) ?MRI bilateral breast ?Genetic testing ?Bracketed lumpectomy vs mastectomy ?sn biopsy with magtrace ?With her dense breast, family history, and possible genetic mutation I think it is very reasonable to obtain an MRI of her bilateral breast.  ?I am concerned that this is 1 larger process with 2 abnormal biopsies on this side.  ?This would certainly aid the decision in lumpectomy versus mastectomy for her.  ?We discussed MRI as well as the possible need for additional biopsies and she is agreeable to proceed with this.  ?I think this is an important part of her evaluation presurgical. ?We also discussed genetic testing. She is certainly a candidate  I am not sure what abnormality her sister had.  ?She agreed to proceed with testing today and I did send that off. ?We discussed the staging and pathophysiology of breast cancer.  ?We discussed all of the different options for treatment for breast cancer including surgery, chemotherapy, radiation therapy, Herceptin, and antiestrogen therapy. ?We discussed a sentinel lymph node biopsy as she does not appear to having lymph node involvement right now.  ?We discussed the options for treatment of the breast cancer which included lumpectomy versus a mastectomy.  ?She understands her further therapy will be based on what her stages at the time of her operation. ? ?Past/Anticipated interventions by medical oncology, if any:  ?Under care of Dr. Nicholas Lose ?11/12/2021 ?--Recommendations: ?1. Breast conserving surgery followed by ?2. Adjuvant radiation therapy followed by ?3. Adjuvant antiestrogen therapy ?Patient is getting a breast MRI to determine if she needs more extensive surgery. ?I discussed with her that unless her tumor is greater than 1 cm or higher grade she would not need chemotherapy.   ?Therefore Oncotype is not planned to be sent.   ?We will await to see the final pathology before deciding on Oncotype. ?Return to clinic after surgery to discuss final pathology report ? ?Lymphedema issues, if any:  No   ? ?Pain issues, if any:  Some in breast area  ? ?SAFETY ISSUES: ?Prior radiation? No ?Pacemaker/ICD? No ?Possible current pregnancy? No--hysterectomy ?Is the patient on methotrexate? No ? ?Current Complaints / other details:   ?Vitals:  ? 11/20/21 1008  ?BP: 128/78  ?Pulse: 67  ?Resp: 18  ?Temp: (!) 97.2 ?F (36.2 ?C)  ?SpO2: 100%  ?Weight: 86 kg  ? ?   ? ? ? ?

## 2021-11-21 ENCOUNTER — Encounter: Payer: Self-pay | Admitting: Licensed Clinical Social Worker

## 2021-11-21 NOTE — Progress Notes (Signed)
[facility]  Initial Assessment   Christy Flores is a 71 y.o. year old female contacted by phone. Clinical Social Work was referred by  new pt protocol  for assessment of psychosocial needs.   SDOH (Social Determinants of Health) assessments performed: Yes SDOH Interventions    Flowsheet Row Most Recent Value  SDOH Interventions   Food Insecurity Interventions Intervention Not Indicated  Financial Strain Interventions Intervention Not Indicated  Housing Interventions Intervention Not Indicated  Transportation Interventions Intervention Not Indicated       SDOH Screenings   Alcohol Screen: Not on file  Depression (PHQ2-9): Not on file  Financial Resource Strain: Low Risk    Difficulty of Paying Living Expenses: Not hard at all  Food Insecurity: No Food Insecurity   Worried About Charity fundraiser in the Last Year: Never true   Arboriculturist in the Last Year: Never true  Housing: Low Risk    Last Housing Risk Score: 0  Physical Activity: Not on file  Social Connections: Not on file  Stress: Not on file  Tobacco Use: Low Risk    Smoking Tobacco Use: Never   Smokeless Tobacco Use: Never   Passive Exposure: Not on file  Transportation Needs: No Transportation Needs   Lack of Transportation (Medical): No   Lack of Transportation (Non-Medical): No     Distress Screen completed: No    11/20/2021   10:10 AM  ONCBCN DISTRESS SCREENING  Screening Type Initial Screening  Distress experienced in past week (1-10) 0      Family/Social Information:  Housing Arrangement: patient lives with husband Family members/support persons in your life? Family Transportation concerns: no  Employment: Retired .  Income source: retirement income Financial concerns: No Type of concern: None Food access concerns: no Religious or spiritual practice: Not known Services Currently in place:  n/a  Coping/ Adjustment to diagnosis: Patient understands treatment plan and  what happens next? yes, waiting on MRI biopsy to determine type of surgery. Is anxious to have surgery scheduled. Wants to make a plan as she helps watch her grandchildren Concerns about diagnosis and/or treatment:  waiting for surgery Patient reported stressors: Adjusting to my illness Hopes and/or priorities: hoping for lumpectomy, but generally wants to have surgery completed quickly Current coping skills/ strengths: Capable of independent living , Communication skills , Motivation for treatment/growth , and Supportive family/friends     SUMMARY: Current SDOH Barriers:  No significant SDOH barriers at this time  Clinical Social Work Clinical Goal(s):  No clinical social work goals at this time  Interventions: Discussed common feeling and emotions when being diagnosed with cancer, and the importance of support during treatment Informed patient of the support team roles and support services at [facility] Provided [name] contact information and encouraged patient to call with any questions or concerns   Follow Up Plan: Patient will contact CSW with any support or resource needs Patient verbalizes understanding of plan: Yes    Christy Flores E Marquist Binstock, LCSW

## 2021-11-27 ENCOUNTER — Ambulatory Visit
Admission: RE | Admit: 2021-11-27 | Discharge: 2021-11-27 | Disposition: A | Discharge status: Home / Self Care | Payer: Medicare PPO | Source: Ambulatory Visit | Attending: General Surgery | Admitting: General Surgery

## 2021-11-27 DIAGNOSIS — C50911 Malignant neoplasm of unspecified site of right female breast: Secondary | ICD-10-CM | POA: Diagnosis not present

## 2021-11-27 DIAGNOSIS — R9389 Abnormal findings on diagnostic imaging of other specified body structures: Secondary | ICD-10-CM

## 2021-11-27 DIAGNOSIS — R928 Other abnormal and inconclusive findings on diagnostic imaging of breast: Secondary | ICD-10-CM | POA: Diagnosis not present

## 2021-11-27 DIAGNOSIS — C50411 Malignant neoplasm of upper-outer quadrant of right female breast: Secondary | ICD-10-CM

## 2021-11-27 HISTORY — PX: BREAST BIOPSY: SHX20

## 2021-11-27 MED ORDER — GADOBUTROL 1 MMOL/ML IV SOLN
7.0000 mL | Freq: Once | INTRAVENOUS | Status: AC | PRN
  Administered 2021-11-27: 7 mL via INTRAVENOUS

## 2021-11-29 ENCOUNTER — Encounter: Payer: Self-pay | Admitting: *Deleted

## 2021-12-02 DIAGNOSIS — C50111 Malignant neoplasm of central portion of right female breast: Secondary | ICD-10-CM | POA: Diagnosis not present

## 2021-12-05 ENCOUNTER — Other Ambulatory Visit: Payer: Self-pay | Admitting: General Surgery

## 2021-12-05 DIAGNOSIS — C50411 Malignant neoplasm of upper-outer quadrant of right female breast: Secondary | ICD-10-CM

## 2021-12-06 ENCOUNTER — Other Ambulatory Visit: Payer: Self-pay | Admitting: General Surgery

## 2021-12-06 DIAGNOSIS — Z170 Estrogen receptor positive status [ER+]: Secondary | ICD-10-CM

## 2021-12-11 ENCOUNTER — Encounter (HOSPITAL_COMMUNITY)
Admission: RE | Admit: 2021-12-11 | Discharge: 2021-12-11 | Disposition: A | Discharge status: Home / Self Care | Payer: Medicare PPO | Source: Ambulatory Visit | Attending: General Surgery | Admitting: General Surgery

## 2021-12-11 ENCOUNTER — Encounter (HOSPITAL_COMMUNITY): Payer: Self-pay

## 2021-12-11 VITALS — BP 122/66 | HR 70 | Temp 97.6°F | Resp 17 | Ht 64.0 in | Wt 191.0 lb

## 2021-12-11 DIAGNOSIS — Z01812 Encounter for preprocedural laboratory examination: Secondary | ICD-10-CM | POA: Insufficient documentation

## 2021-12-11 DIAGNOSIS — Z9889 Other specified postprocedural states: Secondary | ICD-10-CM | POA: Insufficient documentation

## 2021-12-11 DIAGNOSIS — C50911 Malignant neoplasm of unspecified site of right female breast: Secondary | ICD-10-CM | POA: Diagnosis not present

## 2021-12-11 DIAGNOSIS — K219 Gastro-esophageal reflux disease without esophagitis: Secondary | ICD-10-CM | POA: Insufficient documentation

## 2021-12-11 DIAGNOSIS — Z9049 Acquired absence of other specified parts of digestive tract: Secondary | ICD-10-CM | POA: Insufficient documentation

## 2021-12-11 DIAGNOSIS — Z01818 Encounter for other preprocedural examination: Secondary | ICD-10-CM

## 2021-12-11 DIAGNOSIS — Z90710 Acquired absence of both cervix and uterus: Secondary | ICD-10-CM | POA: Diagnosis not present

## 2021-12-11 DIAGNOSIS — G4733 Obstructive sleep apnea (adult) (pediatric): Secondary | ICD-10-CM | POA: Diagnosis not present

## 2021-12-11 LAB — CBC
HCT: 41.3 % (ref 36.0–46.0)
Hemoglobin: 13.5 g/dL (ref 12.0–15.0)
MCH: 32.5 pg (ref 26.0–34.0)
MCHC: 32.7 g/dL (ref 30.0–36.0)
MCV: 99.3 fL (ref 80.0–100.0)
Platelets: 214 10*3/uL (ref 150–400)
RBC: 4.16 MIL/uL (ref 3.87–5.11)
RDW: 12.5 % (ref 11.5–15.5)
WBC: 5.1 10*3/uL (ref 4.0–10.5)
nRBC: 0 % (ref 0.0–0.2)

## 2021-12-11 NOTE — Progress Notes (Signed)
Surgical Instructions    Your procedure is scheduled on Tuesday June 13.  Report to [facility] Main Entrance "A" at 1:00 P.M., then check in with the Admitting office.  Call this number if you have problems the morning of surgery:  [phone]   If you have any questions prior to your surgery date call [phone]: Open Monday-Friday 8am-4pm    Remember:  Do not eat after midnight the night before your surgery  You may drink clear liquids until 12 pm the morning of your surgery.   Clear liquids allowed are: Water, Non-Citrus Juices (without pulp), Carbonated Beverages, Clear Tea, Black Coffee ONLY (NO MILK, CREAM OR POWDERED CREAMER of any kind), and Gatorade  The day of surgery (if you do NOT have diabetes):  Drink ONE (1) Pre-Surgery Clear Ensure by 12:00 pm the morning of surgery. Drink in one sitting. Do not sip.  This drink was given to you during your hospital  pre-op appointment visit.  Nothing else to drink after completing the  Pre-Surgery Clear Ensure.         If you have questions, please contact your surgeon's office.      Take these medicines the morning of surgery with A SIP OF WATER:  acetaminophen (TYLENOL) if needed citalopram (CELEXA)  cycloSPORINE (RESTASIS) if needed  As of today, STOP taking any Aspirin (unless otherwise instructed by your surgeon) Aleve, Naproxen, Ibuprofen, Motrin, Advil, Goody's, BC's, all herbal medications, fish oil, and all vitamins.           Do not wear jewelry or makeup Do not wear lotions, powders, perfumes/colognes, or deodorant. Do not shave 48 hours prior to surgery.  Men may shave face and neck. Do not bring valuables to the hospital. Do not wear nail polish, gel polish, artificial nails, or any other type of covering on natural nails (fingers and toes) If you have artificial nails or gel coating that need to be removed by a nail salon, please have this removed prior to surgery. Artificial nails or gel coating may  interfere with anesthesia's ability to adequately monitor your vital signs.  [facility] is not responsible for any belongings or valuables. .   Do NOT Smoke (Tobacco/Vaping)  24 hours prior to your procedure  If you use a CPAP at night, you may bring your mask for your overnight stay.   Contacts, glasses, hearing aids, dentures or partials may not be worn into surgery, please bring cases for these belongings   For patients admitted to the hospital, discharge time will be determined by your treatment team.   Patients discharged the day of surgery will not be allowed to drive home, and someone needs to stay with them for 24 hours.   SURGICAL WAITING ROOM VISITATION Patients having surgery or a procedure in a hospital may have two support people. Children under the age of 31 must have an adult with them who is not the patient. They may stay in the waiting area during the procedure and may switch out with other visitors. If the patient needs to stay at the hospital during part of their recovery, the visitor guidelines for inpatient rooms apply.  Please refer to the [facility] website for the visitor guidelines for Inpatients (after your surgery is over and you are in a regular room).       Special instructions:    Oral Hygiene is also important to reduce your risk of infection.  Remember - BRUSH YOUR TEETH THE MORNING OF SURGERY WITH YOUR  REGULAR TOOTHPASTE   [facility]- Preparing For Surgery  Before surgery, you can play an important role. Because skin is not sterile, your skin needs to be as free of germs as possible. You can reduce the number of germs on your skin by washing with CHG (chlorahexidine gluconate) Soap before surgery.  CHG is an antiseptic cleaner which kills germs and bonds with the skin to continue killing germs even after washing.     Please do not use if you have an allergy to CHG or antibacterial soaps. If your skin becomes reddened/irritated stop using the CHG.   Do not shave (including legs and underarms) for at least 48 hours prior to first CHG shower. It is OK to shave your face.  Please follow these instructions carefully.     Shower the NIGHT BEFORE SURGERY and the MORNING OF SURGERY with CHG Soap.   If you chose to wash your hair, wash your hair first as usual with your normal shampoo. After you shampoo, rinse your hair and body thoroughly to remove the shampoo.  Then [employer] and genitals (private parts) with your normal soap and rinse thoroughly to remove soap.  After that Use CHG Soap as you would any other liquid soap. You can apply CHG directly to the skin and wash gently with a scrungie or a clean washcloth.   Apply the CHG Soap to your body ONLY FROM THE NECK DOWN.  Do not use on open wounds or open sores. Avoid contact with your eyes, ears, mouth and genitals (private parts). Wash Face and genitals (private parts)  with your normal soap.   Wash thoroughly, paying special attention to the area where your surgery will be performed.  Thoroughly rinse your body with warm water from the neck down.  DO NOT shower/wash with your normal soap after using and rinsing off the CHG Soap.  Pat yourself dry with a CLEAN TOWEL.  Wear CLEAN PAJAMAS to bed the night before surgery  Place CLEAN SHEETS on your bed the night before your surgery  DO NOT SLEEP WITH PETS.   Day of Surgery:  Take a shower with CHG soap. Wear Clean/Comfortable clothing the morning of surgery Do not apply any deodorants/lotions.   Remember to brush your teeth WITH YOUR REGULAR TOOTHPASTE.    If you received a COVID test during your pre-op visit, it is requested that you wear a mask when out in public, stay away from anyone that may not be feeling well, and notify your surgeon if you develop symptoms. If you have been in contact with anyone that has tested positive in the last 10 days, please notify your surgeon.    Please read over the following fact sheets that  you were given.

## 2021-12-11 NOTE — Progress Notes (Signed)
PCP - Dr. Asencion Noble Cardiologist - denies  PPM/ICD - denies Device Orders -  Rep Notified -   Chest x-ray - none EKG - none Stress Test - none ECHO - none Cardiac Cath - none  Sleep Study - yes CPAP - yes  Fasting Blood Sugar - na Checks Blood Sugar _____ times a day  Blood Thinner Instructions:na Aspirin Instructions:na  ERAS Protcol -clear liquids until 12pm PRE-SURGERY Ensure or G2- Ensure  COVID TEST- na   Anesthesia review: yes-seed patient  Patient denies shortness of breath, fever, cough and chest pain at PAT appointment   All instructions explained to the patient, with a verbal understanding of the material. Patient agrees to go over the instructions while at home for a better understanding. Patient also instructed to wear a mask when out in public prior to surgery. The opportunity to ask questions was provided.

## 2021-12-12 NOTE — Anesthesia Preprocedure Evaluation (Signed)
Anesthesia Evaluation Anesthesia Physical Anesthesia Plan  ASA:   Anesthesia Plan:    Post-op Pain Management:    Induction:   PONV Risk Score and Plan:   Airway Management Planned:   Additional Equipment:   Intra-op Plan:   Post-operative Plan:   Informed Consent:   Plan Discussed with:   Anesthesia Plan Comments: (PAT note written 12/12/2021 by Myra Gianotti, PA-C. )        Anesthesia Quick Evaluation

## 2021-12-12 NOTE — Progress Notes (Signed)
Anesthesia Chart Review:  Case: [id number] Date/Time: 12/17/21 1445   Procedures:      RIGHT BREAST LUMPECTOMY WITH RADIOACTIVE SEED LOCALIZATION (Right: Breast) - GEN AND PEC BLOCK     RIGHT BREAST SEED BRACKETED EXCISIONAL BIOPSY (Right)     RIGHT AXILLARY SENTINEL NODE BIOPSY (Right)   Anesthesia type: General   Pre-op diagnosis: RIGHT BREAST CANCER   Location: [facility] OR ROOM 05 / [facility]   Surgeons: Rolm Bookbinder, MD       DISCUSSION: Patient is a 71 year old female scheduled for the above procedure.  History includes never smoker, right breast cancer (right breat biopsy 10/23/21: invasive ductal carcinoma), GERD, OSA (uses CPAP), rhinoplasty, hysterectomy (w/ BSO 05/23/08), cholecystectomy (04/04/09), spinal surgery.   CBC normal. She denied chest pain and SOB at PAT RN interview.   RSL is scheduled for 12/16/21 at 1:30 PM. Anesthesia team to evaluate on the day of surgery.    VS: BP 122/66   Pulse 70   Temp 36.4 C (Oral)   Resp 17   Ht '5\' 4"'$  (1.626 m)   Wt 86.6 kg   SpO2 97%   BMI 32.79 kg/m    PROVIDERS: Asencion Noble, MD is PCP  Nicholas Lose, MD is HEM-ONC Eppie Gibson, MD is RAD-ONC   LABS: Labs reviewed: Acceptable for surgery. (all labs ordered are listed, but only abnormal results are displayed)  Labs Reviewed  CBC    EKG: N/A   CV: N/A  Past Medical History:  Diagnosis Date   GERD (gastroesophageal reflux disease)    Senile osteoporosis    Sleep apnea    on CPAP    Past Surgical History:  Procedure Laterality Date   ABDOMINAL HYSTERECTOMY     BACK SURGERY     CHOLECYSTECTOMY     EYE SURGERY  2000   lasik   RHINOPLASTY     TUBAL LIGATION      MEDICATIONS:  acetaminophen (TYLENOL) 500 MG tablet   BIOTIN PO   CALCIUM PO   citalopram (CELEXA) 20 MG tablet   Cyanocobalamin (B-12 PO)   cycloSPORINE (RESTASIS) 0.05 % ophthalmic emulsion   denosumab (PROLIA) 60 MG/ML SOSY injection   Multiple Vitamins-Minerals (MULTIVITAMIN WITH MINERALS)  tablet   naproxen sodium (ALEVE) 220 MG tablet   Polyvinyl Alcohol-Povidone (REFRESH OP)   TURMERIC PO   VITAMIN D PO   No current facility-administered medications for this encounter.    Myra Gianotti, PA-C Surgical Short Stay/Anesthesiology [facility] Phone [phone] [facility] Phone [phone] 12/12/2021 1:42 PM

## 2021-12-13 ENCOUNTER — Encounter: Payer: Self-pay | Admitting: *Deleted

## 2021-12-16 ENCOUNTER — Ambulatory Visit
Admission: RE | Admit: 2021-12-16 | Discharge: 2021-12-16 | Disposition: A | Discharge status: Home / Self Care | Payer: Medicare PPO | Source: Ambulatory Visit | Attending: General Surgery | Admitting: General Surgery

## 2021-12-16 ENCOUNTER — Other Ambulatory Visit: Payer: Self-pay | Admitting: General Surgery

## 2021-12-16 DIAGNOSIS — C50411 Malignant neoplasm of upper-outer quadrant of right female breast: Secondary | ICD-10-CM

## 2021-12-16 DIAGNOSIS — C50911 Malignant neoplasm of unspecified site of right female breast: Secondary | ICD-10-CM | POA: Diagnosis not present

## 2021-12-17 ENCOUNTER — Encounter (HOSPITAL_BASED_OUTPATIENT_CLINIC_OR_DEPARTMENT_OTHER): Payer: Self-pay | Admitting: General Surgery

## 2021-12-17 ENCOUNTER — Ambulatory Visit (HOSPITAL_BASED_OUTPATIENT_CLINIC_OR_DEPARTMENT_OTHER): Payer: Medicare PPO | Admitting: Vascular Surgery

## 2021-12-17 ENCOUNTER — Telehealth: Payer: Self-pay | Admitting: Pharmacy Technician

## 2021-12-17 ENCOUNTER — Ambulatory Visit (HOSPITAL_BASED_OUTPATIENT_CLINIC_OR_DEPARTMENT_OTHER)
Admission: RE | Admit: 2021-12-17 | Discharge: 2021-12-17 | Disposition: A | Discharge status: Home / Self Care | Payer: Medicare PPO | Attending: General Surgery | Admitting: General Surgery

## 2021-12-17 ENCOUNTER — Encounter (HOSPITAL_BASED_OUTPATIENT_CLINIC_OR_DEPARTMENT_OTHER)
Admission: RE | Disposition: A | Discharge status: Home / Self Care | Payer: Self-pay | Source: Home / Self Care | Attending: General Surgery

## 2021-12-17 ENCOUNTER — Ambulatory Visit
Admission: RE | Admit: 2021-12-17 | Discharge: 2021-12-17 | Disposition: A | Discharge status: Home / Self Care | Payer: Medicare PPO | Source: Ambulatory Visit | Attending: General Surgery | Admitting: General Surgery

## 2021-12-17 ENCOUNTER — Other Ambulatory Visit: Payer: Self-pay

## 2021-12-17 DIAGNOSIS — C50911 Malignant neoplasm of unspecified site of right female breast: Secondary | ICD-10-CM | POA: Insufficient documentation

## 2021-12-17 DIAGNOSIS — Z800 Family history of malignant neoplasm of digestive organs: Secondary | ICD-10-CM | POA: Insufficient documentation

## 2021-12-17 DIAGNOSIS — G8918 Other acute postprocedural pain: Secondary | ICD-10-CM | POA: Diagnosis not present

## 2021-12-17 DIAGNOSIS — Z803 Family history of malignant neoplasm of breast: Secondary | ICD-10-CM | POA: Insufficient documentation

## 2021-12-17 DIAGNOSIS — Z808 Family history of malignant neoplasm of other organs or systems: Secondary | ICD-10-CM | POA: Diagnosis not present

## 2021-12-17 DIAGNOSIS — K219 Gastro-esophageal reflux disease without esophagitis: Secondary | ICD-10-CM | POA: Insufficient documentation

## 2021-12-17 DIAGNOSIS — C50919 Malignant neoplasm of unspecified site of unspecified female breast: Secondary | ICD-10-CM | POA: Diagnosis not present

## 2021-12-17 DIAGNOSIS — Z170 Estrogen receptor positive status [ER+]: Secondary | ICD-10-CM | POA: Diagnosis not present

## 2021-12-17 DIAGNOSIS — C50411 Malignant neoplasm of upper-outer quadrant of right female breast: Secondary | ICD-10-CM

## 2021-12-17 DIAGNOSIS — R920 Mammographic microcalcification found on diagnostic imaging of breast: Secondary | ICD-10-CM | POA: Diagnosis not present

## 2021-12-17 DIAGNOSIS — G4730 Sleep apnea, unspecified: Secondary | ICD-10-CM | POA: Insufficient documentation

## 2021-12-17 DIAGNOSIS — R921 Mammographic calcification found on diagnostic imaging of breast: Secondary | ICD-10-CM | POA: Diagnosis not present

## 2021-12-17 DIAGNOSIS — C50111 Malignant neoplasm of central portion of right female breast: Secondary | ICD-10-CM | POA: Diagnosis not present

## 2021-12-17 DIAGNOSIS — R928 Other abnormal and inconclusive findings on diagnostic imaging of breast: Secondary | ICD-10-CM | POA: Diagnosis not present

## 2021-12-17 HISTORY — PX: RADIOACTIVE SEED GUIDED EXCISIONAL BREAST BIOPSY: SHX6490

## 2021-12-17 HISTORY — PX: AXILLARY LYMPH NODE BIOPSY: SHX5737

## 2021-12-17 HISTORY — PX: BREAST LUMPECTOMY WITH RADIOACTIVE SEED LOCALIZATION: SHX6424

## 2021-12-17 SURGERY — BREAST LUMPECTOMY WITH RADIOACTIVE SEED LOCALIZATION
Anesthesia: General | Site: Breast | Laterality: Right

## 2021-12-17 MED ORDER — GLYCOPYRROLATE 0.2 MG/ML IJ SOLN
INTRAMUSCULAR | Status: DC | PRN
  Administered 2021-12-17: .1 mg via INTRAVENOUS

## 2021-12-17 MED ORDER — FENTANYL CITRATE (PF) 100 MCG/2ML IJ SOLN
25.0000 ug | INTRAMUSCULAR | Status: DC | PRN
  Administered 2021-12-17: 50 ug via INTRAVENOUS

## 2021-12-17 MED ORDER — ONDANSETRON HCL 4 MG/2ML IJ SOLN
INTRAMUSCULAR | Status: DC | PRN
  Administered 2021-12-17: 4 mg via INTRAVENOUS

## 2021-12-17 MED ORDER — FENTANYL CITRATE (PF) 100 MCG/2ML IJ SOLN
INTRAMUSCULAR | Status: AC
  Filled 2021-12-17: qty 2

## 2021-12-17 MED ORDER — CEFAZOLIN SODIUM-DEXTROSE 2-4 GM/100ML-% IV SOLN: 2.0000 g | INTRAVENOUS | Status: AC

## 2021-12-17 MED ORDER — ENSURE PRE-SURGERY PO LIQD: 296.0000 mL | Freq: Once | ORAL | Status: DC

## 2021-12-17 MED ORDER — ROPIVACAINE HCL 5 MG/ML IJ SOLN
INTRAMUSCULAR | Status: DC | PRN
  Administered 2021-12-17: 30 mL via PERINEURAL

## 2021-12-17 MED ORDER — CEFAZOLIN SODIUM-DEXTROSE 2-4 GM/100ML-% IV SOLN
2.0000 g | INTRAVENOUS | Status: AC
  Administered 2021-12-17: 2 g via INTRAVENOUS

## 2021-12-17 MED ORDER — 0.9 % SODIUM CHLORIDE (POUR BTL) OPTIME
TOPICAL | Status: DC | PRN
  Administered 2021-12-17: 60 mL

## 2021-12-17 MED ORDER — LACTATED RINGERS IV SOLN: INTRAVENOUS | Status: DC

## 2021-12-17 MED ORDER — MIDAZOLAM HCL 2 MG/2ML IJ SOLN
INTRAMUSCULAR | Status: AC
  Filled 2021-12-17: qty 2

## 2021-12-17 MED ORDER — MAGTRACE LYMPHATIC TRACER
INTRAMUSCULAR | Status: DC | PRN
  Administered 2021-12-17: 2 mL via INTRAMUSCULAR

## 2021-12-17 MED ORDER — GLYCOPYRROLATE PF 0.2 MG/ML IJ SOSY
PREFILLED_SYRINGE | INTRAMUSCULAR | Status: AC
  Filled 2021-12-17: qty 1

## 2021-12-17 MED ORDER — DEXAMETHASONE SODIUM PHOSPHATE 10 MG/ML IJ SOLN
INTRAMUSCULAR | Status: DC | PRN
  Administered 2021-12-17: 5 mg

## 2021-12-17 MED ORDER — CHLORHEXIDINE GLUCONATE CLOTH 2 % EX PADS: 6.0000 | MEDICATED_PAD | Freq: Once | CUTANEOUS | Status: DC

## 2021-12-17 MED ORDER — FENTANYL CITRATE (PF) 100 MCG/2ML IJ SOLN
INTRAMUSCULAR | Status: DC | PRN
  Administered 2021-12-17 (×4): 25 ug via INTRAVENOUS

## 2021-12-17 MED ORDER — DEXAMETHASONE SODIUM PHOSPHATE 10 MG/ML IJ SOLN
INTRAMUSCULAR | Status: DC | PRN
  Administered 2021-12-17: 8 mg via INTRAVENOUS

## 2021-12-17 MED ORDER — PHENYLEPHRINE HCL (PRESSORS) 10 MG/ML IV SOLN
INTRAVENOUS | Status: DC | PRN
  Administered 2021-12-17: 80 ug via INTRAVENOUS

## 2021-12-17 MED ORDER — CEFAZOLIN SODIUM-DEXTROSE 2-4 GM/100ML-% IV SOLN
INTRAVENOUS | Status: AC
  Filled 2021-12-17: qty 100

## 2021-12-17 MED ORDER — EPHEDRINE SULFATE (PRESSORS) 50 MG/ML IJ SOLN
INTRAMUSCULAR | Status: DC | PRN
  Administered 2021-12-17: 5 mg via INTRAVENOUS

## 2021-12-17 MED ORDER — LIDOCAINE HCL (CARDIAC) PF 100 MG/5ML IV SOSY
PREFILLED_SYRINGE | INTRAVENOUS | Status: DC | PRN
  Administered 2021-12-17: 20 mg via INTRAVENOUS

## 2021-12-17 MED ORDER — BUPIVACAINE HCL (PF) 0.25 % IJ SOLN
INTRAMUSCULAR | Status: DC | PRN
  Administered 2021-12-17: 4 mL

## 2021-12-17 MED ORDER — ACETAMINOPHEN 500 MG PO TABS
ORAL_TABLET | ORAL | Status: AC
  Filled 2021-12-17: qty 2

## 2021-12-17 MED ORDER — ONDANSETRON HCL 4 MG/2ML IJ SOLN
INTRAMUSCULAR | Status: AC
  Filled 2021-12-17: qty 4

## 2021-12-17 MED ORDER — PROPOFOL 10 MG/ML IV BOLUS
INTRAVENOUS | Status: DC | PRN
  Administered 2021-12-17: 50 mg via INTRAVENOUS
  Administered 2021-12-17: 150 mg via INTRAVENOUS

## 2021-12-17 MED ORDER — MIDAZOLAM HCL 2 MG/2ML IJ SOLN
2.0000 mg | Freq: Once | INTRAMUSCULAR | Status: AC
  Administered 2021-12-17: 2 mg via INTRAVENOUS

## 2021-12-17 MED ORDER — ACETAMINOPHEN 500 MG PO TABS
1000.0000 mg | ORAL_TABLET | ORAL | Status: AC
  Administered 2021-12-17: 1000 mg via ORAL

## 2021-12-17 MED ORDER — FENTANYL CITRATE (PF) 100 MCG/2ML IJ SOLN
50.0000 ug | Freq: Once | INTRAMUSCULAR | Status: AC
  Administered 2021-12-17: 50 ug via INTRAVENOUS

## 2021-12-17 MED ORDER — EPHEDRINE 5 MG/ML INJ
INTRAVENOUS | Status: AC
  Filled 2021-12-17: qty 5

## 2021-12-17 MED ORDER — PROPOFOL 500 MG/50ML IV EMUL
INTRAVENOUS | Status: AC
  Filled 2021-12-17: qty 50

## 2021-12-17 MED ORDER — TRAMADOL HCL 50 MG PO TABS: 100.0000 mg | ORAL_TABLET | Freq: Four times a day (QID) | ORAL | 0 refills | Status: DC | PRN

## 2021-12-17 SURGICAL SUPPLY — 66 items
ADH SKN CLS APL DERMABOND .7 (GAUZE/BANDAGES/DRESSINGS) ×4
APL PRP STRL LF DISP 70% ISPRP (MISCELLANEOUS) ×2
APL SKNCLS STERI-STRIP NONHPOA (GAUZE/BANDAGES/DRESSINGS) ×2
APPLIER CLIP 9.375 MED OPEN (MISCELLANEOUS) ×3
APR CLP MED 9.3 20 MLT OPN (MISCELLANEOUS) ×2
BENZOIN TINCTURE PRP APPL 2/3 (GAUZE/BANDAGES/DRESSINGS) ×3 IMPLANT
BINDER BREAST LRG (GAUZE/BANDAGES/DRESSINGS) IMPLANT
BINDER BREAST MEDIUM (GAUZE/BANDAGES/DRESSINGS) IMPLANT
BINDER BREAST XLRG (GAUZE/BANDAGES/DRESSINGS) IMPLANT
BINDER BREAST XXLRG (GAUZE/BANDAGES/DRESSINGS) IMPLANT
BLADE SURG 15 STRL LF DISP TIS (BLADE) ×2 IMPLANT
BLADE SURG 15 STRL SS (BLADE) ×3
BNDG COHESIVE 4X5 TAN ST LF (GAUZE/BANDAGES/DRESSINGS) IMPLANT
CANISTER SUC SOCK COL 7IN (MISCELLANEOUS) IMPLANT
CANISTER SUCT 1200ML W/VALVE (MISCELLANEOUS) IMPLANT
CHLORAPREP W/TINT 26 (MISCELLANEOUS) ×3 IMPLANT
CLIP APPLIE 9.375 MED OPEN (MISCELLANEOUS) IMPLANT
CLIP TI WIDE RED SMALL 6 (CLIP) IMPLANT
COVER BACK TABLE 60X90IN (DRAPES) ×3 IMPLANT
COVER MAYO STAND STRL (DRAPES) ×3 IMPLANT
COVER PROBE W GEL 5X96 (DRAPES) ×4 IMPLANT
DERMABOND ADVANCED (GAUZE/BANDAGES/DRESSINGS) ×2
DERMABOND ADVANCED .7 DNX12 (GAUZE/BANDAGES/DRESSINGS) ×2 IMPLANT
DRAPE LAPAROSCOPIC ABDOMINAL (DRAPES) ×3 IMPLANT
DRAPE U-SHAPE 76X120 STRL (DRAPES) ×2 IMPLANT
DRAPE UTILITY XL STRL (DRAPES) ×3 IMPLANT
DRSG TEGADERM 4X4.75 (GAUZE/BANDAGES/DRESSINGS) ×3 IMPLANT
ELECT COATED BLADE 2.86 ST (ELECTRODE) ×4 IMPLANT
ELECT REM PT RETURN 9FT ADLT (ELECTROSURGICAL) ×3
ELECTRODE REM PT RTRN 9FT ADLT (ELECTROSURGICAL) ×2 IMPLANT
GAUZE SPONGE 4X4 12PLY STRL LF (GAUZE/BANDAGES/DRESSINGS) ×3 IMPLANT
GLOVE BIO SURGEON STRL SZ7 (GLOVE) ×6 IMPLANT
GLOVE BIOGEL PI IND STRL 7.0 (GLOVE) IMPLANT
GLOVE BIOGEL PI IND STRL 7.5 (GLOVE) ×2 IMPLANT
GLOVE BIOGEL PI INDICATOR 7.0 (GLOVE) ×3
GLOVE BIOGEL PI INDICATOR 7.5 (GLOVE) ×1
GLOVE SURG SS PI 7.0 STRL IVOR (GLOVE) ×1 IMPLANT
GOWN STRL REUS W/ TWL LRG LVL3 (GOWN DISPOSABLE) ×4 IMPLANT
GOWN STRL REUS W/TWL LRG LVL3 (GOWN DISPOSABLE) ×9
HEMOSTAT ARISTA ABSORB 3G PWDR (HEMOSTASIS) ×1 IMPLANT
KIT MARKER MARGIN INK (KITS) ×3 IMPLANT
NDL HYPO 25X1 1.5 SAFETY (NEEDLE) ×2 IMPLANT
NEEDLE HYPO 25X1 1.5 SAFETY (NEEDLE) ×3 IMPLANT
NS IRRIG 1000ML POUR BTL (IV SOLUTION) ×1 IMPLANT
PACK BASIN DAY SURGERY FS (CUSTOM PROCEDURE TRAY) ×3 IMPLANT
PENCIL SMOKE EVACUATOR (MISCELLANEOUS) ×4 IMPLANT
RETRACTOR ONETRAX LX 90X20 (MISCELLANEOUS) ×1 IMPLANT
SLEEVE SCD COMPRESS KNEE MED (STOCKING) ×3 IMPLANT
SPIKE FLUID TRANSFER (MISCELLANEOUS) IMPLANT
SPONGE T-LAP 4X18 ~~LOC~~+RFID (SPONGE) ×4 IMPLANT
STRIP CLOSURE SKIN 1/2X4 (GAUZE/BANDAGES/DRESSINGS) ×3 IMPLANT
SUT MNCRL AB 4-0 PS2 18 (SUTURE) ×3 IMPLANT
SUT MON AB 5-0 PS2 18 (SUTURE) ×1 IMPLANT
SUT SILK 2 0 SH (SUTURE) ×1 IMPLANT
SUT VIC AB 2-0 SH 27 (SUTURE) ×21
SUT VIC AB 2-0 SH 27XBRD (SUTURE) ×2 IMPLANT
SUT VIC AB 3-0 SH 27 (SUTURE) ×3
SUT VIC AB 3-0 SH 27X BRD (SUTURE) ×2 IMPLANT
SUT VIC AB 5-0 PS2 18 (SUTURE) IMPLANT
SUT VICRYL AB 3 0 TIES (SUTURE) IMPLANT
SYR CONTROL 10ML LL (SYRINGE) ×3 IMPLANT
TOWEL GREEN STERILE FF (TOWEL DISPOSABLE) ×3 IMPLANT
TRACER MAGTRACE VIAL (MISCELLANEOUS) ×1 IMPLANT
TRAY FAXITRON CT DISP (TRAY / TRAY PROCEDURE) ×4 IMPLANT
TUBE CONNECTING 20X1/4 (TUBING) ×1 IMPLANT
YANKAUER SUCT BULB TIP NO VENT (SUCTIONS) ×1 IMPLANT

## 2021-12-17 NOTE — Anesthesia Procedure Notes (Signed)
Procedure Name: LMA Insertion Date/Time: 12/17/2021 2:51 PM  Performed by: Lavonia Dana, CRNAPre-anesthesia Checklist: Patient identified, Emergency Drugs available, Suction available and Patient being monitored Patient Re-evaluated:Patient Re-evaluated prior to induction Oxygen Delivery Method: Circle system utilized Preoxygenation: Pre-oxygenation with 100% oxygen Induction Type: IV induction Ventilation: Mask ventilation without difficulty LMA: LMA inserted LMA Size: 4.0 Number of attempts: 1 Airway Equipment and Method: Bite block Placement Confirmation: positive ETCO2 Tube secured with: Tape Dental Injury: Teeth and Oropharynx as per pre-operative assessment

## 2021-12-17 NOTE — Transfer of Care (Signed)
Immediate Anesthesia Transfer of Care Note  Patient: Christy Flores  Procedure(s) Performed: RIGHT BREAST LUMPECTOMY WITH RADIOACTIVE SEED LOCALIZATION (Right: Breast) RIGHT BREAST SEED BRACKETED EXCISIONAL BIOPSY (Right: Breast) RIGHT AXILLARY SENTINEL LYMPH NODE BIOPSY (Right: Axilla)  Patient Location: PACU  Anesthesia Type:GA combined with regional for post-op pain  Level of Consciousness: awake  Airway & Oxygen Therapy: Patient Spontanous Breathing and Patient connected to face mask oxygen  Post-op Assessment: Report given to RN and Post -op Vital signs reviewed and stable  Post vital signs: Reviewed and stable  Last Vitals:  Vitals Value Taken Time  BP    Temp    Pulse    Resp    SpO2      Last Pain:  Vitals:   12/17/21 1239  TempSrc: Oral  PainSc: 0-No pain      Patients Stated Pain Goal: 4 (51/02/58 5277)  Complications: No notable events documented.

## 2021-12-17 NOTE — Anesthesia Procedure Notes (Signed)
Anesthesia Regional Block: Pectoralis block   Pre-Anesthetic Checklist: , timeout performed,  Correct Patient, Correct Site, Correct Laterality,  Correct Procedure, Correct Position, site marked,  Risks and benefits discussed,  Pre-op evaluation,  At surgeon's request and post-op pain management  Laterality: Right  Prep: Maximum Sterile Barrier Precautions used, chloraprep       Needles:  Injection technique: Single-shot  Needle Type: Echogenic Stimulator Needle     Needle Length: 9cm  Needle Gauge: 21     Additional Needles:   Procedures:, nerve stimulator,,, ultrasound used (permanent image in chart),,     Nerve Stimulator or Paresthesia:  Response: Peroneal Response: Tibial  Additional Responses:   Narrative:  Start time: 12/17/2021 1:39 PM End time: 12/17/2021 1:42 PM Injection made incrementally with aspirations every 5 mL. Anesthesiologist: Freddrick March, MD

## 2021-12-17 NOTE — Interval H&P Note (Signed)
History and Physical Interval Note:  12/17/2021 2:02 PM  Christy Flores  has presented today for surgery, with the diagnosis of RIGHT BREAST CANCER.  The various methods of treatment have been discussed with the patient and family. After consideration of risks, benefits and other options for treatment, the patient has consented to  Procedure(s) with comments: RIGHT BREAST LUMPECTOMY WITH RADIOACTIVE SEED LOCALIZATION (Right) - GEN AND PEC BLOCK RIGHT BREAST SEED BRACKETED EXCISIONAL BIOPSY (Right) RIGHT AXILLARY SENTINEL NODE BIOPSY (Right) as a surgical intervention.  The patient's history has been reviewed, patient examined, no change in status, stable for surgery.  I have reviewed the patient's chart and labs.  Questions were answered to the patient's satisfaction.     Rolm Bookbinder

## 2021-12-17 NOTE — Op Note (Signed)
Preoperative diagnosis: Clinical stage I right breast cancer Postoperative diagnosis: Same as above Procedure: 1.  Right breast radioactive seed guided lumpectomy 2.  Right deep axillary sentinel lymph node biopsy 3.  Injection of mag trace for sentinel lymph node identification 4. Right breast seed bracketed excisional biopsy Surgeon: Dr. Serita Grammes Anesthesia: General with a pectoral block Estimated blood loss: Minimal Complications: None Drains: None Specimens: 1.  Right breast lumpectomy containing seed and clip marked with paint 2. Additional right breast lumpectomy margin marked short superior, long lateral double deep 3. Right deep axillary sentinel lymph nodes 4. Right breast seed bracketed excisional biopsy Sponge needle count was correct at completion Decision to recovery stable condition  Indications: 71 y.o. female who  underwent mammography. She had C density breast tissue. She had a possible right breast mass and some calcifications noted on her screening mammogram. She was then recalled where she was found to have a persistent area of distortion with some associated calcifications in the upper outer right breast. There was a 3 mm group of calcifications in the upper central right breast. In addition there was a 7 mm group of linear calcifications in the central inner right breast. Targeted ultrasound was done. There is an irregular shadowing mass in the right breast at 12:00 measuring 7 x 7 x 8 mm. This is thought to correspond to the distortion seen in mammography. There was no right axillary adenopathy. She then has undergone ultrasound-guided core biopsy of the retroareolar mass. She has undergone stereo biopsies of the 3 and 7 mm calcifications. Biopsy of the mass shows this to be a grade 1 invasive ductal carcinoma. This is ER and PR positive. This is HER2 negative with a low Ki-67. The stereo biopsies show the one area to be atypia and the other is fibrocystic change and  is benign and concordant. 1 of these is recommended for reexcision the other is recommended not for excision.  Procedure: She first had seeds placed by radiology.  I had these mammograms available for my review.  After informed consent was obtained she then underwent a pectoral block. I also prepped the area lateral to the areola.  I injected 2 cc of mag trace in the subareolar position without complication.  She was given antibiotics.  SCDs were placed.  She was then placed under general anesthesia without complication.  She was prepped and draped in the standard sterile surgical fashion.  A surgical timeout was then performed.  I identified the seed in the upper outer quadrant. I made a periareolar incision to hide the scar later.  I then dissected to the seed. I removed the seed and the surrounding tissue with an attempt to get a clear margin. Mammogram confirmed removal of the seed and the clip.  I removed additional margin as above. I then dissected to the central/UIQ bracketed calcs that were atypia.  I removed this in total. The latera margin of this is the medial margin of the lumpectomy cavity.  Hemostasis was observed. I closed this with 2-0 vicryl for the breast tissue. I did place clips in the cavity. I closed the skin with 3-0 vicryl and 5-0 monocryl. Glue and sterstrips were applied.    I made a curvilinear incision below the axillary hairline. I then entered the axilla.  I used the probe with very low activity. there was no other activity once I was completed this.  These were then passed off the table.  Hemostasis was observed. I closed the axillary  fascia with 2-0 vicryl.  I then closed skin with 3-0 vicryl and 4-0 monocryl. Glue was placed.   she tolerated well, was extubated and transferred to recovery stable

## 2021-12-17 NOTE — Discharge Instructions (Addendum)
[place] Office Phone Number [phone]  BREAST BIOPSY/ PARTIAL MASTECTOMY: POST OP INSTRUCTIONS   Take 400 mg of ibuprofen every 8 hours or 650 mg tylenol every 6 hours for next 72 hours then as needed. Use ice several times daily also. Take your usually prescribed medications unless otherwise directed If you need a refill on your pain medication, please contact your pharmacy.  They will contact our office to request authorization.  Prescriptions will not be filled after 5pm or on week-ends. You should eat very light the first 24 hours after surgery, such as soup, crackers, pudding, etc.  Resume your normal diet the day after surgery. Most patients will experience some swelling and bruising in the breast.  Ice packs and a good support bra will help.  Wear the breast binder provided or a sports bra for 72 hours day and night.  After that wear a sports bra during the day until you return to the office. Swelling and bruising can take several days to resolve.  It is common to experience some constipation if taking pain medication after surgery.  Increasing fluid intake and taking a stool softener will usually help or prevent this problem from occurring.  A mild laxative (Milk of Magnesia or Miralax) should be taken according to package directions if there are no bowel movements after 48 hours. Iused skin glue on the incision, you may shower in 24 hours.  The glue will flake off over the next 2-3 weeks.  Any sutures or staples will be removed at the office during your follow-up visit. ACTIVITIES:  You may resume regular daily activities (gradually increasing) beginning the next day.  Wearing a good support bra or sports bra minimizes pain and swelling.  You may have sexual intercourse when it is comfortable. You may drive when you no longer are taking prescription pain medication, you can comfortably wear a seatbelt, and you can safely maneuver your car and apply brakes. RETURN TO  WORK:  ______________________________________________________________________________________ Dennis Bast should see your doctor in the office for a follow-up appointment approximately two weeks after your surgery.  Your doctor's nurse will typically make your follow-up appointment when she calls you with your pathology report.  Expect your pathology report 3-4 business days after your surgery.  You may call to check if you do not hear from [place] after three days. OTHER INSTRUCTIONS: _______________________________________________________________________________________________ _____________________________________________________________________________________________________________________________________ _____________________________________________________________________________________________________________________________________ _____________________________________________________________________________________________________________________________________  WHEN TO CALL DR WAKEFIELD: Fever over 101.0 Nausea and/or vomiting. Extreme swelling or bruising. Continued bleeding from incision. Increased pain, redness, or drainage from the incision.  The clinic staff is available to answer your questions during regular business hours.  Please don't hesitate to call and ask to speak to one of the nurses for clinical concerns.  If you have a medical emergency, go to the nearest emergency room or call 911.  A surgeon from [facility] is always on call at the hospital.  For further questions, please visit [email or web address] No Tylenol until 6:42 pm   Post Anesthesia Home Care Instructions  Activity: Get plenty of rest for the remainder of the day. A responsible individual must stay with you for 24 hours following the procedure.  For the next 24 hours, DO NOT: -Drive a car -Paediatric nurse -Drink alcoholic beverages -Take any medication unless instructed by your  physician -Make any legal decisions or sign important papers.  Meals: Start with liquid foods such as gelatin or soup. Progress to regular foods as tolerated. Avoid greasy, spicy, heavy foods. If  nausea and/or vomiting occur, drink only clear liquids until the nausea and/or vomiting subsides. Call your physician if vomiting continues.  Special Instructions/Symptoms: Your throat may feel dry or sore from the anesthesia or the breathing tube placed in your throat during surgery. If this causes discomfort, gargle with warm salt water. The discomfort should disappear within 24 hours.  If you had a scopolamine patch placed behind your ear for the management of post- operative nausea and/or vomiting:  1. The medication in the patch is effective for 72 hours, after which it should be removed.  Wrap patch in a tissue and discard in the trash. Wash hands thoroughly with soap and water. 2. You may remove the patch earlier than 72 hours if you experience unpleasant side effects which may include dry mouth, dizziness or visual disturbances. 3. Avoid touching the patch. Wash your hands with soap and water after contact with the patch.

## 2021-12-17 NOTE — Telephone Encounter (Addendum)
Auth Submission: approved Payer: [employer] Medication & CPT/J Code(s) submitted: Prolia (Denosumab) [id number] Route of submission (phone, fax, portal):  Auth was submitted by MD OFFICE - ROY FAGAN. Place of service: Updated to [place] type: Buy/Bill Units/visits requested: X1 DOSE Reference number: [id number] Approval from: 07/07/21 to 07/06/22

## 2021-12-17 NOTE — H&P (Signed)
71 y.o. female who is seen today as an office consultation for evaluation of Breast Cancer (Invasive ductal carcinoma) She has prior right sided excisional biopsy that was benign. She has a family history that is quite significant. She has a sister with breast cancer in her 63s who she thinks may have some unknown genetic mutation. She has a maternal grandmother and 5 maternal aunts with breast cancer. She thinks her mother had ovarian cancer. She has a brother and sister with colon cancer. She has a brother and her father both having melanoma. She had no mass or discharge. She had some breast pain. She underwent mammography. She had C density breast tissue. She had a possible right breast mass and some calcifications noted on her screening mammogram. She was then recalled where she was found to have a persistent area of distortion with some associated calcifications in the upper outer right breast. There was a 3 mm group of calcifications in the upper central right breast. In addition there was a 7 mm group of linear calcifications in the central inner right breast. Targeted ultrasound was done. There is an irregular shadowing mass in the right breast at 12:00 measuring 7 x 7 x 8 mm. This is thought to correspond to the distortion seen in mammography. There was no right axillary adenopathy. She then has undergone ultrasound-guided core biopsy of the retroareolar mass. She has undergone stereo biopsies of the 3 and 7 mm calcifications. Biopsy of the mass shows this to be a grade 1 invasive ductal carcinoma. This is ER and PR positive. This is HER2 negative with a low Ki-67. The stereo biopsies show the one area to be atypia and the other is fibrocystic change and is benign and concordant. 1 of these is recommended for reexcision the other is recommended not for excision.  Review of Systems: A complete review of systems was obtained from the patient. I have reviewed this information and discussed as appropriate  with the patient. See HPI as well for other ROS.  Review of Systems  All other systems reviewed and are negative.   Medical History: Past Medical History:  Diagnosis Date   Sleep apnea   Patient Active Problem List  Diagnosis   Sleep apnea, obstructive   Past Surgical History:  Procedure Laterality Date   back surgery   HYSTERECTOMY   RHINOPLASTY    Allergies  Allergen Reactions   Codeine Unknown   Current Outpatient Medications on File Prior to Visit  Medication Sig Dispense Refill   calcium carbonate 1250 MG capsule Take by mouth   cholecalciferol (VITAMIN D3) 1000 unit tablet Take by mouth   citalopram (CELEXA) 20 MG tablet Take 20 mg by mouth once daily    Family History  Problem Relation Age of Onset   Stroke Mother   High blood pressure (Hypertension) Mother   Diabetes Mother   Skin cancer Father   High blood pressure (Hypertension) Father   Coronary Artery Disease (Blocked arteries around heart) Father   Diabetes Father   Skin cancer Sister   High blood pressure (Hypertension) Sister   Colon cancer Sister   Breast cancer Sister   Stroke Brother   Skin cancer Brother   High blood pressure (Hypertension) Brother   Diabetes Brother   Colon cancer Brother   High blood pressure (Hypertension) Son    Social History   Tobacco Use  Smoking Status Never  Smokeless Tobacco Never  Socioeconomic History   Marital status: Married  Tobacco Use  Smoking status: Never   Smokeless tobacco: Never  Vaping Use   Vaping Use: Never used  Substance and Sexual Activity   Alcohol use: Never   Drug use: Never   Objective:   Vitals:  Body mass index is 31.84 kg/m.  Physical Exam Vitals reviewed.  Constitutional:  Appearance: Normal appearance.  Chest:  Breasts: Right: No inverted nipple, mass or nipple discharge.  Left: No inverted nipple, mass or nipple discharge.  Lymphadenopathy:  Upper Body:  Right upper body: No supraclavicular or axillary  adenopathy.  Left upper body: No supraclavicular or axillary adenopathy.  Neurological:  Mental Status: She is alert.   Assessment and Plan:   Carcinoma of central portion of right breast in female, estrogen receptor positive (CMS-HCC) - Ambulatory Referral to Physical Therapy - Ambulatory Referral to Oncology-Medical - Breast Prosthesis - MRI breast bilateral with and without contrast; Future   Excisional biopsy and lumpectomy,  sn biopsy with magtrace  We discussed the staging and pathophysiology of breast cancer. We discussed all of the different options for treatment for breast cancer including surgery, chemotherapy, radiation therapy, Herceptin, and antiestrogen therapy. We discussed a sentinel lymph node biopsy as she does not appear to having lymph node involvement right now. We discussed the performance of that with injection of radioactive tracer. We discussed that there is a chance of having a positive node with a sentinel lymph node biopsy and we will await the permanent pathology to make any other first further decisions in terms of her treatment. We discussed up to a 5% risk lifetime of chronic shoulder pain as well as lymphedema associated with a sentinel lymph node biopsy. We discussed the options for treatment of the breast cancer which included lumpectomy versus a mastectomy. We discussed the performance of the lumpectomy with radioactive seed placement. We discussed a 5-10% chance of a positive margin requiring reexcision in the operating room. We also discussed that she will likely need radiation therapy if she undergoes lumpectomy. We discussed mastectomy and the postoperative care for that as well. Mastectomy can be followed by reconstruction. The decision for lumpectomy vs mastectomy has no impact on decision for chemotherapy. Most mastectomy patients will not need radiation therapy. We discussed that there is no difference in her survival whether she undergoes lumpectomy with  radiation therapy or antiestrogen therapy versus a mastectomy. There is also no real difference between her recurrence in the breast. We discussed the risks of operation including bleeding, infection, possible reoperation. She understands her further therapy will be based on what her stages at the time of her operation.

## 2021-12-17 NOTE — Anesthesia Postprocedure Evaluation (Signed)
Anesthesia Post Note  Patient: ICIS BUDREAU  Procedure(s) Performed: RIGHT BREAST LUMPECTOMY WITH RADIOACTIVE SEED LOCALIZATION (Right: Breast) RIGHT BREAST SEED BRACKETED EXCISIONAL BIOPSY (Right: Breast) RIGHT AXILLARY SENTINEL LYMPH NODE BIOPSY (Right: Axilla)     Patient location during evaluation: PACU Anesthesia Type: General Level of consciousness: awake and alert Pain management: pain level controlled Vital Signs Assessment: post-procedure vital signs reviewed and stable Respiratory status: spontaneous breathing, nonlabored ventilation and respiratory function stable Cardiovascular status: blood pressure returned to baseline and stable Postop Assessment: no apparent nausea or vomiting Anesthetic complications: no   No notable events documented.  Last Vitals:  Vitals:   12/17/21 1634 12/17/21 1645  BP: 137/76 (!) 153/98  Pulse: 66 63  Resp: 19 13  Temp: 37.1 C   SpO2: 98% 98%    Last Pain:  Vitals:   12/17/21 1645  TempSrc:   PainSc: 7                  Elizaveta Mattice E Fergus Throne

## 2021-12-17 NOTE — Progress Notes (Signed)
Assisted Dr. Woodrum with right, pectoralis, ultrasound guided block. Side rails up, monitors on throughout procedure. See vital signs in flow sheet. Tolerated Procedure well. 

## 2021-12-18 ENCOUNTER — Encounter (HOSPITAL_BASED_OUTPATIENT_CLINIC_OR_DEPARTMENT_OTHER): Payer: Self-pay | Admitting: General Surgery

## 2021-12-18 NOTE — Addendum Note (Signed)
Addendum  created 12/18/21 0829 by Tawni Millers, CRNA   Charge Capture section accepted

## 2021-12-20 ENCOUNTER — Other Ambulatory Visit: Payer: Self-pay | Admitting: Pharmacy Technician

## 2021-12-20 DIAGNOSIS — M8000XA Age-related osteoporosis with current pathological fracture, unspecified site, initial encounter for fracture: Secondary | ICD-10-CM

## 2021-12-24 ENCOUNTER — Encounter: Payer: Self-pay | Admitting: *Deleted

## 2021-12-24 DIAGNOSIS — C50411 Malignant neoplasm of upper-outer quadrant of right female breast: Secondary | ICD-10-CM

## 2021-12-30 NOTE — Progress Notes (Signed)
 Patient Care Team: Fagan, Roy, MD as PCP - General (Internal Medicine) Stuart, Dawn C, RN as Oncology Nurse Navigator Martini, Keisha N, RN as Oncology Nurse Navigator Gudena, Vinay, MD as Consulting Physician (Hematology and Oncology)  DIAGNOSIS:  Encounter Diagnosis  Name Primary?   Malignant neoplasm of upper-outer quadrant of right breast in female, estrogen receptor positive (HCC)     SUMMARY OF ONCOLOGIC HISTORY: Oncology History  Malignant neoplasm of upper-outer quadrant of right breast in female, estrogen receptor positive (HCC)  10/23/2021 Initial Diagnosis   Screening mammogram detected abnormalities in the right breast: 12 o'clock position 0.7 cm: Biopsy: Grade 1 IDC with ALH ER 90%, PR 95%, Ki-67 less than 1%, HER2 1+, the other 2 biopsies were benign (fibrocystic change and focal ADH and ALH)   11/12/2021 Cancer Staging   Staging form: Breast, AJCC 8th Edition - Clinical: Stage IA (cT1b, cN0, cM0, G1, ER+, PR+, HER2-) - Signed by Gudena, Vinay, MD on 11/12/2021 Stage prefix: Initial diagnosis Histologic grading system: 3 grade system   12/17/2021 Surgery   Right lumpectomy: Grade 1 IDC 10 mm, margins negative, 0/1 lymph node negative, ER 90%, PR 95%, HER2 negative 1+, Ki-67 less than 1% Right lumpectomy: Invasive cribriform carcinoma, grade 1, 2 mm, DCIS grade 2 8 mm, margins negative, ER 100%, PR 100%, Ki-67 5%, HER2 1+ negative     CHIEF COMPLIANT: Follow-up after surgery  INTERVAL HISTORY: Christy Flores is a 71 y.o with right breast cancer. She presents to the clinic for a follow-up to discuss pathology report. She had some concerns about the pill she will be taking and the side effects that it has. She has tolerated the surgery extremely well and is without any major pain or discomfort.  She has met with radiation and radiation schedule is already been made.   ALLERGIES:  is allergic to codeine.  MEDICATIONS:  Current Outpatient Medications  Medication Sig  Dispense Refill   acetaminophen (TYLENOL) 500 MG tablet Take 500 mg by mouth every 6 (six) hours as needed for moderate pain.     BIOTIN PO Take 1 tablet by mouth daily.     CALCIUM PO Take 1 tablet by mouth daily.     citalopram (CELEXA) 20 MG tablet Take 20 mg by mouth daily.     Cyanocobalamin (B-12 PO) Take 1 tablet by mouth daily as needed (energy).     cycloSPORINE (RESTASIS) 0.05 % ophthalmic emulsion Place 1 drop into both eyes 2 (two) times daily as needed (dry eyes).     denosumab (PROLIA) 60 MG/ML SOSY injection Inject 60 mg into the skin every 6 (six) months.     Multiple Vitamins-Minerals (MULTIVITAMIN WITH MINERALS) tablet Take 1 tablet by mouth daily.     naproxen sodium (ALEVE) 220 MG tablet Take 220 mg by mouth daily as needed (pain).     Polyvinyl Alcohol-Povidone (REFRESH OP) Place 1 drop into both eyes daily as needed (dry eyes).     traMADol (ULTRAM) 50 MG tablet Take 2 tablets (100 mg total) by mouth every 6 (six) hours as needed. 10 tablet 0   TURMERIC PO Take 1 capsule by mouth daily.     VITAMIN D PO Take 1 tablet by mouth daily.     No current facility-administered medications for this visit.    PHYSICAL EXAMINATION: ECOG PERFORMANCE STATUS: 1 - Symptomatic but completely ambulatory  Vitals:   01/13/22 1126  BP: 116/82  Pulse: 64  Resp: 18  Temp: (!) 97.5   F (36.4 C)  SpO2: 97%   Filed Weights   01/13/22 1126  Weight: 186 lb 1.6 oz (84.4 kg)     LABORATORY DATA:  I have reviewed the data as listed    Latest Ref Rng & Units 06/25/2020   12:40 PM 05/23/2008   11:45 AM  CMP  Glucose 70 - 99 mg/dL 98  97   BUN 8 - 23 mg/dL 14  11   Creatinine 0.44 - 1.00 mg/dL 0.70  0.67   Sodium 135 - 145 mmol/L 139  138   Potassium 3.5 - 5.1 mmol/L 4.3  4.2   Chloride 98 - 111 mmol/L 103  102   CO2   29   Calcium   9.2     Lab Results  Component Value Date   WBC 5.1 12/11/2021   HGB 13.5 12/11/2021   HCT 41.3 12/11/2021   MCV 99.3 12/11/2021   PLT 214  12/11/2021    ASSESSMENT & PLAN:  Malignant neoplasm of upper-outer quadrant of right breast in female, estrogen receptor positive (HCC) 12/17/2021:Right lumpectomy: Grade 1 IDC 10 mm, margins negative, 0/1 lymph node negative, ER 90%, PR 95%, HER2 negative 1+, Ki-67 less than 1% Right lumpectomy: Invasive cribriform carcinoma, grade 1, 2 mm, DCIS grade 2 8 mm, margins negative, ER 100%, PR 100%, Ki-67 5%, HER2 1+ negative  Pathology counseling: I discussed the final pathology report of the patient provided  a copy of this report. I discussed the margins as well as lymph node surgeries. We also discussed the final staging along with previously performed ER/PR and HER-2/neu testing.  Based on the tumor size, we did not recommend Oncotype DX testing. Return to clinic after radiation to discuss antiestrogen therapy    No orders of the defined types were placed in this encounter.  The patient has a good understanding of the overall plan. she agrees with it. she will call with any problems that may develop before the next visit here. Total time spent: 30 mins including face to face time and time spent for planning, charting and co-ordination of care   Viinay K Gudena, MD 01/13/22    I Deritra, Mcnairy am scribing for Dr. Gudena  I have reviewed the above documentation for accuracy and completeness, and I agree with the above.   

## 2022-01-01 ENCOUNTER — Ambulatory Visit (HOSPITAL_COMMUNITY): Payer: Medicare PPO

## 2022-01-03 ENCOUNTER — Encounter (HOSPITAL_COMMUNITY): Payer: Self-pay

## 2022-01-03 ENCOUNTER — Telehealth: Payer: Self-pay | Admitting: Hematology and Oncology

## 2022-01-03 NOTE — Telephone Encounter (Signed)
.  Called patient to schedule appointment per 6/10 inbasket, patient is aware of date and time.

## 2022-01-06 NOTE — Progress Notes (Signed)
Location of Breast Cancer:  Malignant neoplasm of upper-outer quadrant of right breast in female, estrogen receptor positive  Histology per Pathology Report:  12/17/2021 FINAL MICROSCOPIC DIAGNOSIS:  A.   BREAST, RIGHT, LUMPECTOMY:  -    Invasive carcinoma of no special type (ductal), grade 1, 10 mm in greatest dimension, all margins negative for tumor.  -    Lobular neoplasia (ALH/LCIS).  -    Microcalcifications present, associated with invasive carcinoma and lobular neoplasia.  -    Prior biopsy site changes.  B.   LYMPH NODE, RIGHT AXILLARY, SENTINEL, EXCISION:  -    Lymph node, negative for malignancy (0/1).  C.   BREAST, RIGHT LATERAL MARGIN, EXCISION:  -    Breast tissue with no malignancy identified.  D.   BREAST, ADDITIONAL RIGHT LATERAL MARGIN, EXCISION:  -    Breast tissue with no malignancy identified.  E.   BREAST, RIGHT, LUMPECTOMY:  -    Invasive cribriform carcinoma, grade 1, 2 mm in greatest dimension.  -    Ductal carcinoma in situ (DCIS), grade II, with calcifications, at least 8 mm in greatest dimension.  -    All margins negative for invasive carcinoma and DCIS.  -    Invasive carcinoma 4 mm from closest margin (inferior margin).  -    DCIS 1 mm from closest margin (lateral margin).  -    Prior biopsy site changes.  Receptor Status: ER(90%), PR (95%), Her2-neu (Negative), Ki-67(<1%)  Past/Anticipated interventions by surgeon, if any:  01/06/2022 --Dr. Rolm Bookbinder (office visit) Physical Exam  Right breast and ax incisions with some irritation from steristrips but no infection Assessment and Plan:  She is doing well, return full activity, discussed scar massage Will have PT call her for follow up I have asked pathology to do prog panel on second specimen (it stated they were doing this but not present yet). Due to see rad onc this week and plan for no oncotype. I will see again one year Return in about 1 year (around 01/07/2023)  12/17/2021 --Dr.  Rolm Bookbinder Right breast radioactive seed guided lumpectomy Right deep axillary sentinel lymph node biopsy Injection of mag trace for sentinel lymph node identification Right breast seed bracketed excisional biopsy  Past/Anticipated interventions by medical oncology, if any:  Under care of Dr. Nicholas Lose 11/12/2021 --Recommendations: 1. Breast conserving surgery followed by 2. Adjuvant radiation therapy followed by 3. Adjuvant antiestrogen therapy Patient is getting a breast MRI to determine if she needs more extensive surgery. I discussed with her that unless her tumor is greater than 1 cm or higher grade she would not need chemotherapy.   Therefore Oncotype is not planned to be sent.   We will await to see the final pathology before deciding on Oncotype. Return to clinic after surgery to discuss final pathology report **F/U scheduled for 01/13/2022  Lymphedema issues, if any:  Patient denies (other than very mild swelling around lumpectomy site)    Pain issues, if any:  Reports occasional discomfort to axilla, but doesn't feel it impacts her range of motion   SAFETY ISSUES: Prior radiation? No Pacemaker/ICD? No Possible current pregnancy? No--hysterectomy Is the patient on methotrexate? No  Current Complaints / other details:  Reports mild irritation/rash to skin glue and steri-strips, Dr. Donne Hazel is aware and recommended patient apply vitamin E oil to area

## 2022-01-06 NOTE — Progress Notes (Signed)
Radiation Oncology         [phone] ________________________________  Name: Christy Flores MRN: [id number]  Date: 01/08/2022  DOB: Apr 17, 1951  Follow-Up Visit Note  Outpatient  CC: Asencion Noble, MD  Nicholas Lose, MD  Diagnosis:   No diagnosis found.   S/p lumpectomy:   Stage IA (cT1b, cN0, cM0) Right Breast UOQ, Invasive Ductal Carcinoma, ER+ / PR+ / Her2-, Grade 1  Slightly Inner Right breast, Invasive ductal carcinoma with intermediate grade DCIS  CHIEF COMPLAINT: Here to discuss management of right breast cancer  Narrative:  The patient returns today for follow-up.     Since consultation date of 11/20/21, she underwent a biopsy of the slightly inner - medial right breast on 11/27/21 which revealed no evidence of malignancy with atypical lobular hyperplasia and a single noncaseating granuloma.   The patient opted to proceed with right breast lumpectomies of 2 different sites, with nodal biopsies on 12/17/21 under the care of Dr. Donne Hazel. Pathology from the first lumpectomy in the 12 o'clock right breast revealed: tumor size of 10 mm; histology of grade 1 invasive ductal carcinoma with lobular neoplasia and microcalcifications; all margins negative for carcinoma; margin status to invasive disease of 13 mm from the lateral margin; nodal status of 1/1 right axillary sentinel lymph nodes excision negative for carcinoma;  ER status: 90% positive with moderate-strong staining intensity; PR status 95% positive with strong staining intensity; Proliferation marker Ki67 at <1%; Her2 status negative; Grade 1. Pathology from the additional right breast lumpectomy in the slightly inner right breast revealed: tumor the size of 2 mm; histology of grade 1 invasive cribriform carcinoma with intermediate grade ductal carcinoma in-situ; all margins negative for both invasive and in-situ disease; margin status to invasive disease of 4 mm from the inferior margin; margin status to in-situ disease of 1  mm from the lateral margin.   Symptomatically, the patient reports: ***        ALLERGIES:  is allergic to codeine.  Meds: Current Outpatient Medications  Medication Sig Dispense Refill   acetaminophen (TYLENOL) 500 MG tablet Take 500 mg by mouth every 6 (six) hours as needed for moderate pain.     BIOTIN PO Take 1 tablet by mouth daily.     CALCIUM PO Take 1 tablet by mouth daily.     citalopram (CELEXA) 20 MG tablet Take 20 mg by mouth daily.     Cyanocobalamin (B-12 PO) Take 1 tablet by mouth daily as needed (energy).     cycloSPORINE (RESTASIS) 0.05 % ophthalmic emulsion Place 1 drop into both eyes 2 (two) times daily as needed (dry eyes).     denosumab (PROLIA) 60 MG/ML SOSY injection Inject 60 mg into the skin every 6 (six) months.     Multiple Vitamins-Minerals (MULTIVITAMIN WITH MINERALS) tablet Take 1 tablet by mouth daily.     naproxen sodium (ALEVE) 220 MG tablet Take 220 mg by mouth daily as needed (pain).     Polyvinyl Alcohol-Povidone (REFRESH OP) Place 1 drop into both eyes daily as needed (dry eyes).     traMADol (ULTRAM) 50 MG tablet Take 2 tablets (100 mg total) by mouth every 6 (six) hours as needed. 10 tablet 0   TURMERIC PO Take 1 capsule by mouth daily.     VITAMIN D PO Take 1 tablet by mouth daily.     No current facility-administered medications for this encounter.    Physical Findings:  vitals were not taken for this visit. Marland Kitchen  General: Alert and oriented, in no acute distress HEENT: Head is normocephalic. Extraocular movements are intact. Oropharynx is clear. Neck: Neck is supple, no palpable cervical or supraclavicular lymphadenopathy. Heart: Regular in rate and rhythm with no murmurs, rubs, or gallops. Chest: Clear to auscultation bilaterally, with no rhonchi, wheezes, or rales. Abdomen: Soft, nontender, nondistended, with no rigidity or guarding. Extremities: No cyanosis or edema. Lymphatics: see Neck Exam Musculoskeletal: symmetric strength and  muscle tone throughout. Neurologic: No obvious focalities. Speech is fluent.  Psychiatric: Judgment and insight are intact. Affect is appropriate. Breast exam reveals ***  Lab Findings: Lab Results  Component Value Date   WBC 5.1 12/11/2021   HGB 13.5 12/11/2021   HCT 41.3 12/11/2021   MCV 99.3 12/11/2021   PLT 214 12/11/2021    '@LASTCHEMISTRY' @  Radiographic Findings: MM Breast Surgical Specimen  Result Date: 12/17/2021 CLINICAL DATA:  Patient status post right breast lumpectomy. EXAM: SPECIMEN RADIOGRAPH OF THE RIGHT BREAST COMPARISON:  None Available. FINDINGS: Status post excision of the right breast. The radioactive seeds and biopsy marker clips are present and completely intact. Note the ribbon clip, X clip, barbell clip and coil clip were all localized within the specimen. Additionally all 3 radioactive seeds were identified. IMPRESSION: Specimen radiograph of the right breast. Electronically Signed   By: Lovey Newcomer M.D.   On: 12/17/2021 16:11  MM Breast Surgical Specimen  Result Date: 12/17/2021 CLINICAL DATA:  Patient status post right breast lumpectomy. EXAM: SPECIMEN RADIOGRAPH OF THE RIGHT BREAST COMPARISON:  None Available. FINDINGS: Status post excision of the right breast. The radioactive seeds and biopsy marker clips are present and completely intact. Note the ribbon clip, X clip, barbell clip and coil clip were all localized within the specimen. Additionally all 3 radioactive seeds were identified. IMPRESSION: Specimen radiograph of the right breast. Electronically Signed   By: Lovey Newcomer M.D.   On: 12/17/2021 16:11  MM RT RADIOACTIVE SEED LOC MAMMO GUIDE  Result Date: 12/16/2021 CLINICAL DATA:  71 year old female who has undergone multiple right biopsies. 1. Ribbon shaped clip- invasive ductal carcinoma and focal atypical lobular hyperplasia. 2. Coil shaped clip-atypical ductal hyperplasia and calcifications, atypical lobular hyperplasia, negative for invasive  carcinoma. 3. Barbell shaped clip-lobular neoplasia (atypical lobular hyperplasia). 4.  X shaped clip-fibrocystic change with calcifications/benign. EXAM: MAMMOGRAPHIC GUIDED RADIOACTIVE SEED LOCALIZATIONS OF THE RIGHT BREAST COMPARISON:  With priors. FINDINGS: Patient presents for radioactive seed localization prior to surgery. I met with the patient and we discussed the procedure of seed localization including benefits and alternatives. We discussed the high likelihood of a successful procedure. We discussed the risks of the procedure including infection, bleeding, tissue injury and further surgery. We discussed the low dose of radioactivity involved in the procedure. Informed, written consent was given. The usual time-out protocol was performed immediately prior to the procedure. Using mammographic guidance, sterile technique, 1% lidocaine and an I-125 radioactive seed, the ribbon shaped clip was localized using a lateral to medial approach. The follow-up mammogram images confirm the seed in the expected location and were marked for Dr. Donne Hazel. Follow-up survey of the patient confirms presence of the radioactive seed. Order number of I-125 seed:  [id number]. Total activity:  5.397 millicuries reference Date: 11/25/2021 The patient tolerated the procedure well and was released from the [facility]. She was given instructions regarding seed removal. Patient presents for radioactive seed localization prior to surgery. I met with the patient and we discussed the procedure of seed localization including benefits and alternatives. We  discussed the high likelihood of a successful procedure. We discussed the risks of the procedure including infection, bleeding, tissue injury and further surgery. We discussed the low dose of radioactivity involved in the procedure. Informed, written consent was given. The usual time-out protocol was performed immediately prior to the procedure. Using mammographic guidance, sterile  technique, 1% lidocaine and an I-125 radioactive seed, coil shaped clip was localized using a medial to lateral approach. The follow-up mammogram images confirm the seed in the expected location and were marked for Dr. Donne Hazel. Follow-up survey of the patient confirms presence of the radioactive seed. Order number of I-125 seed:  [id number]. Total activity:  6.283 millicuries reference Date: 12/03/2021 The patient tolerated the procedure well and was released from the [facility]. She was given instructions regarding seed removal. Patient presents for radioactive seed localization prior to surgery. I met with the patient and we discussed the procedure of seed localization including benefits and alternatives. We discussed the high likelihood of a successful procedure. We discussed the risks of the procedure including infection, bleeding, tissue injury and further surgery. We discussed the low dose of radioactivity involved in the procedure. Informed, written consent was given. The usual time-out protocol was performed immediately prior to the procedure. Using mammographic guidance, sterile technique, 1% lidocaine and an I-125 radioactive seed, barbell shaped clip was localized using a medial to lateral approach. The follow-up mammogram images confirm the seed in the expected location and were marked for Dr. Donne Hazel. Follow-up survey of the patient confirms presence of the radioactive seed. Order number of I-125 seed:  [id number]. Total activity:  3.710 millicuries reference Date: 11/25/2021 The patient tolerated the procedure well and was released from the [facility]. She was given instructions regarding seed removal. IMPRESSION: Radioactive seed localizations of the right breast. No apparent complications. Electronically Signed   By: Lillia Mountain M.D.   On: 12/16/2021 14:53  MM RT RADIO SEED EA ADD LESION LOC MAMMO  Result Date: 12/16/2021 CLINICAL DATA:  71 year old female who has undergone multiple right  biopsies. 1. Ribbon shaped clip- invasive ductal carcinoma and focal atypical lobular hyperplasia. 2. Coil shaped clip-atypical ductal hyperplasia and calcifications, atypical lobular hyperplasia, negative for invasive carcinoma. 3. Barbell shaped clip-lobular neoplasia (atypical lobular hyperplasia). 4.  X shaped clip-fibrocystic change with calcifications/benign. EXAM: MAMMOGRAPHIC GUIDED RADIOACTIVE SEED LOCALIZATIONS OF THE RIGHT BREAST COMPARISON:  With priors. FINDINGS: Patient presents for radioactive seed localization prior to surgery. I met with the patient and we discussed the procedure of seed localization including benefits and alternatives. We discussed the high likelihood of a successful procedure. We discussed the risks of the procedure including infection, bleeding, tissue injury and further surgery. We discussed the low dose of radioactivity involved in the procedure. Informed, written consent was given. The usual time-out protocol was performed immediately prior to the procedure. Using mammographic guidance, sterile technique, 1% lidocaine and an I-125 radioactive seed, the ribbon shaped clip was localized using a lateral to medial approach. The follow-up mammogram images confirm the seed in the expected location and were marked for Dr. Donne Hazel. Follow-up survey of the patient confirms presence of the radioactive seed. Order number of I-125 seed:  [id number]. Total activity:  2.703 millicuries reference Date: 11/25/2021 The patient tolerated the procedure well and was released from the [facility]. She was given instructions regarding seed removal. Patient presents for radioactive seed localization prior to surgery. I met with the patient and we discussed the procedure of seed localization including benefits and alternatives. We  discussed the high likelihood of a successful procedure. We discussed the risks of the procedure including infection, bleeding, tissue injury and further surgery. We  discussed the low dose of radioactivity involved in the procedure. Informed, written consent was given. The usual time-out protocol was performed immediately prior to the procedure. Using mammographic guidance, sterile technique, 1% lidocaine and an I-125 radioactive seed, coil shaped clip was localized using a medial to lateral approach. The follow-up mammogram images confirm the seed in the expected location and were marked for Dr. Donne Hazel. Follow-up survey of the patient confirms presence of the radioactive seed. Order number of I-125 seed:  [id number]. Total activity:  4.128 millicuries reference Date: 12/03/2021 The patient tolerated the procedure well and was released from the [facility]. She was given instructions regarding seed removal. Patient presents for radioactive seed localization prior to surgery. I met with the patient and we discussed the procedure of seed localization including benefits and alternatives. We discussed the high likelihood of a successful procedure. We discussed the risks of the procedure including infection, bleeding, tissue injury and further surgery. We discussed the low dose of radioactivity involved in the procedure. Informed, written consent was given. The usual time-out protocol was performed immediately prior to the procedure. Using mammographic guidance, sterile technique, 1% lidocaine and an I-125 radioactive seed, barbell shaped clip was localized using a medial to lateral approach. The follow-up mammogram images confirm the seed in the expected location and were marked for Dr. Donne Hazel. Follow-up survey of the patient confirms presence of the radioactive seed. Order number of I-125 seed:  [id number]. Total activity:  4.709 millicuries reference Date: 11/25/2021 The patient tolerated the procedure well and was released from the [facility]. She was given instructions regarding seed removal. IMPRESSION: Radioactive seed localizations of the right breast. No apparent  complications. Electronically Signed   By: Lillia Mountain M.D.   On: 12/16/2021 14:53  MM RT RADIO SEED EA ADD LESION LOC MAMMO  Result Date: 12/16/2021 CLINICAL DATA:  71 year old female who has undergone multiple right biopsies. 1. Ribbon shaped clip- invasive ductal carcinoma and focal atypical lobular hyperplasia. 2. Coil shaped clip-atypical ductal hyperplasia and calcifications, atypical lobular hyperplasia, negative for invasive carcinoma. 3. Barbell shaped clip-lobular neoplasia (atypical lobular hyperplasia). 4.  X shaped clip-fibrocystic change with calcifications/benign. EXAM: MAMMOGRAPHIC GUIDED RADIOACTIVE SEED LOCALIZATIONS OF THE RIGHT BREAST COMPARISON:  With priors. FINDINGS: Patient presents for radioactive seed localization prior to surgery. I met with the patient and we discussed the procedure of seed localization including benefits and alternatives. We discussed the high likelihood of a successful procedure. We discussed the risks of the procedure including infection, bleeding, tissue injury and further surgery. We discussed the low dose of radioactivity involved in the procedure. Informed, written consent was given. The usual time-out protocol was performed immediately prior to the procedure. Using mammographic guidance, sterile technique, 1% lidocaine and an I-125 radioactive seed, the ribbon shaped clip was localized using a lateral to medial approach. The follow-up mammogram images confirm the seed in the expected location and were marked for Dr. Donne Hazel. Follow-up survey of the patient confirms presence of the radioactive seed. Order number of I-125 seed:  [id number]. Total activity:  7.654 millicuries reference Date: 11/25/2021 The patient tolerated the procedure well and was released from the [facility]. She was given instructions regarding seed removal. Patient presents for radioactive seed localization prior to surgery. I met with the patient and we discussed the procedure of seed  localization including benefits and alternatives.  We discussed the high likelihood of a successful procedure. We discussed the risks of the procedure including infection, bleeding, tissue injury and further surgery. We discussed the low dose of radioactivity involved in the procedure. Informed, written consent was given. The usual time-out protocol was performed immediately prior to the procedure. Using mammographic guidance, sterile technique, 1% lidocaine and an I-125 radioactive seed, coil shaped clip was localized using a medial to lateral approach. The follow-up mammogram images confirm the seed in the expected location and were marked for Dr. Donne Hazel. Follow-up survey of the patient confirms presence of the radioactive seed. Order number of I-125 seed:  [id number]. Total activity:  0.092 millicuries reference Date: 12/03/2021 The patient tolerated the procedure well and was released from the [facility]. She was given instructions regarding seed removal. Patient presents for radioactive seed localization prior to surgery. I met with the patient and we discussed the procedure of seed localization including benefits and alternatives. We discussed the high likelihood of a successful procedure. We discussed the risks of the procedure including infection, bleeding, tissue injury and further surgery. We discussed the low dose of radioactivity involved in the procedure. Informed, written consent was given. The usual time-out protocol was performed immediately prior to the procedure. Using mammographic guidance, sterile technique, 1% lidocaine and an I-125 radioactive seed, barbell shaped clip was localized using a medial to lateral approach. The follow-up mammogram images confirm the seed in the expected location and were marked for Dr. Donne Hazel. Follow-up survey of the patient confirms presence of the radioactive seed. Order number of I-125 seed:  [id number]. Total activity:  3.335 millicuries reference Date:  11/25/2021 The patient tolerated the procedure well and was released from the [facility]. She was given instructions regarding seed removal. IMPRESSION: Radioactive seed localizations of the right breast. No apparent complications. Electronically Signed   By: Lillia Mountain M.D.   On: 12/16/2021 14:53   Impression/Plan: We discussed adjuvant radiotherapy today.  I recommend *** in order to ***.  I reviewed the logistics, benefits, risks, and potential side effects of this treatment in detail. Risks may include but not necessary be limited to acute and late injury tissue in the radiation fields such as skin irritation (change in color/pigmentation, itching, dryness, pain, peeling). She may experience fatigue. We also discussed possible risk of long term cosmetic changes or scar tissue. There is also a smaller risk for lung toxicity, ***cardiac toxicity, ***brachial plexopathy, ***lymphedema, ***musculoskeletal changes, ***rib fragility or ***induction of a second malignancy, ***late chronic non-healing soft tissue wound.    The patient asked good questions which I answered to her satisfaction. She is enthusiastic about proceeding with treatment. A consent form has been *** signed and placed in her chart.  A total of *** medically necessary complex treatment devices will be fabricated and supervised by me: *** fields with MLCs for custom blocks to protect heart, and lungs;  and, a Vac-lok. MORE COMPLEX DEVICES MAY BE MADE IN DOSIMETRY FOR FIELD IN FIELD BEAMS FOR DOSE HOMOGENEITY.  I have requested : 3D Simulation which is medically necessary to give adequate dose to at risk tissues while sparing lungs and heart.  I have requested a DVH of the following structures: lungs, heart, *** lumpectomy cavity.    The patient will receive *** Gy in *** fractions to the *** with *** fields.  This will be *** followed by a boost.  On date of service, in total, I spent *** minutes on this encounter. Patient was seen in  person.  _____________________________________   Eppie Gibson, MD  This document serves as a record of services personally performed by Eppie Gibson, MD. It was created on her behalf by Roney Mans, a trained medical scribe. The creation of this record is based on the scribe's personal observations and the provider's statements to them. This document has been checked and approved by the attending provider.

## 2022-01-08 ENCOUNTER — Ambulatory Visit
Admission: RE | Admit: 2022-01-08 | Discharge: 2022-01-08 | Disposition: A | Discharge status: Home / Self Care | Payer: Medicare PPO | Source: Ambulatory Visit | Attending: Radiation Oncology | Admitting: Radiation Oncology

## 2022-01-08 ENCOUNTER — Encounter: Payer: Self-pay | Admitting: Radiation Oncology

## 2022-01-08 ENCOUNTER — Other Ambulatory Visit: Payer: Self-pay

## 2022-01-08 VITALS — BP 132/74 | HR 69 | Temp 97.9°F | Resp 18 | Ht 64.0 in | Wt 187.0 lb

## 2022-01-08 DIAGNOSIS — C50411 Malignant neoplasm of upper-outer quadrant of right female breast: Secondary | ICD-10-CM | POA: Diagnosis not present

## 2022-01-08 DIAGNOSIS — Z79899 Other long term (current) drug therapy: Secondary | ICD-10-CM | POA: Diagnosis not present

## 2022-01-08 DIAGNOSIS — Z170 Estrogen receptor positive status [ER+]: Secondary | ICD-10-CM | POA: Insufficient documentation

## 2022-01-08 LAB — SURGICAL PATHOLOGY

## 2022-01-09 ENCOUNTER — Encounter: Payer: Self-pay | Admitting: *Deleted

## 2022-01-09 ENCOUNTER — Ambulatory Visit (INDEPENDENT_AMBULATORY_CARE_PROVIDER_SITE_OTHER): Payer: Medicare PPO

## 2022-01-09 VITALS — BP 125/77 | HR 74 | Temp 97.3°F | Resp 18 | Ht 64.0 in | Wt 189.0 lb

## 2022-01-09 DIAGNOSIS — M8000XA Age-related osteoporosis with current pathological fracture, unspecified site, initial encounter for fracture: Secondary | ICD-10-CM

## 2022-01-09 MED ORDER — DENOSUMAB 60 MG/ML ~~LOC~~ SOSY
60.0000 mg | PREFILLED_SYRINGE | Freq: Once | SUBCUTANEOUS | Status: AC
  Administered 2022-01-09: 60 mg via SUBCUTANEOUS

## 2022-01-09 NOTE — Progress Notes (Signed)
Diagnosis: Osteoporosis  Provider:  Praveen Mannam, MD  Procedure: Injection  Prolia (Denosumab), Dose: 60 mg, Site: subcutaneous, Number of injections: 1  Discharge: Condition: Good, Destination: Home . AVS provided to patient.   Performed by:  Ari Engelbrecht, RN       

## 2022-01-10 ENCOUNTER — Ambulatory Visit
Admission: RE | Admit: 2022-01-10 | Discharge: 2022-01-10 | Disposition: A | Discharge status: Home / Self Care | Payer: Medicare PPO | Source: Ambulatory Visit | Attending: Radiation Oncology | Admitting: Radiation Oncology

## 2022-01-10 ENCOUNTER — Other Ambulatory Visit: Payer: Self-pay

## 2022-01-10 DIAGNOSIS — C50411 Malignant neoplasm of upper-outer quadrant of right female breast: Secondary | ICD-10-CM | POA: Diagnosis not present

## 2022-01-10 DIAGNOSIS — Z510 Encounter for antineoplastic radiation therapy: Secondary | ICD-10-CM | POA: Diagnosis not present

## 2022-01-10 DIAGNOSIS — Z170 Estrogen receptor positive status [ER+]: Secondary | ICD-10-CM | POA: Diagnosis not present

## 2022-01-13 ENCOUNTER — Other Ambulatory Visit: Payer: Self-pay

## 2022-01-13 ENCOUNTER — Inpatient Hospital Stay: Payer: Medicare PPO | Attending: Hematology and Oncology | Admitting: Hematology and Oncology

## 2022-01-13 DIAGNOSIS — C50411 Malignant neoplasm of upper-outer quadrant of right female breast: Secondary | ICD-10-CM | POA: Insufficient documentation

## 2022-01-13 DIAGNOSIS — Z170 Estrogen receptor positive status [ER+]: Secondary | ICD-10-CM | POA: Diagnosis not present

## 2022-01-13 DIAGNOSIS — Z79899 Other long term (current) drug therapy: Secondary | ICD-10-CM | POA: Insufficient documentation

## 2022-01-13 NOTE — Assessment & Plan Note (Signed)
12/17/2021:Right lumpectomy: Grade 1 IDC 10 mm, margins negative, 0/1 lymph node negative, ER 90%, PR 95%, HER2 negative 1+, Ki-67 less than 1% Right lumpectomy: Invasive cribriform carcinoma, grade 1, 2 mm, DCIS grade 2 8 mm, margins negative, ER 100%, PR 100%, Ki-67 5%, HER2 1+ negative  Pathology counseling: I discussed the final pathology report of the patient provided  a copy of this report. I discussed the margins as well as lymph node surgeries. We also discussed the final staging along with previously performed ER/PR and HER-2/neu testing.  Based on the tumor size, we did not recommend Oncotype DX testing. Return to clinic after radiation to discuss antiestrogen therapy

## 2022-01-14 ENCOUNTER — Telehealth: Payer: Self-pay | Admitting: Hematology and Oncology

## 2022-01-14 NOTE — Telephone Encounter (Signed)
Scheduled appointment per 7/10 los. Patient is aware.

## 2022-01-16 ENCOUNTER — Encounter: Payer: Self-pay | Admitting: *Deleted

## 2022-01-17 ENCOUNTER — Ambulatory Visit: Payer: Medicare PPO | Admitting: Rehabilitation

## 2022-01-21 DIAGNOSIS — C50411 Malignant neoplasm of upper-outer quadrant of right female breast: Secondary | ICD-10-CM | POA: Diagnosis not present

## 2022-01-21 DIAGNOSIS — Z510 Encounter for antineoplastic radiation therapy: Secondary | ICD-10-CM | POA: Diagnosis not present

## 2022-01-21 DIAGNOSIS — Z170 Estrogen receptor positive status [ER+]: Secondary | ICD-10-CM | POA: Diagnosis not present

## 2022-01-22 ENCOUNTER — Ambulatory Visit
Admission: RE | Admit: 2022-01-22 | Discharge: 2022-01-22 | Disposition: A | Discharge status: Home / Self Care | Payer: Medicare PPO | Source: Ambulatory Visit | Attending: Radiation Oncology | Admitting: Radiation Oncology

## 2022-01-22 ENCOUNTER — Ambulatory Visit: Payer: Medicare PPO | Attending: General Surgery | Admitting: Physical Therapy

## 2022-01-22 ENCOUNTER — Encounter: Payer: Self-pay | Admitting: Physical Therapy

## 2022-01-22 ENCOUNTER — Other Ambulatory Visit: Payer: Self-pay

## 2022-01-22 DIAGNOSIS — Z170 Estrogen receptor positive status [ER+]: Secondary | ICD-10-CM | POA: Diagnosis not present

## 2022-01-22 DIAGNOSIS — Z510 Encounter for antineoplastic radiation therapy: Secondary | ICD-10-CM | POA: Diagnosis not present

## 2022-01-22 DIAGNOSIS — C50111 Malignant neoplasm of central portion of right female breast: Secondary | ICD-10-CM | POA: Insufficient documentation

## 2022-01-22 DIAGNOSIS — Z483 Aftercare following surgery for neoplasm: Secondary | ICD-10-CM | POA: Insufficient documentation

## 2022-01-22 DIAGNOSIS — R293 Abnormal posture: Secondary | ICD-10-CM | POA: Insufficient documentation

## 2022-01-22 DIAGNOSIS — R222 Localized swelling, mass and lump, trunk: Secondary | ICD-10-CM | POA: Diagnosis not present

## 2022-01-22 DIAGNOSIS — C50411 Malignant neoplasm of upper-outer quadrant of right female breast: Secondary | ICD-10-CM | POA: Diagnosis not present

## 2022-01-22 LAB — RAD ONC ARIA SESSION SUMMARY
Course Elapsed Days: 0
Plan Fractions Treated to Date: 1
Plan Prescribed Dose Per Fraction: 2.66 Gy
Plan Total Fractions Prescribed: 16
Plan Total Prescribed Dose: 42.56 Gy
Reference Point Dosage Given to Date: 2.66 Gy
Reference Point Session Dosage Given: 2.66 Gy
Session Number: 1

## 2022-01-22 NOTE — Patient Instructions (Addendum)
[email or web address] Lymphatic flow series with Shoosh Lettick Crotzer     SHOULDER: Flexion - Supine (Cane)        Cancer Rehab [phone]    Hold cane in both hands. Raise arms up overhead. Do not allow back to arch. Hold _5__ seconds. Do __5-10__ times; __1-2__ times a day.   SELF ASSISTED WITH OBJECT: Shoulder Abduction / Adduction - Supine    Hold cane with both hands. Move both arms from side to side, keep elbows straight.  Hold when stretch felt for __5__ seconds. Repeat __5-10__ times; __1-2__ times a day. Once this becomes easier progress to third picture bringing affected arm towards ear by staying out to side. Same hold for _5_seconds. Repeat  _5-10_ times, _1-2_ times/day.  Shoulder Blade Stretch    Clasp fingers behind head with elbows touching in front of face. Pull elbows back while pressing shoulder blades together. Relax and hold as tolerated, can place pillow under elbow here for comfort as needed and to allow for prolonged stretch.  Repeat __5__ times. Do __1-2__ sessions per day.           [facility]  [address], [address]  [place] [address]  [phone]  After Breast Cancer Class It is recommended you attend the ABC class to be educated on lymphedema risk reduction. This class is free of charge and lasts for 1 hour. It is a 1-time class. You will need to download the Webex app either on your phone or computer. We will send you a link the night before or the morning of the class. You should be able to click on that link to join the class. This is not a confidential class. You don't have to turn your camera on, but other participants may be able to see your email address.  Scar massage You can begin gentle scar massage to you incision sites. Gently place one hand on the incision and move the skin (without sliding on the skin) in various directions. Do this for a few minutes and then you can gently massage either coconut oil or vitamin E  cream into the scars.  Compression garment You should continue wearing your compression bra until you feel like you no longer have swelling.  Home exercise Program Continue doing the exercises you were given until you feel like you can do them without feeling any tightness at the end.   Walking Program Studies show that 30 minutes of walking per day (fast enough to elevate your heart rate) can significantly reduce the risk of a cancer recurrence. If you can't walk due to other medical reasons, we encourage you to find another activity you could do (like a stationary bike or water exercise).  Posture After breast cancer surgery, people frequently sit with rounded shoulders posture because it puts their incisions on slack and feels better. If you sit like this and scar tissue forms in that position, you can become very tight and have pain sitting or standing with good posture. Try to be aware of your posture and sit and stand up tall to heal properly.  Follow up PT: It is recommended you return every 3 months for the first 2 years following surgery to be assessed on the SOZO machine for an L-Dex score. This helps prevent clinically significant lymphedema in 95% of patients. These follow up screens are 10 minute appointments that you are not billed for.

## 2022-01-22 NOTE — Therapy (Signed)
OUTPATIENT PHYSICAL THERAPY BREAST CANCER POST OP FOLLOW UP   Patient Name: Christy Flores MRN: [id number] DOB:[birth year], 71 y.o., female Today's Date: 01/22/2022   PT End of Session - 01/22/22 1054     Visit Number 2    Number of Visits 6    Date for PT Re-Evaluation 02/07/22    Authorization Type [employer]    Authorization - Visit Number 2    PT Start Time 0900    PT Stop Time 1102    PT Time Calculation (min) 50 min    Activity Tolerance Patient tolerated treatment well    Behavior During Therapy WFL for tasks assessed/performed             Past Medical History:  Diagnosis Date   GERD (gastroesophageal reflux disease)    Senile osteoporosis    Sleep apnea    on CPAP   Past Surgical History:  Procedure Laterality Date   ABDOMINAL HYSTERECTOMY     AXILLARY LYMPH NODE BIOPSY Right 12/17/2021   Procedure: RIGHT AXILLARY SENTINEL LYMPH NODE BIOPSY;  Surgeon: Rolm Bookbinder, MD;  Location: [place];  Service: General;  Laterality: Right;   BACK SURGERY     BREAST BIOPSY Right 11/27/2021   BREAST BIOPSY Right 11/06/2021   times 2   BREAST LUMPECTOMY WITH RADIOACTIVE SEED LOCALIZATION Right 12/17/2021   Procedure: RIGHT BREAST LUMPECTOMY WITH RADIOACTIVE SEED LOCALIZATION;  Surgeon: Rolm Bookbinder, MD;  Location: [place];  Service: General;  Laterality: Right;  [place]   lasik   RADIOACTIVE SEED GUIDED EXCISIONAL BREAST BIOPSY Right 12/17/2021   Procedure: RIGHT BREAST SEED BRACKETED EXCISIONAL BIOPSY;  Surgeon: Rolm Bookbinder, MD;  Location: [place];  Service: General;  Laterality: Right;   RHINOPLASTY     TUBAL LIGATION     Patient Active Problem List   Diagnosis Date Noted   Osteoporosis with pathological fracture 12/20/2021   Malignant neoplasm of upper-outer quadrant of right breast in female, estrogen receptor positive ([place]) 11/12/2021    PCP: Asencion Noble, MD  REFERRING PROVIDER: Rolm Bookbinder, MD  REFERRING DIAG:  Right Breast Cancer  THERAPY DIAG:  Malignant neoplasm of central portion of right breast in female, estrogen receptor positive ([place])  Abnormal posture  Aftercare following surgery for neoplasm  Localized swelling, mass and lump, trunk  Rationale for Evaluation and Treatment Rehabilitation  ONSET DATE: 11/08/2022  SUBJECTIVE:  SUBJECTIVE STATEMENT:  Pt states she just doesn't feel good.  She has is having some tightness and pulling down into the upper arm , armpit  and across the chest .  She has had a problem with a rash that even got to her nipple and caused bleeding around the nipple    PERTINENT HISTORY:  Patient was diagnosed on 11/07/2021 with right Invasive Ductal Carcinoma grade 1. It measures .7  and . 3  cm and is located in the central quadrant. It is ER+, PR=+,Her 2- with a  low Ki67 .She has a sister with breast cancer in her 91s who she thinks may have some unknown genetic mutation. She has a maternal grandmother and 5 maternal aunts with breast cancer. She thinks her mother had ovarian cancer. She has a brother and sister with colon cancer. She has a brother and her father both having melanoma.  She had a right lumpectomy x 2 on 12/17/2021 with 0/1 lymph nodes removed.  Pt had difficulty with skin rash post op  PATIENT GOALS:  Reassess how my recovery is going related to arm function, pain, and swelling.  PAIN:  Are you having pain?  Yes in back of right arm and across chest with pulling and tighness. She did not rate Nothing makes it worse, but exercise seems to help   PRECAUTIONS: Recent Surgery, right UE Lymphedema risk,   ACTIVITY LEVEL / LEISURE: pt has been doing her post op shoulder exercises anytime its hurting,  about 3-4 times a day. She has not been able to walk because it has been so hot and had been irritating her rash.   OBJECTIVE:   PATIENT SURVEYS:  QUICK DASH: 43.18  OBSERVATIONS:  Pt with red dots on breast with scabbed area at incision nipple area. Full area observed above incision at axilla.  Pt appears to have low energy and not feel well. She says she had a low grade fever yesterday but does not have one today. She has not been wearing a bra due to irritation of her skin, but she does have a compression bra at home . Full area at axilla becomes more prominent when pt is in left sidelying.    POSTURE: foreard head, forward shoulders, decreased thoracic kyphosis   UPPER EXTREMITY AROM/PROM:   A/PROM RIGHT  11/12/2021   RIGHT 01/22/2022   Shoulder extension 65   Shoulder flexion 148 138  Shoulder abduction 170 155  Shoulder internal rotation 70   Shoulder external rotation 100                           (Blank rows = not tested)   A/PROM LEFT  11/12/2021  Shoulder extension 57  Shoulder flexion 150  Shoulder abduction 165  Shoulder internal rotation 70  Shoulder external rotation 85                          (Blank rows = not tested)     CERVICAL AROM: All within functional limits: 40-50% limitations UPPER EXTREMITY STRENGTH: WFL, but pt seems to have some muscle atrophy      LYMPHEDEMA ASSESSMENTS:    LANDMARK RIGHT  11/12/2021 RIGHT 01/22/2022  10 cm proximal to olecranon process 32.3 33  Olecranon process 27.'2 27  10 ' cm proximal to ulnar styloid process 23.7 24.3  Just proximal to ulnar styloid process 17.9 17.5  Across hand at thumb web space 19.9  20  At base of 2nd digit 6.9 7  (Blank rows = not tested)   LANDMARK LEFT  11/12/2021  10 cm proximal to olecranon process 34.5  Olecranon process 27.5  10 cm proximal to ulnar styloid process 23.8  Just proximal to ulnar styloid process 17.3  Across hand at thumb web space 18.8  At base of 2nd digit 6.7  (Blank rows =  not tested)      LYMPHEDEMA ASSESSMENT:   Surgery type/Date: 12/17/2021 Number of lymph nodes removed: 1 Current/past treatment (chemo, radiation, hormone therapy): Radiation to begin on July 19 for 4 weeks  Other symptoms:  Heaviness/tightness Yes Pain Yes Pitting edema No Infections No pt has had a post op rash on chest, breast and arm  Decreased scar mobility not tested due to recent surgery  Stemmer sign No TREATMENT TODAY: instructed in supine dowel exercises to improve shoulder ROM. Briefly, performed MLD to right chest and axilla: short neck, left axillary nodes and interaxillay anastamosis, right inguinal nodes and left axillo-inguinal anastamosis, right axilla. Also instructed in self MLD technique and to start wearing her compression bra up into axilla with tg soft given to pad the top of bra which was irritating to her     PATIENT EDUCATION:  Education details: how to access lymphatic flow series on youtube, supine dowel exercise, briefly about ABC class  Person educated: Patient Education method: Explanation handout  Education comprehension: verbalized understanding and returned demonstration   HOME EXERCISE PROGRAM:  Reviewed previously given post op HEP. Added supine dowel exercises and link to lymphatic flow series   ASSESSMENT:  CLINICAL IMPRESSION: Pt comes back to PT for her post op visits. She has been struggling with a rash on her chest and arm but she says it is improving.  She has fullness in her right axilla just above incision.   Pt will benefit from skilled therapeutic intervention to improve on the following deficits: Decreased knowledge of precautions, impaired UE functional use, pain, decreased ROM, postural dysfunction.   PT treatment/interventions: ADL/Self care home management, Therapeutic exercises, Therapeutic activity, Patient/Family education, Self Care, Orthotic/Fit training, DME instructions, Manual lymph drainage, Compression bandaging, scar  mobilization, and Manual therapy     GOALS: Goals reviewed with patient? Yes  LONG TERM GOALS:  (STG=LTG)  GOALS Name Target Date  Goal status  1 Pt will demonstrate she has regained full shoulder ROM and function post operatively compared to baselines.  Baseline: 02/19/2022 IN PROGRESS  2 Pt will report the discomfort from fullness in her chest and right axilla is decreased by 50% 02/19/2022 INITIAL  3 Pt will be independent in self care management of right axillary fullness with self MLD and compression  02/19/2022 INITIAL     PLAN: PT FREQUENCY/DURATION: 4 visits over 4 weeks to be coordinated with radiation appointment   PLAN FOR NEXT SESSION: reassess shoulder ROM, perform MLD to right axilla and chest , provide compression patch as needed    [facility]  [address], [address]  [place] [address]  [phone]  After Breast Cancer Class It is recommended you attend the ABC class to be educated on lymphedema risk reduction. This class is free of charge and lasts for 1 hour. It is a 1-time class. You will need to download the Webex app either on your phone or computer. We will send you a link the night before or the morning of the class. You should be able to click on that link  to join the class. This is not a confidential class. You don't have to turn your camera on, but other participants may be able to see your email address.  Scar massage You can begin gentle scar massage to you incision sites. Gently place one hand on the incision and move the skin (without sliding on the skin) in various directions. Do this for a few minutes and then you can gently massage either coconut oil or vitamin E cream into the scars.  Compression garment You should continue wearing your compression bra until you feel like you no longer have swelling.  Home exercise Program Continue doing the exercises you were given until you feel like you can do them without feeling  any tightness at the end.   Walking Program Studies show that 30 minutes of walking per day (fast enough to elevate your heart rate) can significantly reduce the risk of a cancer recurrence. If you can't walk due to other medical reasons, we encourage you to find another activity you could do (like a stationary bike or water exercise).  Posture After breast cancer surgery, people frequently sit with rounded shoulders posture because it puts their incisions on slack and feels better. If you sit like this and scar tissue forms in that position, you can become very tight and have pain sitting or standing with good posture. Try to be aware of your posture and sit and stand up tall to heal properly.  Follow up PT: It is recommended you return every 3 months for the first 3 years following surgery to be assessed on the SOZO machine for an L-Dex score. This helps prevent clinically significant lymphedema in 95% of patients. These follow up screens are 10 minute appointments that you are not billed for. Donato Heinz. Owens Shark PT  Norwood Levo, PT 01/22/2022, 10:56 AM

## 2022-01-23 ENCOUNTER — Other Ambulatory Visit: Payer: Self-pay

## 2022-01-23 ENCOUNTER — Ambulatory Visit
Admission: RE | Admit: 2022-01-23 | Discharge: 2022-01-23 | Disposition: A | Discharge status: Home / Self Care | Payer: Medicare PPO | Source: Ambulatory Visit | Attending: Radiation Oncology | Admitting: Radiation Oncology

## 2022-01-23 DIAGNOSIS — C50411 Malignant neoplasm of upper-outer quadrant of right female breast: Secondary | ICD-10-CM | POA: Diagnosis not present

## 2022-01-23 DIAGNOSIS — Z510 Encounter for antineoplastic radiation therapy: Secondary | ICD-10-CM | POA: Diagnosis not present

## 2022-01-23 DIAGNOSIS — Z170 Estrogen receptor positive status [ER+]: Secondary | ICD-10-CM | POA: Diagnosis not present

## 2022-01-23 LAB — RAD ONC ARIA SESSION SUMMARY
Course Elapsed Days: 1
Plan Fractions Treated to Date: 2
Plan Prescribed Dose Per Fraction: 2.66 Gy
Plan Total Fractions Prescribed: 16
Plan Total Prescribed Dose: 42.56 Gy
Reference Point Dosage Given to Date: 5.32 Gy
Reference Point Session Dosage Given: 2.66 Gy
Session Number: 2

## 2022-01-24 ENCOUNTER — Other Ambulatory Visit: Payer: Self-pay

## 2022-01-24 ENCOUNTER — Ambulatory Visit
Admission: RE | Admit: 2022-01-24 | Discharge: 2022-01-24 | Disposition: A | Discharge status: Home / Self Care | Payer: Medicare PPO | Source: Ambulatory Visit | Attending: Radiation Oncology | Admitting: Radiation Oncology

## 2022-01-24 DIAGNOSIS — C50411 Malignant neoplasm of upper-outer quadrant of right female breast: Secondary | ICD-10-CM | POA: Diagnosis not present

## 2022-01-24 DIAGNOSIS — Z510 Encounter for antineoplastic radiation therapy: Secondary | ICD-10-CM | POA: Diagnosis not present

## 2022-01-24 DIAGNOSIS — Z170 Estrogen receptor positive status [ER+]: Secondary | ICD-10-CM | POA: Diagnosis not present

## 2022-01-24 LAB — RAD ONC ARIA SESSION SUMMARY
Course Elapsed Days: 2
Plan Fractions Treated to Date: 3
Plan Prescribed Dose Per Fraction: 2.66 Gy
Plan Total Fractions Prescribed: 16
Plan Total Prescribed Dose: 42.56 Gy
Reference Point Dosage Given to Date: 7.98 Gy
Reference Point Session Dosage Given: 2.66 Gy
Session Number: 3

## 2022-01-27 ENCOUNTER — Telehealth: Payer: Self-pay | Admitting: Licensed Clinical Social Worker

## 2022-01-27 ENCOUNTER — Other Ambulatory Visit: Payer: Self-pay

## 2022-01-27 ENCOUNTER — Ambulatory Visit: Payer: Medicare PPO

## 2022-01-27 ENCOUNTER — Ambulatory Visit
Admission: RE | Admit: 2022-01-27 | Discharge: 2022-01-27 | Disposition: A | Discharge status: Home / Self Care | Payer: Medicare PPO | Source: Ambulatory Visit | Attending: Radiation Oncology | Admitting: Radiation Oncology

## 2022-01-27 DIAGNOSIS — Z510 Encounter for antineoplastic radiation therapy: Secondary | ICD-10-CM | POA: Diagnosis not present

## 2022-01-27 DIAGNOSIS — C50411 Malignant neoplasm of upper-outer quadrant of right female breast: Secondary | ICD-10-CM | POA: Diagnosis not present

## 2022-01-27 DIAGNOSIS — Z170 Estrogen receptor positive status [ER+]: Secondary | ICD-10-CM | POA: Diagnosis not present

## 2022-01-27 LAB — RAD ONC ARIA SESSION SUMMARY
Course Elapsed Days: 5
Plan Fractions Treated to Date: 4
Plan Prescribed Dose Per Fraction: 2.66 Gy
Plan Total Fractions Prescribed: 16
Plan Total Prescribed Dose: 42.56 Gy
Reference Point Dosage Given to Date: 10.64 Gy
Reference Point Session Dosage Given: 2.66 Gy
Session Number: 4

## 2022-01-27 MED ORDER — RADIAPLEXRX EX GEL: Freq: Once | CUTANEOUS | Status: AC

## 2022-01-27 MED ORDER — ALRA NON-METALLIC DEODORANT (RAD-ONC)
1.0000 | Freq: Once | TOPICAL | Status: AC
  Administered 2022-01-27: 1 via TOPICAL

## 2022-01-27 NOTE — Telephone Encounter (Signed)
[place] Work  Clinical Social Work was referred by nurse for assessment of psychosocial needs after pt was tearful during appointment this morning.  Clinical Social Worker contacted patient by phone  to offer support and assess for needs. CSW reintroduced self and support services. Pt declined to speak or schedule an appointment, stating that she is "fine". CSW did normalize having difficulty and ups and downs during treatment. Encouraged pt to reach out should she decide to pursue supportive services.      [place], [place] Worker [employer]

## 2022-01-27 NOTE — Progress Notes (Signed)

## 2022-01-28 ENCOUNTER — Ambulatory Visit
Admission: RE | Admit: 2022-01-28 | Discharge: 2022-01-28 | Disposition: A | Discharge status: Home / Self Care | Payer: Medicare PPO | Source: Ambulatory Visit | Attending: Radiation Oncology | Admitting: Radiation Oncology

## 2022-01-28 ENCOUNTER — Other Ambulatory Visit: Payer: Self-pay

## 2022-01-28 DIAGNOSIS — Z510 Encounter for antineoplastic radiation therapy: Secondary | ICD-10-CM | POA: Diagnosis not present

## 2022-01-28 DIAGNOSIS — C50411 Malignant neoplasm of upper-outer quadrant of right female breast: Secondary | ICD-10-CM | POA: Diagnosis not present

## 2022-01-28 DIAGNOSIS — Z170 Estrogen receptor positive status [ER+]: Secondary | ICD-10-CM | POA: Diagnosis not present

## 2022-01-28 LAB — RAD ONC ARIA SESSION SUMMARY
Course Elapsed Days: 6
Plan Fractions Treated to Date: 5
Plan Prescribed Dose Per Fraction: 2.66 Gy
Plan Total Fractions Prescribed: 16
Plan Total Prescribed Dose: 42.56 Gy
Reference Point Dosage Given to Date: 13.3 Gy
Reference Point Session Dosage Given: 2.66 Gy
Session Number: 5

## 2022-01-29 ENCOUNTER — Other Ambulatory Visit: Payer: Self-pay

## 2022-01-29 ENCOUNTER — Ambulatory Visit
Admission: RE | Admit: 2022-01-29 | Discharge: 2022-01-29 | Disposition: A | Discharge status: Home / Self Care | Payer: Medicare PPO | Source: Ambulatory Visit | Attending: Radiation Oncology | Admitting: Radiation Oncology

## 2022-01-29 DIAGNOSIS — C50411 Malignant neoplasm of upper-outer quadrant of right female breast: Secondary | ICD-10-CM | POA: Diagnosis not present

## 2022-01-29 DIAGNOSIS — Z170 Estrogen receptor positive status [ER+]: Secondary | ICD-10-CM | POA: Diagnosis not present

## 2022-01-29 DIAGNOSIS — Z510 Encounter for antineoplastic radiation therapy: Secondary | ICD-10-CM | POA: Diagnosis not present

## 2022-01-29 LAB — RAD ONC ARIA SESSION SUMMARY
Course Elapsed Days: 7
Plan Fractions Treated to Date: 6
Plan Prescribed Dose Per Fraction: 2.66 Gy
Plan Total Fractions Prescribed: 16
Plan Total Prescribed Dose: 42.56 Gy
Reference Point Dosage Given to Date: 15.96 Gy
Reference Point Session Dosage Given: 2.66 Gy
Session Number: 6

## 2022-01-30 ENCOUNTER — Other Ambulatory Visit: Payer: Self-pay

## 2022-01-30 ENCOUNTER — Ambulatory Visit
Admission: RE | Admit: 2022-01-30 | Discharge: 2022-01-30 | Disposition: A | Discharge status: Home / Self Care | Payer: Medicare PPO | Source: Ambulatory Visit | Attending: Radiation Oncology | Admitting: Radiation Oncology

## 2022-01-30 DIAGNOSIS — Z510 Encounter for antineoplastic radiation therapy: Secondary | ICD-10-CM | POA: Diagnosis not present

## 2022-01-30 DIAGNOSIS — C50411 Malignant neoplasm of upper-outer quadrant of right female breast: Secondary | ICD-10-CM | POA: Diagnosis not present

## 2022-01-30 DIAGNOSIS — Z170 Estrogen receptor positive status [ER+]: Secondary | ICD-10-CM | POA: Diagnosis not present

## 2022-01-30 LAB — RAD ONC ARIA SESSION SUMMARY
Course Elapsed Days: 8
Plan Fractions Treated to Date: 7
Plan Prescribed Dose Per Fraction: 2.66 Gy
Plan Total Fractions Prescribed: 16
Plan Total Prescribed Dose: 42.56 Gy
Reference Point Dosage Given to Date: 18.62 Gy
Reference Point Session Dosage Given: 2.66 Gy
Session Number: 7

## 2022-01-31 ENCOUNTER — Ambulatory Visit
Admission: RE | Admit: 2022-01-31 | Discharge: 2022-01-31 | Disposition: A | Discharge status: Home / Self Care | Payer: Medicare PPO | Source: Ambulatory Visit | Attending: Radiation Oncology | Admitting: Radiation Oncology

## 2022-01-31 ENCOUNTER — Other Ambulatory Visit: Payer: Self-pay

## 2022-01-31 DIAGNOSIS — Z170 Estrogen receptor positive status [ER+]: Secondary | ICD-10-CM | POA: Diagnosis not present

## 2022-01-31 DIAGNOSIS — C50411 Malignant neoplasm of upper-outer quadrant of right female breast: Secondary | ICD-10-CM | POA: Diagnosis not present

## 2022-01-31 DIAGNOSIS — Z510 Encounter for antineoplastic radiation therapy: Secondary | ICD-10-CM | POA: Diagnosis not present

## 2022-01-31 LAB — RAD ONC ARIA SESSION SUMMARY
Course Elapsed Days: 9
Plan Fractions Treated to Date: 8
Plan Prescribed Dose Per Fraction: 2.66 Gy
Plan Total Fractions Prescribed: 16
Plan Total Prescribed Dose: 42.56 Gy
Reference Point Dosage Given to Date: 21.28 Gy
Reference Point Session Dosage Given: 2.66 Gy
Session Number: 8

## 2022-02-03 ENCOUNTER — Ambulatory Visit
Admission: RE | Admit: 2022-02-03 | Discharge: 2022-02-03 | Disposition: A | Discharge status: Home / Self Care | Payer: Medicare PPO | Source: Ambulatory Visit | Attending: Radiation Oncology | Admitting: Radiation Oncology

## 2022-02-03 ENCOUNTER — Other Ambulatory Visit: Payer: Self-pay

## 2022-02-03 DIAGNOSIS — C50411 Malignant neoplasm of upper-outer quadrant of right female breast: Secondary | ICD-10-CM | POA: Diagnosis not present

## 2022-02-03 DIAGNOSIS — Z510 Encounter for antineoplastic radiation therapy: Secondary | ICD-10-CM | POA: Diagnosis not present

## 2022-02-03 DIAGNOSIS — Z170 Estrogen receptor positive status [ER+]: Secondary | ICD-10-CM | POA: Diagnosis not present

## 2022-02-03 LAB — RAD ONC ARIA SESSION SUMMARY
Course Elapsed Days: 12
Plan Fractions Treated to Date: 9
Plan Prescribed Dose Per Fraction: 2.66 Gy
Plan Total Fractions Prescribed: 16
Plan Total Prescribed Dose: 42.56 Gy
Reference Point Dosage Given to Date: 23.94 Gy
Reference Point Session Dosage Given: 2.66 Gy
Session Number: 9

## 2022-02-03 NOTE — Progress Notes (Signed)
Patient Care Team: Asencion Noble, MD as PCP - General (Internal Medicine) Mauro Kaufmann, RN as Oncology Nurse Navigator Rockwell Germany, RN as Oncology Nurse Navigator Nicholas Lose, MD as Consulting Physician (Hematology and Oncology)  DIAGNOSIS:  Encounter Diagnosis  Name Primary?   Malignant neoplasm of upper-outer quadrant of right breast in female, estrogen receptor positive ([place]) Yes    SUMMARY OF ONCOLOGIC HISTORY: Oncology History  Malignant neoplasm of upper-outer quadrant of right breast in female, estrogen receptor positive ([place])  10/23/2021 Initial Diagnosis   Screening mammogram detected abnormalities in the right breast: 12 o'clock position 0.7 cm: Biopsy: Grade 1 IDC with ALH ER 90%, PR 95%, Ki-67 less than 1%, HER2 1+, the other 2 biopsies were benign (fibrocystic change and focal ADH and ALH)   11/12/2021 Cancer Staging   Staging form: Breast, AJCC 8th Edition - Clinical: Stage IA (cT1b, cN0, cM0, G1, ER+, PR+, HER2-) - Signed by Nicholas Lose, MD on 11/12/2021 Stage prefix: Initial diagnosis Histologic grading system: 3 grade system   12/17/2021 Surgery   Right lumpectomy: Grade 1 IDC 10 mm, margins negative, 0/1 lymph node negative, ER 90%, PR 95%, HER2 negative 1+, Ki-67 less than 1% Right lumpectomy: Invasive cribriform carcinoma, grade 1, 2 mm, DCIS grade 2 8 mm, margins negative, ER 100%, PR 100%, Ki-67 5%, HER2 1+ negative     CHIEF COMPLIANT: Follow-up right breast cancer and start Letrozole  INTERVAL HISTORY: Christy Flores is a 71 y.o with right breast cancer. She presents to the clinic for a follow-up. She had some concerns about what's the best time to take the letrozole. She does have osteoporosis. She takes Prolia and calcium for it. She has fracture her left foot twice. She complains of a rash on her chest. She has been applying Cortizone but it is not working and its itching and aggravating. She wanted to know if she could take benadryl.  ALLERGIES:   is allergic to codeine.  MEDICATIONS:  Current Outpatient Medications  Medication Sig Dispense Refill   acetaminophen (TYLENOL) 500 MG tablet Take 500 mg by mouth every 6 (six) hours as needed for moderate pain.     BIOTIN PO Take 1 tablet by mouth daily.     CALCIUM PO Take 1 tablet by mouth daily.     citalopram (CELEXA) 20 MG tablet Take 20 mg by mouth daily.     Cyanocobalamin (B-12 PO) Take 1 tablet by mouth daily as needed (energy).     cycloSPORINE (RESTASIS) 0.05 % ophthalmic emulsion Place 1 drop into both eyes 2 (two) times daily as needed (dry eyes).     denosumab (PROLIA) 60 MG/ML SOSY injection Inject 60 mg into the skin every 6 (six) months.     Multiple Vitamins-Minerals (MULTIVITAMIN WITH MINERALS) tablet Take 1 tablet by mouth daily.     naproxen sodium (ALEVE) 220 MG tablet Take 220 mg by mouth daily as needed (pain).     Polyvinyl Alcohol-Povidone (REFRESH OP) Place 1 drop into both eyes daily as needed (dry eyes).     traMADol (ULTRAM) 50 MG tablet Take 2 tablets (100 mg total) by mouth every 6 (six) hours as needed. 10 tablet 0   TURMERIC PO Take 1 capsule by mouth daily.     VITAMIN D PO Take 1 tablet by mouth daily.     No current facility-administered medications for this visit.    PHYSICAL EXAMINATION: ECOG PERFORMANCE STATUS: 1 - Symptomatic but completely ambulatory  Vitals:  02/12/22 0900  BP: 131/75  Pulse: 68  Resp: 18  Temp: (!) 97.5 F (36.4 C)  SpO2: 94%   Filed Weights   02/12/22 0900  Weight: 185 lb 3.2 oz (84 kg)    BREAST: Radiation dermatitis in the right breast (exam performed in the presence of a chaperone)  LABORATORY DATA:  I have reviewed the data as listed    Latest Ref Rng & Units 06/25/2020   12:40 PM 05/23/2008   11:45 AM  CMP  Glucose 70 - 99 mg/dL 98  97   BUN 8 - 23 mg/dL 14  11   Creatinine 0.44 - 1.00 mg/dL 0.70  0.67   Sodium 135 - 145 mmol/L 139  138   Potassium 3.5 - 5.1 mmol/L 4.3  4.2   Chloride 98 - 111  mmol/L 103  102   CO2   29   Calcium   9.2     Lab Results  Component Value Date   WBC 5.1 12/11/2021   HGB 13.5 12/11/2021   HCT 41.3 12/11/2021   MCV 99.3 12/11/2021   PLT 214 12/11/2021    ASSESSMENT & PLAN:  Malignant neoplasm of upper-outer quadrant of right breast in female, estrogen receptor positive ([place]) 12/17/2021:Right lumpectomy: Grade 1 IDC 10 mm, margins negative, 0/1 lymph node negative, ER 90%, PR 95%, HER2 negative 1+, Ki-67 less than 1% Right lumpectomy: Invasive cribriform carcinoma, grade 1, 2 mm, DCIS grade 2 8 mm, margins negative, ER 100%, PR 100%, Ki-67 5%, HER2 1+ negative Adjuvant radiation: 01/23/2022-02/12/2022 ------------------------------------------------------------------------------------------------------------------------------------------------------ Current treatment: Plan to start antiestrogen therapy with letrozole 2.5 mg daily  Letrozole counseling: We discussed the risks and benefits of anti-estrogen therapy with aromatase inhibitors. These include but not limited to insomnia, hot flashes, mood changes, vaginal dryness, bone density loss, and weight gain. We strongly believe that the benefits far outweigh the risks. Patient understands these risks and consented to starting treatment. Planned treatment duration is 5-7 years.  Osteoporosis: February 2020 her bone density showed a T-score of -2.5.  She also had a couple of fractures in her feet.  She is currently on Prolia every 6 months.  We would like to obtain a bone density test in the next month at [facility].  Itching related to radiation: She will apply topical ointments and take Benadryl as needed.  Return to clinic in 3 months for survivorship care plan visit    Orders Placed This Encounter  Procedures   DG Bone Density    Standing Status:   Future    Standing Expiration Date:   03/15/2022    Order Specific Question:   Reason for Exam (SYMPTOM  OR DIAGNOSIS REQUIRED)    Answer:    [email or web address]    Order Specific Question:   Preferred imaging location?    Answer:   [facility]   The patient has a good understanding of the overall plan. she agrees with it. she will call with any problems that may develop before the next visit here. Total time spent: 30 mins including face to face time and time spent for planning, charting and co-ordination of care   Harriette Ohara, MD 02/12/22    I Gardiner Coins am scribing for Dr. Lindi Adie  I have reviewed the above documentation for accuracy and completeness, and I agree with the above.

## 2022-02-04 ENCOUNTER — Ambulatory Visit
Admission: RE | Admit: 2022-02-04 | Discharge: 2022-02-04 | Disposition: A | Discharge status: Home / Self Care | Payer: Medicare PPO | Source: Ambulatory Visit | Attending: Radiation Oncology | Admitting: Radiation Oncology

## 2022-02-04 ENCOUNTER — Other Ambulatory Visit: Payer: Self-pay

## 2022-02-04 DIAGNOSIS — Z170 Estrogen receptor positive status [ER+]: Secondary | ICD-10-CM | POA: Diagnosis not present

## 2022-02-04 DIAGNOSIS — C50411 Malignant neoplasm of upper-outer quadrant of right female breast: Secondary | ICD-10-CM | POA: Insufficient documentation

## 2022-02-04 DIAGNOSIS — Z510 Encounter for antineoplastic radiation therapy: Secondary | ICD-10-CM | POA: Diagnosis not present

## 2022-02-04 LAB — RAD ONC ARIA SESSION SUMMARY
Course Elapsed Days: 13
Plan Fractions Treated to Date: 10
Plan Prescribed Dose Per Fraction: 2.66 Gy
Plan Total Fractions Prescribed: 16
Plan Total Prescribed Dose: 42.56 Gy
Reference Point Dosage Given to Date: 26.6 Gy
Reference Point Session Dosage Given: 2.66 Gy
Session Number: 10

## 2022-02-05 ENCOUNTER — Encounter: Payer: Self-pay | Admitting: Physical Therapy

## 2022-02-05 ENCOUNTER — Other Ambulatory Visit: Payer: Self-pay

## 2022-02-05 ENCOUNTER — Ambulatory Visit: Payer: Medicare PPO | Attending: General Surgery | Admitting: Physical Therapy

## 2022-02-05 ENCOUNTER — Ambulatory Visit
Admission: RE | Admit: 2022-02-05 | Discharge: 2022-02-05 | Disposition: A | Discharge status: Home / Self Care | Payer: Medicare PPO | Source: Ambulatory Visit | Attending: Radiation Oncology | Admitting: Radiation Oncology

## 2022-02-05 DIAGNOSIS — C50111 Malignant neoplasm of central portion of right female breast: Secondary | ICD-10-CM | POA: Insufficient documentation

## 2022-02-05 DIAGNOSIS — R293 Abnormal posture: Secondary | ICD-10-CM | POA: Diagnosis not present

## 2022-02-05 DIAGNOSIS — I890 Lymphedema, not elsewhere classified: Secondary | ICD-10-CM | POA: Insufficient documentation

## 2022-02-05 DIAGNOSIS — Z170 Estrogen receptor positive status [ER+]: Secondary | ICD-10-CM | POA: Diagnosis not present

## 2022-02-05 DIAGNOSIS — R222 Localized swelling, mass and lump, trunk: Secondary | ICD-10-CM | POA: Insufficient documentation

## 2022-02-05 DIAGNOSIS — Z510 Encounter for antineoplastic radiation therapy: Secondary | ICD-10-CM | POA: Diagnosis not present

## 2022-02-05 DIAGNOSIS — C50411 Malignant neoplasm of upper-outer quadrant of right female breast: Secondary | ICD-10-CM | POA: Diagnosis not present

## 2022-02-05 DIAGNOSIS — Z483 Aftercare following surgery for neoplasm: Secondary | ICD-10-CM | POA: Insufficient documentation

## 2022-02-05 LAB — RAD ONC ARIA SESSION SUMMARY
Course Elapsed Days: 14
Plan Fractions Treated to Date: 11
Plan Prescribed Dose Per Fraction: 2.66 Gy
Plan Total Fractions Prescribed: 16
Plan Total Prescribed Dose: 42.56 Gy
Reference Point Dosage Given to Date: 29.26 Gy
Reference Point Session Dosage Given: 2.66 Gy
Session Number: 11

## 2022-02-05 NOTE — Therapy (Signed)
OUTPATIENT PHYSICAL THERAPY BREAST CANCER POST OP FOLLOW UP   Patient Name: Christy Flores MRN: [id number] DOB:12-Jan-1951, 71 y.o., female Today's Date: 02/05/2022   PT End of Session - 02/05/22 0927     Visit Number 3    Number of Visits 6    Date for PT Re-Evaluation 02/07/22    PT Start Time 0853    PT Stop Time 0920    PT Time Calculation (min) 27 min             Past Medical History:  Diagnosis Date   GERD (gastroesophageal reflux disease)    Senile osteoporosis    Sleep apnea    on CPAP   Past Surgical History:  Procedure Laterality Date   ABDOMINAL HYSTERECTOMY     AXILLARY LYMPH NODE BIOPSY Right 12/17/2021   Procedure: RIGHT AXILLARY SENTINEL LYMPH NODE BIOPSY;  Surgeon: Rolm Bookbinder, MD;  Location: [facility];  Service: General;  Laterality: Right;   BACK SURGERY     BREAST BIOPSY Right 11/27/2021   BREAST BIOPSY Right 11/06/2021   times 2   BREAST LUMPECTOMY WITH RADIOACTIVE SEED LOCALIZATION Right 12/17/2021   Procedure: RIGHT BREAST LUMPECTOMY WITH RADIOACTIVE SEED LOCALIZATION;  Surgeon: Rolm Bookbinder, MD;  Location: [facility];  Service: General;  Laterality: Right;  [place]   lasik   RADIOACTIVE SEED GUIDED EXCISIONAL BREAST BIOPSY Right 12/17/2021   Procedure: RIGHT BREAST SEED BRACKETED EXCISIONAL BIOPSY;  Surgeon: Rolm Bookbinder, MD;  Location: [facility];  Service: General;  Laterality: Right;   RHINOPLASTY     TUBAL LIGATION     Patient Active Problem List   Diagnosis Date Noted   Osteoporosis with pathological fracture 12/20/2021   Malignant neoplasm of upper-outer quadrant of right breast in female, estrogen receptor positive ([place]) 11/12/2021    PCP: Asencion Noble, MD  REFERRING PROVIDER: Rolm Bookbinder, MD  REFERRING DIAG:  Right Breast Cancer  THERAPY DIAG:  Malignant neoplasm of central portion of right breast in female,  estrogen receptor positive ([place])  Abnormal posture  Aftercare following surgery for neoplasm  Localized swelling, mass and lump, trunk  Rationale for Evaluation and Treatment Rehabilitation  ONSET DATE: 11/08/2022  SUBJECTIVE:                                                                                                                                                                                           SUBJECTIVE STATEMENT:  Pt states she feels so much better!  She has been doing the self MLD and  feels that a lot of the swelling is gone. She knows she still has a little in her armpit and behind her arm but she can get her husband to help her with that too.  She will be finished with radiation at the end of next week. She thinks she will be ok and does not need more skilled PT at this point, but will call if she develops some problems.  PERTINENT HISTORY:  Patient was diagnosed on 11/07/2021 with right Invasive Ductal Carcinoma grade 1. It measures .7  and . 3  cm and is located in the central quadrant. It is ER+, PR=+,Her 2- with a  low Ki67 .She has a sister with breast cancer in her 14s who she thinks may have some unknown genetic mutation. She has a maternal grandmother and 5 maternal aunts with breast cancer. She thinks her mother had ovarian cancer. She has a brother and sister with colon cancer. She has a brother and her father both having melanoma.  She had a right lumpectomy x 2 on 12/17/2021 with 0/1 lymph nodes removed.  Pt had difficulty with skin rash post op. She has radiation til 02/12/2022   PATIENT GOALS:  Reassess how my recovery is going related to arm function, pain, and swelling.  PAIN:  Are you having pain?  No  PRECAUTIONS: Recent Surgery, right UE Lymphedema risk current radiation   ACTIVITY LEVEL / LEISURE: pt has been doing her post op shoulder exercises  She has not been able to walk because it has been so hot but wants to get back to it as she normally is a  "walker" 3- 5 miles a day    OBJECTIVE:    OBSERVATIONS:  Pt  has healed incisions on right breast and axilla with only slight darkening of skin from radiation.  The swelling in her left lateral chest is much reduced with fullness around axillary incision only visible when she is in left sidelying position with arm overhead.  POSTURE: foreard head, forward shoulders, decreased thoracic kyphosis   UPPER EXTREMITY AROM/PROM:   A/PROM RIGHT  11/12/2021   RIGHT 02/05/2022   Shoulder extension 65   Shoulder flexion 148 165  Shoulder abduction 170 175  Shoulder internal rotation 70   Shoulder external rotation 100                           (Blank rows = not tested)   A/PROM LEFT  11/12/2021  Shoulder extension 57  Shoulder flexion 150  Shoulder abduction 165  Shoulder internal rotation 70  Shoulder external rotation 85                          (Blank rows = not tested)     TREATMENT TODAY: 02/05/2022:  Remeasured right shoulder ROM.  Pt has achieved full return of motion Reviewed MLD technique emphasizing that pt avoid radiation field . Used mirror to show that she could do skin stretch on abdomen and still achieve skin movement at axilla without direct contact on radiated skin.  Performed MLD to left  axillary nodes, anterior interaxillary anastamosis, right inguinal nodes with right axillo-inguinal anastamosis. Right lateral trunk and posterior interaxillary anastmosis verbally reviewing how she could ask her husband to do this across her back. Pt still has fullness in axilla, but it is much less. She is wearing her Prarie compression bra which she says has helped a lot. Gave  her a foam patch to wear at her axilla in the future ( after radiation is complete) if the fullness in her axilla persists.  Will discharge and send note to Dr. Donne Hazel      01/22/2022  instructed in supine dowel exercises to improve shoulder ROM. Briefly, performed MLD to right chest and axilla: short neck, left  axillary nodes and interaxillay anastamosis, right inguinal nodes and left axillo-inguinal anastamosis, right axilla. Also instructed in self MLD technique and to start wearing her compression bra up into axilla with tg soft given to pad the top of bra which was irritating to her     PATIENT EDUCATION:  Education details: reviewed MLD  Person educated: Patient Education method: Explanation   Education comprehension: verbalized understanding and returned demonstration    ASSESSMENT:  CLINICAL IMPRESSION: Pt is much improved since last visit.  She will complete radiation next week and should continue her recovery. She does not need skilled PT follow up at this time, but will be have 3 months SOZO screening so she can ask for more PT should she need it at that time.  Pt will benefit from skilled therapeutic intervention to improve on the following deficits: Decreased knowledge of precautions, impaired UE functional use, pain, decreased ROM, postural dysfunction.   PT treatment/interventions: ADL/Self care home management, Therapeutic exercises, Therapeutic activity, Patient/Family education, Self Care, Orthotic/Fit training, DME instructions, Manual lymph drainage, Compression bandaging, scar mobilization, and Manual therapy     GOALS: Goals reviewed with patient? Yes  LONG TERM GOALS:  (STG=LTG)  GOALS Name Target Date  Goal status  1 Pt will demonstrate she has regained full shoulder ROM and function post operatively compared to baselines.  Baseline: 03/05/2022 MET  2 Pt will report the discomfort from fullness in her chest and right axilla is decreased by 50% 03/05/2022 MET  3 Pt will be independent in self care management of right axillary fullness with self MLD and compression  03/05/2022 MET     PLAN: PT FREQUENCY/DURATION: 4 visits over 4 weeks to be coordinated with radiation appointment   PLAN FOR NEXT SESSION: Continue SOZO screening.  Check to see if pt has signed up for  ABC class.  Ask if swelling in right axilla is reducing. Ask for another PT order if needed to address that.    PHYSICAL THERAPY DISCHARGE SUMMARY  Visits from Start of Care:   Current functional level related to goals / functional outcomes: Independent    Remaining deficits: Fluid in right axilla resolving, radiation ongoing    Education / Equipment: Self MLD and home exercise   Patient agrees to discharge. Patient goals were met. Patient is being discharged due to meeting the stated rehab goals.    Donato Heinz. Owens Shark, PT  Norwood Levo, PT 02/05/2022, 9:30 AM

## 2022-02-06 ENCOUNTER — Ambulatory Visit
Admission: RE | Admit: 2022-02-06 | Discharge: 2022-02-06 | Disposition: A | Discharge status: Home / Self Care | Payer: Medicare PPO | Source: Ambulatory Visit | Attending: Radiation Oncology | Admitting: Radiation Oncology

## 2022-02-06 ENCOUNTER — Other Ambulatory Visit: Payer: Self-pay

## 2022-02-06 DIAGNOSIS — C50411 Malignant neoplasm of upper-outer quadrant of right female breast: Secondary | ICD-10-CM | POA: Diagnosis not present

## 2022-02-06 DIAGNOSIS — Z170 Estrogen receptor positive status [ER+]: Secondary | ICD-10-CM | POA: Diagnosis not present

## 2022-02-06 DIAGNOSIS — Z510 Encounter for antineoplastic radiation therapy: Secondary | ICD-10-CM | POA: Diagnosis not present

## 2022-02-06 LAB — RAD ONC ARIA SESSION SUMMARY
Course Elapsed Days: 15
Plan Fractions Treated to Date: 12
Plan Prescribed Dose Per Fraction: 2.66 Gy
Plan Total Fractions Prescribed: 16
Plan Total Prescribed Dose: 42.56 Gy
Reference Point Dosage Given to Date: 31.92 Gy
Reference Point Session Dosage Given: 2.66 Gy
Session Number: 12

## 2022-02-07 ENCOUNTER — Ambulatory Visit
Admission: RE | Admit: 2022-02-07 | Discharge: 2022-02-07 | Disposition: A | Discharge status: Home / Self Care | Payer: Medicare PPO | Source: Ambulatory Visit | Attending: Radiation Oncology | Admitting: Radiation Oncology

## 2022-02-07 ENCOUNTER — Other Ambulatory Visit: Payer: Self-pay

## 2022-02-07 ENCOUNTER — Ambulatory Visit: Payer: Medicare PPO | Admitting: Physical Therapy

## 2022-02-07 DIAGNOSIS — Z510 Encounter for antineoplastic radiation therapy: Secondary | ICD-10-CM | POA: Diagnosis not present

## 2022-02-07 DIAGNOSIS — C50411 Malignant neoplasm of upper-outer quadrant of right female breast: Secondary | ICD-10-CM | POA: Diagnosis not present

## 2022-02-07 DIAGNOSIS — Z170 Estrogen receptor positive status [ER+]: Secondary | ICD-10-CM

## 2022-02-07 LAB — RAD ONC ARIA SESSION SUMMARY
Course Elapsed Days: 16
Plan Fractions Treated to Date: 13
Plan Prescribed Dose Per Fraction: 2.66 Gy
Plan Total Fractions Prescribed: 16
Plan Total Prescribed Dose: 42.56 Gy
Reference Point Dosage Given to Date: 34.58 Gy
Reference Point Session Dosage Given: 2.66 Gy
Session Number: 13

## 2022-02-07 MED ORDER — RADIAPLEXRX EX GEL: Freq: Once | CUTANEOUS | Status: AC

## 2022-02-10 ENCOUNTER — Other Ambulatory Visit: Payer: Self-pay

## 2022-02-10 ENCOUNTER — Encounter: Payer: Self-pay | Admitting: *Deleted

## 2022-02-10 ENCOUNTER — Ambulatory Visit
Admission: RE | Admit: 2022-02-10 | Discharge: 2022-02-10 | Disposition: A | Discharge status: Home / Self Care | Payer: Medicare PPO | Source: Ambulatory Visit | Attending: Radiation Oncology | Admitting: Radiation Oncology

## 2022-02-10 DIAGNOSIS — Z170 Estrogen receptor positive status [ER+]: Secondary | ICD-10-CM

## 2022-02-10 DIAGNOSIS — Z510 Encounter for antineoplastic radiation therapy: Secondary | ICD-10-CM | POA: Diagnosis not present

## 2022-02-10 DIAGNOSIS — C50411 Malignant neoplasm of upper-outer quadrant of right female breast: Secondary | ICD-10-CM | POA: Diagnosis not present

## 2022-02-10 LAB — RAD ONC ARIA SESSION SUMMARY
Course Elapsed Days: 19
Plan Fractions Treated to Date: 14
Plan Prescribed Dose Per Fraction: 2.66 Gy
Plan Total Fractions Prescribed: 16
Plan Total Prescribed Dose: 42.56 Gy
Reference Point Dosage Given to Date: 37.24 Gy
Reference Point Session Dosage Given: 2.66 Gy
Session Number: 14

## 2022-02-11 ENCOUNTER — Ambulatory Visit
Admission: RE | Admit: 2022-02-11 | Discharge: 2022-02-11 | Disposition: A | Discharge status: Home / Self Care | Payer: Medicare PPO | Source: Ambulatory Visit | Attending: Radiation Oncology | Admitting: Radiation Oncology

## 2022-02-11 ENCOUNTER — Other Ambulatory Visit: Payer: Self-pay

## 2022-02-11 DIAGNOSIS — Z510 Encounter for antineoplastic radiation therapy: Secondary | ICD-10-CM | POA: Diagnosis not present

## 2022-02-11 DIAGNOSIS — C50411 Malignant neoplasm of upper-outer quadrant of right female breast: Secondary | ICD-10-CM | POA: Diagnosis not present

## 2022-02-11 DIAGNOSIS — Z170 Estrogen receptor positive status [ER+]: Secondary | ICD-10-CM | POA: Diagnosis not present

## 2022-02-11 LAB — RAD ONC ARIA SESSION SUMMARY
Course Elapsed Days: 20
Plan Fractions Treated to Date: 15
Plan Prescribed Dose Per Fraction: 2.66 Gy
Plan Total Fractions Prescribed: 16
Plan Total Prescribed Dose: 42.56 Gy
Reference Point Dosage Given to Date: 39.9 Gy
Reference Point Session Dosage Given: 2.66 Gy
Session Number: 15

## 2022-02-11 NOTE — Assessment & Plan Note (Signed)
12/17/2021:Right lumpectomy: Grade 1 IDC 10 mm, margins negative, 0/1 lymph node negative, ER 90%, PR 95%, HER2 negative 1+, Ki-67 less than 1% Right lumpectomy: Invasive cribriform carcinoma, grade 1, 2 mm, DCIS grade 2 8 mm, margins negative, ER 100%, PR 100%, Ki-67 5%, HER2 1+ negative Adjuvant radiation: 01/23/2022-02/12/2022 ------------------------------------------------------------------------------------------------------------------------------------------------------ Current treatment:Plan to start antiestrogen therapy with letrozole 2.5 mg daily  Letrozole counseling: We discussed the risks and benefits of anti-estrogen therapy with aromatase inhibitors. These include but not limited to insomnia, hot flashes, mood changes, vaginal dryness, bone density loss, and weight gain. We strongly believe that the benefits far outweigh the risks. Patient understands these risks and consented to starting treatment. Planned treatment duration is 5-7 years.  Return to clinic in 3 months for survivorship care plan visit

## 2022-02-12 ENCOUNTER — Encounter: Payer: Self-pay | Admitting: Radiation Oncology

## 2022-02-12 ENCOUNTER — Other Ambulatory Visit: Payer: Self-pay

## 2022-02-12 ENCOUNTER — Ambulatory Visit
Admission: RE | Admit: 2022-02-12 | Discharge: 2022-02-12 | Disposition: A | Discharge status: Home / Self Care | Payer: Medicare PPO | Source: Ambulatory Visit | Attending: Radiation Oncology | Admitting: Radiation Oncology

## 2022-02-12 ENCOUNTER — Inpatient Hospital Stay: Payer: Medicare PPO | Attending: Hematology and Oncology | Admitting: Hematology and Oncology

## 2022-02-12 VITALS — BP 131/75 | HR 68 | Temp 97.5°F | Resp 18 | Ht 64.0 in | Wt 185.2 lb

## 2022-02-12 DIAGNOSIS — Z170 Estrogen receptor positive status [ER+]: Secondary | ICD-10-CM | POA: Diagnosis not present

## 2022-02-12 DIAGNOSIS — M810 Age-related osteoporosis without current pathological fracture: Secondary | ICD-10-CM | POA: Diagnosis not present

## 2022-02-12 DIAGNOSIS — C50411 Malignant neoplasm of upper-outer quadrant of right female breast: Secondary | ICD-10-CM

## 2022-02-12 DIAGNOSIS — Z510 Encounter for antineoplastic radiation therapy: Secondary | ICD-10-CM | POA: Diagnosis not present

## 2022-02-12 LAB — RAD ONC ARIA SESSION SUMMARY
Course Elapsed Days: 21
Plan Fractions Treated to Date: 16
Plan Prescribed Dose Per Fraction: 2.66 Gy
Plan Total Fractions Prescribed: 16
Plan Total Prescribed Dose: 42.56 Gy
Reference Point Dosage Given to Date: 42.56 Gy
Reference Point Session Dosage Given: 2.66 Gy
Session Number: 16

## 2022-02-13 ENCOUNTER — Telehealth: Payer: Self-pay | Admitting: Hematology and Oncology

## 2022-02-13 ENCOUNTER — Other Ambulatory Visit: Payer: Self-pay | Admitting: *Deleted

## 2022-02-13 MED ORDER — LETROZOLE 2.5 MG PO TABS: 2.5000 mg | ORAL_TABLET | Freq: Every day | ORAL | 0 refills | Status: DC

## 2022-02-13 NOTE — Telephone Encounter (Signed)
Contacted patient to scheduled appointments. Patient is aware of appointments that are scheduled.   

## 2022-02-25 ENCOUNTER — Other Ambulatory Visit: Payer: Self-pay

## 2022-02-25 DIAGNOSIS — C50411 Malignant neoplasm of upper-outer quadrant of right female breast: Secondary | ICD-10-CM

## 2022-02-25 DIAGNOSIS — M8000XA Age-related osteoporosis with current pathological fracture, unspecified site, initial encounter for fracture: Secondary | ICD-10-CM

## 2022-02-25 NOTE — Progress Notes (Signed)
Order for Bone Density entered per MD; expected date 03/15/22. Pt will f/u in [facility] with Wilber Bihari, NP. Pt was provided number for scheduling.

## 2022-02-26 ENCOUNTER — Encounter: Payer: Self-pay | Admitting: Internal Medicine

## 2022-02-26 NOTE — Progress Notes (Signed)
                                                                                                                                                             Patient Name: Christy Flores MRN: [id number] DOB: August 03, 1950 Referring Physician: Nicholas Lose (Profile Not Attached) Date of Service: 02/12/2022 [facility], [place]                                                        End Of Treatment Note  Diagnoses: C50.411-Malignant neoplasm of upper-outer quadrant of right female breast  Cancer Staging:  Cancer Staging  Malignant neoplasm of upper-outer quadrant of right breast in female, estrogen receptor positive ([place]) Staging form: Breast, AJCC 8th Edition - Clinical: Stage IA (cT1b, cN0, cM0, G1, ER+, PR+, HER2-) - Signed by Nicholas Lose, MD on 11/12/2021 Stage prefix: Initial diagnosis Histologic grading system: 3 grade system  Intent: Curative  Radiation Treatment Dates: 01/22/2022 through 02/12/2022 Site Technique Total Dose (Gy) Dose per Fx (Gy) Completed Fx Beam Energies  Breast, Right: Breast_R 3D 42.56/42.56 2.66 16/16 10X   Narrative: The patient tolerated radiation therapy relatively well.   Plan: The patient will follow-up with radiation oncology in 76mo -----------------------------------  SEppie Gibson MD

## 2022-02-28 ENCOUNTER — Ambulatory Visit: Payer: Medicare PPO

## 2022-02-28 DIAGNOSIS — C50111 Malignant neoplasm of central portion of right female breast: Secondary | ICD-10-CM | POA: Diagnosis not present

## 2022-02-28 DIAGNOSIS — I890 Lymphedema, not elsewhere classified: Secondary | ICD-10-CM

## 2022-02-28 DIAGNOSIS — Z483 Aftercare following surgery for neoplasm: Secondary | ICD-10-CM

## 2022-02-28 DIAGNOSIS — R222 Localized swelling, mass and lump, trunk: Secondary | ICD-10-CM

## 2022-02-28 DIAGNOSIS — Z170 Estrogen receptor positive status [ER+]: Secondary | ICD-10-CM | POA: Diagnosis not present

## 2022-02-28 DIAGNOSIS — R293 Abnormal posture: Secondary | ICD-10-CM | POA: Diagnosis not present

## 2022-02-28 NOTE — Therapy (Signed)
OUTPATIENT PHYSICAL THERAPY BREAST CANCER RE-EVAL   Patient Name: Christy Flores MRN: [id number] DOB:11-13-1950, 71 y.o., female Today's Date: 02/28/2022   PT End of Session - 02/28/22 1002     Visit Number 4    Number of Visits 16    Date for PT Re-Evaluation 04/11/22    Authorization Type [employer]    PT Start Time 1005    PT Stop Time 1100    PT Time Calculation (min) 55 min    Activity Tolerance Patient tolerated treatment well    Behavior During Therapy WFL for tasks assessed/performed             Past Medical History:  Diagnosis Date   GERD (gastroesophageal reflux disease)    Senile osteoporosis    Sleep apnea    on CPAP   Past Surgical History:  Procedure Laterality Date   ABDOMINAL HYSTERECTOMY     AXILLARY LYMPH NODE BIOPSY Right 12/17/2021   Procedure: RIGHT AXILLARY SENTINEL LYMPH NODE BIOPSY;  Surgeon: Rolm Bookbinder, MD;  Location: [place];  Service: General;  Laterality: Right;   BACK SURGERY     BREAST BIOPSY Right 11/27/2021   BREAST BIOPSY Right 11/06/2021   times 2   BREAST LUMPECTOMY WITH RADIOACTIVE SEED LOCALIZATION Right 12/17/2021   Procedure: RIGHT BREAST LUMPECTOMY WITH RADIOACTIVE SEED LOCALIZATION;  Surgeon: Rolm Bookbinder, MD;  Location: [facility];  Service: General;  Laterality: Right;  [place]   lasik   RADIOACTIVE SEED GUIDED EXCISIONAL BREAST BIOPSY Right 12/17/2021   Procedure: RIGHT BREAST SEED BRACKETED EXCISIONAL BIOPSY;  Surgeon: Rolm Bookbinder, MD;  Location: [facility];  Service: General;  Laterality: Right;   RHINOPLASTY     TUBAL LIGATION     Patient Active Problem List   Diagnosis Date Noted   Osteoporosis with pathological fracture 12/20/2021   Malignant neoplasm of upper-outer quadrant of right breast in female, estrogen receptor positive ([place]) 11/12/2021    PCP: Asencion Noble, MD  REFERRING PROVIDER: Rolm Bookbinder, MD  REFERRING DIAG:  Right Breast Cancer  THERAPY DIAG:  Malignant neoplasm of central portion of right breast in female, estrogen receptor positive ([place])  Abnormal posture  Aftercare following surgery for neoplasm  Localized swelling, mass and lump, trunk  Lymphedema, not elsewhere classified  Rationale for Evaluation and Treatment Rehabilitation  ONSET DATE: 11/08/2022  SUBJECTIVE:  SUBJECTIVE STATEMENT:   I think I have done well. I have been doing the MLD for the swelling under my arm. I feel some swelling in my elbow and forearm that seems to be worse.I have had a rash since I started radiation. I use some  radiation cream cream and hydrocortisone on it and that helps. I have had intermittent swelling in the right arm and back of hand, but it looks pretty good today. It feels tight in my forearm and tender    PERTINENT HISTORY:  Patient was diagnosed on 11/07/2021 with right Invasive Ductal Carcinoma grade 1. It measures .7  and . 3  cm and is located in the central quadrant. It is ER+, PR=+,Her 2- with a  low Ki67 .She has a sister with breast cancer in her 72s who she thinks may have some unknown genetic mutation. She has a maternal grandmother and 5 maternal aunts with breast cancer. She thinks her mother had ovarian cancer. She has a brother and sister with colon cancer. She has a brother and her father both having melanoma.  She had a right lumpectomy x 2 on 12/17/2021 with 0/1 lymph nodes removed.  Pt had difficulty with skin rash post op. She has radiation til 02/12/2022   PATIENT GOALS:  Reassess how my recovery is going related to arm function, pain, and swelling.  PAIN:  Are you having pain?  No, tenderness in forearm  PRECAUTIONS: Recent Surgery, right UE Lymphedema risk current  radiation   ACTIVITY LEVEL / LEISURE: pt has been doing her post op shoulder exercises  She has not been able to walk because it has been so hot but wants to get back to it as she normally is a "walker" 3- 5 miles a day    OBJECTIVE:    OBSERVATIONS:  Pt  has healed incisions on right breast and axilla with only slight darkening of skin from radiation.  The swelling in her left lateral chest is much reduced. She does still have a large firm area with well defined borders under the axillary incision   PALPATION: Small cords palpable in right axillary region but not really visible, likely extending down into the forearm causing tenderness pt is experiencing  POSTURE: forward head, rounded shoulders,   UPPER EXTREMITY AROM/PROM:   A/PROM RIGHT  11/12/2021   02/05/2022   Shoulder extension 65    Shoulder flexion 148 162   Shoulder abduction 170 175   Shoulder internal rotation 70    Shoulder external rotation 100                            (Blank rows = not tested)   A/PROM LEFT  11/12/2021  Shoulder extension 57  Shoulder flexion 150  Shoulder abduction 165  Shoulder internal rotation 70  Shoulder external rotation 85                          (Blank rows = not tested)   LYMPHEDEMA ASSESSMENTS:    LANDMARK RIGHT  11/12/2021 RIGHT 01/22/2022 02/28/2022  10 cm proximal to olecranon process 32.3 33 33.2  Olecranon process 27._0 cm proximal to ulnar styloid process 23.7 24.3 24.0  Just proximal to ulnar styloid process 17.9 17.5 17.7  Across hand at thumb web space 19.9 20 19.9  At base of 2nd digit 6.9 7 6.8  (Blank rows = not tested)  LANDMARK LEFT  11/12/2021 02/28/2022  10 cm proximal to olecranon process 34.5 33.6  Olecranon process 27.5 27.5  10 cm proximal to ulnar styloid process 23.8 23.6  Just proximal to ulnar styloid process 17.3 17.8  Across hand at thumb web space 18.8 19.3  At base of 2nd digit 6.7 6.9  (Blank rows = not tested)               TREATMENT TODAY: 02/28/2022 Re-evaluated pt, performed SOZO approx 1 month early which put pt in the red Lymphedema zone. Measured for garments and fit pt with class 1 size 3 Black Medi Harmony Sleeve and gauntlet. Showed pt how to don, and discussed wear time in day only and remove at night. Discussed benefit of learning MLD for the arm, and exercises for cording release   02/05/2022:  Remeasured right shoulder ROM.  Pt has achieved full return of motion Reviewed MLD technique emphasizing that pt avoid radiation field . Used mirror to show that she could do skin stretch on abdomen and still achieve skin movement at axilla without direct contact on radiated skin.  Performed MLD to left  axillary nodes, anterior interaxillary anastamosis, right inguinal nodes with right axillo-inguinal anastamosis. Right lateral trunk and posterior interaxillary anastmosis verbally reviewing how she could ask her husband to do this across her back. Pt still has fullness in axilla, but it is much less. She is wearing her Prarie compression bra which she says has helped a lot. Gave her a foam patch to wear at her axilla in the future ( after radiation is complete) if the fullness in her axilla persists.  Will discharge and send note to Dr. Donne Hazel      01/22/2022  instructed in supine dowel exercises to improve shoulder ROM. Briefly, performed MLD to right chest and axilla: short neck, left axillary nodes and interaxillay anastamosis, right inguinal nodes and left axillo-inguinal anastamosis, right axilla. Also instructed in self MLD technique and to start wearing her compression bra up into axilla with tg soft given to pad the top of bra which was irritating to her     PATIENT EDUCATION:  Education details: reviewed MLD  Person educated: Patient Education method: Explanation   Education comprehension: verbalized understanding and returned demonstration    ASSESSMENT:  CLINICAL IMPRESSION: Pt returns to therapy  today with concerns of tightness in.her right forearm and intermittent swelling in her right arm and back of hand. She was found to have several small palpable but not visible cords in the right axillary region and upper arm, that likely extend into forearm causing pt the tightness she is experiencing. Her SOZO screen was done a month early secondary to concerns of swelling and her score put her well above her baseline into the red zone. She was fit in a compression sleeve and gauntlet and educated how to don, and how long to wear. Reminded not to wear at night, and to check her fingers and be sure they don't swell.  Pt will benefit from skilled therapeutic intervention to improve on the following deficits: Decreased knowledge of precautions, impaired UE functional use, pain, swelling,decreased ROM, postural dysfunction.   PT treatment/interventions: ADL/Self care home management, Therapeutic exercises, Therapeutic activity, Patient/Family education, Self Care, Orthotic/Fit training, DME instructions, Manual lymph drainage, Compression bandaging, scar mobilization, and Manual therapy     GOALS: Goals reviewed with patient? Yes  LONG TERM GOALS:  (STG=LTG)  GOALS Name Target Date  Goal status  1 Pt will demonstrate she  has regained full shoulder ROM and function post operatively compared to baselines.  Baseline: 03/28/2022 MET  2 Pt will report the discomfort from fullness in her chest and right axilla is decreased by 50% 03/28/2022 MET  3 Pt will be independent in self MLD to the Right UE and trunk to decrease swelling 04/11/2022 RE-eval  4 Pt will have no complaints of right UE tightness/tenderness from cording 04/11/2022 RE-EVAL  5 Pts. SOZO score will be reduced to be within the green zone 04/11/2022 RE-EVAL          PLAN: PT FREQUENCY/DURATION: 1-2x/week for  6 weeks   PLAN FOR NEXT SESSION: How was sleeve,Continue SOZO screening. Instruct MLD to the right UE, MFR to cording in axilla  and arm, Review UE stretches/NTS to address cording, STM prn    Claris Pong, PT 02/28/2022, 2:20 PM

## 2022-03-03 ENCOUNTER — Ambulatory Visit (HOSPITAL_COMMUNITY)
Admission: RE | Admit: 2022-03-03 | Discharge: 2022-03-03 | Disposition: A | Discharge status: Home / Self Care | Payer: Medicare PPO | Source: Ambulatory Visit | Attending: Hematology and Oncology | Admitting: Hematology and Oncology

## 2022-03-03 ENCOUNTER — Telehealth: Payer: Self-pay | Admitting: *Deleted

## 2022-03-03 DIAGNOSIS — M8589 Other specified disorders of bone density and structure, multiple sites: Secondary | ICD-10-CM | POA: Insufficient documentation

## 2022-03-03 DIAGNOSIS — Z170 Estrogen receptor positive status [ER+]: Secondary | ICD-10-CM | POA: Diagnosis not present

## 2022-03-03 DIAGNOSIS — C50411 Malignant neoplasm of upper-outer quadrant of right female breast: Secondary | ICD-10-CM | POA: Diagnosis not present

## 2022-03-03 DIAGNOSIS — M8000XA Age-related osteoporosis with current pathological fracture, unspecified site, initial encounter for fracture: Secondary | ICD-10-CM | POA: Insufficient documentation

## 2022-03-03 DIAGNOSIS — Z780 Asymptomatic menopausal state: Secondary | ICD-10-CM | POA: Diagnosis not present

## 2022-03-03 NOTE — Telephone Encounter (Signed)
-----   Message from Gardenia Phlegm, NP sent at 03/03/2022 10:36 AM EDT ----- DEXA shows improvement in osteoporosis to osteopenia.  Would continue prolia if agreeable and recheck in 2 years.  We can discuss further as telephone appt or at her visit in November  ----- Message ----- From: Interface, Rad Results In Sent: 03/03/2022   9:37 AM EDT To: Nicholas Lose, MD

## 2022-03-03 NOTE — Telephone Encounter (Signed)
RN contact pt regarding recent bone density results.  Per NP pt bone density has improved from osteoporosis to osteopenia with recommendations to continue Prolia and re check bone density in 2 years. Pt educated and verbalized understanding.

## 2022-03-04 ENCOUNTER — Ambulatory Visit: Payer: Medicare PPO

## 2022-03-04 DIAGNOSIS — Z483 Aftercare following surgery for neoplasm: Secondary | ICD-10-CM | POA: Diagnosis not present

## 2022-03-04 DIAGNOSIS — R293 Abnormal posture: Secondary | ICD-10-CM

## 2022-03-04 DIAGNOSIS — C50111 Malignant neoplasm of central portion of right female breast: Secondary | ICD-10-CM | POA: Diagnosis not present

## 2022-03-04 DIAGNOSIS — Z170 Estrogen receptor positive status [ER+]: Secondary | ICD-10-CM | POA: Diagnosis not present

## 2022-03-04 DIAGNOSIS — R222 Localized swelling, mass and lump, trunk: Secondary | ICD-10-CM

## 2022-03-04 DIAGNOSIS — I890 Lymphedema, not elsewhere classified: Secondary | ICD-10-CM | POA: Diagnosis not present

## 2022-03-04 NOTE — Patient Instructions (Signed)

## 2022-03-04 NOTE — Therapy (Signed)
OUTPATIENT PHYSICAL THERAPY BREAST CANCER TREATMENT   Patient Name: Christy Flores MRN: [id number] DOB:July 28, 1950, 71 y.o., female Today's Date: 03/04/2022   PT End of Session - 03/04/22 1107     Visit Number 5    Number of Visits 16    Date for PT Re-Evaluation 04/11/22    Authorization Type [employer]    Authorization - Visit Number 2    Authorization - Number of Visits 12    PT Start Time 1107    PT Stop Time 1200    PT Time Calculation (min) 53 min    Activity Tolerance Patient tolerated treatment well    Behavior During Therapy WFL for tasks assessed/performed             Past Medical History:  Diagnosis Date   GERD (gastroesophageal reflux disease)    Senile osteoporosis    Sleep apnea    on CPAP   Past Surgical History:  Procedure Laterality Date   ABDOMINAL HYSTERECTOMY     AXILLARY LYMPH NODE BIOPSY Right 12/17/2021   Procedure: RIGHT AXILLARY SENTINEL LYMPH NODE BIOPSY;  Surgeon: Rolm Bookbinder, MD;  Location: [facility];  Service: General;  Laterality: Right;   BACK SURGERY     BREAST BIOPSY Right 11/27/2021   BREAST BIOPSY Right 11/06/2021   times 2   BREAST LUMPECTOMY WITH RADIOACTIVE SEED LOCALIZATION Right 12/17/2021   Procedure: RIGHT BREAST LUMPECTOMY WITH RADIOACTIVE SEED LOCALIZATION;  Surgeon: Rolm Bookbinder, MD;  Location: [facility];  Service: General;  Laterality: Right;  [place]   RADIOACTIVE SEED GUIDED EXCISIONAL BREAST BIOPSY Right 12/17/2021   Procedure: RIGHT BREAST SEED BRACKETED EXCISIONAL BIOPSY;  Surgeon: Rolm Bookbinder, MD;  Location: [facility];  Service: General;  Laterality: Right;   RHINOPLASTY     TUBAL LIGATION     Patient Active Problem List   Diagnosis Date Noted   Osteoporosis with pathological fracture 12/20/2021   Malignant neoplasm of upper-outer quadrant of right breast in female, estrogen receptor positive  ([place]) 11/12/2021    PCP: Asencion Noble, MD  REFERRING PROVIDER: Rolm Bookbinder, MD  REFERRING DIAG:  Right Breast Cancer  THERAPY DIAG:  Malignant neoplasm of central portion of right breast in female, estrogen receptor positive ([place])  Abnormal posture  Aftercare following surgery for neoplasm  Localized swelling, mass and lump, trunk  Lymphedema, not elsewhere classified  Rationale for Evaluation and Treatment Rehabilitation  ONSET DATE: 11/08/2022  SUBJECTIVE:  SUBJECTIVE STATEMENT:   I have been doing well wearing the sleeve and gauntlet.  I don't have it on now because I overslept. It seems to help.I feel like swelling has reduced. I can still feel the cords in my axillary region and I have been  doing the stretches.    PERTINENT HISTORY:  Patient was diagnosed on 11/07/2021 with right Invasive Ductal Carcinoma grade 1. It measures .7  and . 3  cm and is located in the central quadrant. It is ER+, PR=+,Her 2- with a  low Ki67 .She has a sister with breast cancer in her 36s who she thinks may have some unknown genetic mutation. She has a maternal grandmother and 5 maternal aunts with breast cancer. She thinks her mother had ovarian cancer. She has a brother and sister with colon cancer. She has a brother and her father both having melanoma.  She had a right lumpectomy x 2 on 12/17/2021 with 0/1 lymph nodes removed.  Pt had difficulty with skin rash post op. She has radiation til 02/12/2022   PATIENT GOALS:  Reassess how my recovery is going related to arm function, pain, and swelling.  PAIN:  Are you having pain?  No, tenderness in forearm  PRECAUTIONS: Recent Surgery, right UE Lymphedema risk current radiation   ACTIVITY LEVEL / LEISURE: pt has been doing her post op shoulder exercises  She has not  been able to walk because it has been so hot but wants to get back to it as she normally is a "walker" 3- 5 miles a day    OBJECTIVE:    OBSERVATIONS:  Pt  has healed incisions on right breast and axilla with only slight darkening of skin from radiation.  The swelling in her left lateral chest is much reduced. She does still have a large firm area with well defined borders under the axillary incision   PALPATION: Small cords palpable in right axillary region but not really visible, likely extending down into the forearm causing tenderness pt is experiencing  POSTURE: forward head, rounded shoulders,   UPPER EXTREMITY AROM/PROM:   A/PROM RIGHT  11/12/2021   02/05/2022   Shoulder extension 65    Shoulder flexion 148 162   Shoulder abduction 170 175   Shoulder internal rotation 70    Shoulder external rotation 100                            (Blank rows = not tested)   A/PROM LEFT  11/12/2021  Shoulder extension 57  Shoulder flexion 150  Shoulder abduction 165  Shoulder internal rotation 70  Shoulder external rotation 85                          (Blank rows = not tested)   LYMPHEDEMA ASSESSMENTS:    LANDMARK RIGHT  11/12/2021 RIGHT 01/22/2022 02/28/2022  10 cm proximal to olecranon process 32.3 33 33.2  Olecranon process 27._0 cm proximal to ulnar styloid process 23.7 24.3 24.0  Just proximal to ulnar styloid process 17.9 17.5 17.7  Across hand at thumb web space 19.9 20 19.9  At base of 2nd digit 6.9 7 6.8  (Blank rows = not tested)   LANDMARK LEFT  11/12/2021 02/28/2022  10 cm proximal to olecranon process 34.5 33.6  Olecranon process 27.5 27.5  10 cm proximal to ulnar styloid process 23.8 23.6  Just proximal to  ulnar styloid process 17.3 17.8  Across hand at thumb web space 18.8 19.3  At base of 2nd digit 6.7 6.9  (Blank rows = not tested)              TREATMENT TODAY: 03/04/2022 Discussed laundering of sleeve to tighten it up some Performed MFR on pts Right  axillary, upper arm, antecubital foss and forearm with multiple cords felt. Educated in sigle arm chest to stretch cords with wrist extension. Performed MLD on Pts right UE after MFR; short neck,  5 breaths,Activated left axillary and right inguinal LN's, anterior interaxillary pathway, right axillo-inguinal pathway, outside of right arm, inside to outside, outer arm again then repeating pathways, then bilateral forearm repeating pathways and ending with LN's. Instructed pt in gentle nature of technique and gave pt written instructions.    02/28/2022 Re-evaluated pt, performed SOZO approx 1 month early which put pt in the red Lymphedema zone. Measured for garments and fit pt with class 1 size 3 Black Medi Harmony Sleeve and gauntlet. Showed pt how to don, and discussed wear time in day only and remove at night. Discussed benefit of learning MLD for the arm, and exercises for cording release   02/05/2022:  Remeasured right shoulder ROM.  Pt has achieved full return of motion Reviewed MLD technique emphasizing that pt avoid radiation field . Used mirror to show that she could do skin stretch on abdomen and still achieve skin movement at axilla without direct contact on radiated skin.  Performed MLD to left  axillary nodes, anterior interaxillary anastamosis, right inguinal nodes with right axillo-inguinal anastamosis. Right lateral trunk and posterior interaxillary anastmosis verbally reviewing how she could ask her husband to do this across her back. Pt still has fullness in axilla, but it is much less. She is wearing her Prarie compression bra which she says has helped a lot. Gave her a foam patch to wear at her axilla in the future ( after radiation is complete) if the fullness in her axilla persists.  Will discharge and send note to Dr. Donne Hazel      01/22/2022  instructed in supine dowel exercises to improve shoulder ROM. Briefly, performed MLD to right chest and axilla: short neck, left axillary nodes  and interaxillay anastamosis, right inguinal nodes and left axillo-inguinal anastamosis, right axilla. Also instructed in self MLD technique and to start wearing her compression bra up into axilla with tg soft given to pad the top of bra which was irritating to her     PATIENT EDUCATION:  Education details: reviewed MLD  Person educated: Patient Education method: Explanation   Education comprehension: verbalized understanding and returned demonstration    ASSESSMENT:  CLINICAL IMPRESSION: Pt has been compliant with sleeve wear and when she came in she was still feeling a lot of tightness from cording from the axilla into the forearm.  Multiple cords are felt. Performed MLD after MFR techniques and her arm felt much better at completion of therapy. She is to launder her sleeve tonight.  Pt will benefit from skilled therapeutic intervention to improve on the following deficits: Decreased knowledge of precautions, impaired UE functional use, pain, swelling,decreased ROM, postural dysfunction.   PT treatment/interventions: ADL/Self care home management, Therapeutic exercises, Therapeutic activity, Patient/Family education, Self Care, Orthotic/Fit training, DME instructions, Manual lymph drainage, Compression bandaging, scar mobilization, and Manual therapy     GOALS: Goals reviewed with patient? Yes  LONG TERM GOALS:  (STG=LTG)  GOALS Name Target Date  Goal status  1  Pt will demonstrate she has regained full shoulder ROM and function post operatively compared to baselines.  Baseline: 04/01/2022 MET  2 Pt will report the discomfort from fullness in her chest and right axilla is decreased by 50% 04/01/2022 MET  3 Pt will be independent in self MLD to the Right UE and trunk to decrease swelling 04/11/2022 RE-eval  4 Pt will have no complaints of right UE tightness/tenderness from cording 04/11/2022 RE-EVAL  5 Pts. SOZO score will be reduced to be within the green zone 04/11/2022 RE-EVAL           PLAN: PT FREQUENCY/DURATION: 1-2x/week for  6 weeks   PLAN FOR NEXT SESSION: How was sleeve,Continue SOZO screening. Instruct pt. MLD to the right UE, MFR to cording in axilla and arm, Review UE stretches/NTS to address cording, STM prn    Claris Pong, PT 03/04/2022, 12:04 PM

## 2022-03-06 ENCOUNTER — Ambulatory Visit: Payer: Medicare PPO

## 2022-03-06 DIAGNOSIS — C50111 Malignant neoplasm of central portion of right female breast: Secondary | ICD-10-CM

## 2022-03-06 DIAGNOSIS — R222 Localized swelling, mass and lump, trunk: Secondary | ICD-10-CM | POA: Diagnosis not present

## 2022-03-06 DIAGNOSIS — Z483 Aftercare following surgery for neoplasm: Secondary | ICD-10-CM | POA: Diagnosis not present

## 2022-03-06 DIAGNOSIS — R293 Abnormal posture: Secondary | ICD-10-CM | POA: Diagnosis not present

## 2022-03-06 DIAGNOSIS — I890 Lymphedema, not elsewhere classified: Secondary | ICD-10-CM

## 2022-03-06 DIAGNOSIS — Z170 Estrogen receptor positive status [ER+]: Secondary | ICD-10-CM | POA: Diagnosis not present

## 2022-03-06 NOTE — Therapy (Signed)
OUTPATIENT PHYSICAL THERAPY BREAST CANCER TREATMENT   Patient Name: Christy Flores MRN: [id number] DOB:02-06-1951, 71 y.o., female Today's Date: 03/06/2022   PT End of Session - 03/06/22 1110     Visit Number 6    Number of Visits 16    Date for PT Re-Evaluation 04/11/22    Authorization Type [employer]    Authorization - Visit Number 3    Authorization - Number of Visits 12    PT Start Time 1110   pt late   PT Stop Time 1155    PT Time Calculation (min) 45 min    Activity Tolerance Patient tolerated treatment well    Behavior During Therapy WFL for tasks assessed/performed             Past Medical History:  Diagnosis Date   GERD (gastroesophageal reflux disease)    Senile osteoporosis    Sleep apnea    on CPAP   Past Surgical History:  Procedure Laterality Date   ABDOMINAL HYSTERECTOMY     AXILLARY LYMPH NODE BIOPSY Right 12/17/2021   Procedure: RIGHT AXILLARY SENTINEL LYMPH NODE BIOPSY;  Surgeon: Rolm Bookbinder, MD;  Location: [facility];  Service: General;  Laterality: Right;   BACK SURGERY     BREAST BIOPSY Right 11/27/2021   BREAST BIOPSY Right 11/06/2021   times 2   BREAST LUMPECTOMY WITH RADIOACTIVE SEED LOCALIZATION Right 12/17/2021   Procedure: RIGHT BREAST LUMPECTOMY WITH RADIOACTIVE SEED LOCALIZATION;  Surgeon: Rolm Bookbinder, MD;  Location: [facility];  Service: General;  Laterality: Right;  [place]   RADIOACTIVE SEED GUIDED EXCISIONAL BREAST BIOPSY Right 12/17/2021   Procedure: RIGHT BREAST SEED BRACKETED EXCISIONAL BIOPSY;  Surgeon: Rolm Bookbinder, MD;  Location: [facility];  Service: General;  Laterality: Right;   RHINOPLASTY     TUBAL LIGATION     Patient Active Problem List   Diagnosis Date Noted   Osteoporosis with pathological fracture 12/20/2021   Malignant neoplasm of upper-outer quadrant of right breast in female, estrogen receptor  positive ([place]) 11/12/2021    PCP: Asencion Noble, MD  REFERRING PROVIDER: Rolm Bookbinder, MD  REFERRING DIAG:  Right Breast Cancer  THERAPY DIAG:  Malignant neoplasm of central portion of right breast in female, estrogen receptor positive ([place])  Abnormal posture  Aftercare following surgery for neoplasm  Localized swelling, mass and lump, trunk  Lymphedema, not elsewhere classified  Rationale for Evaluation and Treatment Rehabilitation  ONSET DATE: 11/08/2022  SUBJECTIVE:  SUBJECTIVE STATEMENT:   I tried the MLD yesterday. My arm felt good after last visit and I don't feel as many of the cords. Even the knot under my arm has gone down.    PERTINENT HISTORY:  Patient was diagnosed on 11/07/2021 with right Invasive Ductal Carcinoma grade 1. It measures .7  and . 3  cm and is located in the central quadrant. It is ER+, PR=+,Her 2- with a  low Ki67 .She has a sister with breast cancer in her 5s who she thinks may have some unknown genetic mutation. She has a maternal grandmother and 5 maternal aunts with breast cancer. She thinks her mother had ovarian cancer. She has a brother and sister with colon cancer. She has a brother and her father both having melanoma.  She had a right lumpectomy x 2 on 12/17/2021 with 0/1 lymph nodes removed.  Pt had difficulty with skin rash post op. She has radiation til 02/12/2022   PATIENT GOALS:  Reassess how my recovery is going related to arm function, pain, and swelling.  PAIN:  Are you having pain?  No, tenderness in forearm  PRECAUTIONS: Recent Surgery, right UE Lymphedema risk current radiation   ACTIVITY LEVEL / LEISURE: pt has been doing her post op shoulder exercises  She has not been able to walk because it has been so hot but wants to get back to it as she  normally is a "walker" 3- 5 miles a day    OBJECTIVE:    OBSERVATIONS:  Pt  has healed incisions on right breast and axilla with only slight darkening of skin from radiation.  The swelling in her left lateral chest is much reduced. She does still have a large firm area with well defined borders under the axillary incision   PALPATION: Small cords palpable in right axillary region but not really visible, likely extending down into the forearm causing tenderness pt is experiencing  POSTURE: forward head, rounded shoulders,   UPPER EXTREMITY AROM/PROM:   A/PROM RIGHT  11/12/2021   02/05/2022   Shoulder extension 65    Shoulder flexion 148 162   Shoulder abduction 170 175   Shoulder internal rotation 70    Shoulder external rotation 100                            (Blank rows = not tested)   A/PROM LEFT  11/12/2021  Shoulder extension 57  Shoulder flexion 150  Shoulder abduction 165  Shoulder internal rotation 70  Shoulder external rotation 85                          (Blank rows = not tested)   LYMPHEDEMA ASSESSMENTS:    LANDMARK RIGHT  11/12/2021 RIGHT 01/22/2022 02/28/2022  10 cm proximal to olecranon process 32.3 33 33.2  Olecranon process 27._0 cm proximal to ulnar styloid process 23.7 24.3 24.0  Just proximal to ulnar styloid process 17.9 17.5 17.7  Across hand at thumb web space 19.9 20 19.9  At base of 2nd digit 6.9 7 6.8  (Blank rows = not tested)   LANDMARK LEFT  11/12/2021 02/28/2022  10 cm proximal to olecranon process 34.5 33.6  Olecranon process 27.5 27.5  10 cm proximal to ulnar styloid process 23.8 23.6  Just proximal to ulnar styloid process 17.3 17.8  Across hand at thumb web space 18.8 19.3  At base  of 2nd digit 6.7 6.9  (Blank rows = not tested)              TREATMENT TODAY: 03/06/2022 Performed MFR on pts Right axillary, upper arm, antecubital foss and forearm with fewer cords felt.  In sitting; pt instructed in self MLD with PT reading  instructions. short neck,  5 breaths,Activated left axillary and right inguinal LN's, anterior interaxillary pathway, right axillo-inguinal pathway, outside of right arm, inside to outside, outer arm again then repeating pathways, then bilateral forearm repeating pathways and ending with LN's. Pt donned compression garments after treatment 03/04/2022 Discussed laundering of sleeve to tighten it up some Performed MFR on pts Right axillary, upper arm, antecubital foss and forearm with multiple cords felt. Educated in sigle arm chest to stretch cords with wrist extension. Performed MLD on Pts right UE after MFR; short neck,  5 breaths,Activated left axillary and right inguinal LN's, anterior interaxillary pathway, right axillo-inguinal pathway, outside of right arm, inside to outside, outer arm again then repeating pathways, then bilateral forearm repeating pathways and ending with LN's. Instructed pt in gentle nature of technique and gave pt written instructions.    02/28/2022 Re-evaluated pt, performed SOZO approx 1 month early which put pt in the red Lymphedema zone. Measured for garments and fit pt with class 1 size 3 Black Medi Harmony Sleeve and gauntlet. Showed pt how to don, and discussed wear time in day only and remove at night. Discussed benefit of learning MLD for the arm, and exercises for cording release    PATIENT EDUCATION:  Education details: reviewed MLD  Person educated: Patient Education method: Explanation   Education comprehension: verbalized understanding and returned demonstration    ASSESSMENT:  CLINICAL IMPRESSION: Cording is still present but less prominent and with decreased sensation of tightness for pt. Pt did well with Self MLD and has a fairly good understanding of the sequence with occasional VC's. She required VC's for light touch and techniques but did very well after practicing.     Pt will benefit from skilled therapeutic intervention to improve on the  following deficits: Decreased knowledge of precautions, impaired UE functional use, pain, swelling,decreased ROM, postural dysfunction.   PT treatment/interventions: ADL/Self care home management, Therapeutic exercises, Therapeutic activity, Patient/Family education, Self Care, Orthotic/Fit training, DME instructions, Manual lymph drainage, Compression bandaging, scar mobilization, and Manual therapy     GOALS: Goals reviewed with patient? Yes  LONG TERM GOALS:  (STG=LTG)  GOALS Name Target Date  Goal status  1 Pt will demonstrate she has regained full shoulder ROM and function post operatively compared to baselines.  Baseline: 04/03/2022 MET  2 Pt will report the discomfort from fullness in her chest and right axilla is decreased by 50% 04/03/2022 MET  3 Pt will be independent in self MLD to the Right UE and trunk to decrease swelling 04/11/2022 RE-eval  4 Pt will have no complaints of right UE tightness/tenderness from cording 04/11/2022 RE-EVAL  5 Pts. SOZO score will be reduced to be within the green zone 04/11/2022 RE-EVAL          PLAN: PT FREQUENCY/DURATION: 1-2x/week for  6 weeks   PLAN FOR NEXT SESSION: How was sleeve,Continue SOZO screening. Instruct pt. MLD to the right UE, MFR to cording in axilla and arm, Review UE stretches/NTS to address cording, STM prn    Claris Pong, PT 03/06/2022, 12:07 PM

## 2022-03-11 ENCOUNTER — Encounter: Payer: Self-pay | Admitting: Physical Therapy

## 2022-03-11 ENCOUNTER — Ambulatory Visit: Payer: Medicare PPO | Attending: General Surgery | Admitting: Physical Therapy

## 2022-03-11 DIAGNOSIS — Z483 Aftercare following surgery for neoplasm: Secondary | ICD-10-CM | POA: Insufficient documentation

## 2022-03-11 DIAGNOSIS — R293 Abnormal posture: Secondary | ICD-10-CM | POA: Insufficient documentation

## 2022-03-11 DIAGNOSIS — Z170 Estrogen receptor positive status [ER+]: Secondary | ICD-10-CM | POA: Insufficient documentation

## 2022-03-11 DIAGNOSIS — I890 Lymphedema, not elsewhere classified: Secondary | ICD-10-CM | POA: Insufficient documentation

## 2022-03-11 DIAGNOSIS — C50111 Malignant neoplasm of central portion of right female breast: Secondary | ICD-10-CM | POA: Diagnosis not present

## 2022-03-11 DIAGNOSIS — R222 Localized swelling, mass and lump, trunk: Secondary | ICD-10-CM | POA: Diagnosis not present

## 2022-03-11 NOTE — Therapy (Signed)
OUTPATIENT PHYSICAL THERAPY BREAST CANCER TREATMENT   Patient Name: Christy Flores MRN: [id number] DOB:[birth year], 71 y.o., female Today's Date: 03/11/2022   PT End of Session - 03/11/22 1352     Visit Number 8    Date for PT Re-Evaluation 04/11/22    PT Start Time 1305    PT Stop Time 8657    PT Time Calculation (min) 48 min    Activity Tolerance Patient tolerated treatment well    Behavior During Therapy WFL for tasks assessed/performed             Past Medical History:  Diagnosis Date   GERD (gastroesophageal reflux disease)    Senile osteoporosis    Sleep apnea    on CPAP   Past Surgical History:  Procedure Laterality Date   ABDOMINAL HYSTERECTOMY     AXILLARY LYMPH NODE BIOPSY Right 12/17/2021   Procedure: RIGHT AXILLARY SENTINEL LYMPH NODE BIOPSY;  Surgeon: Rolm Bookbinder, MD;  Location: [facility];  Service: General;  Laterality: Right;   BACK SURGERY     BREAST BIOPSY Right 11/27/2021   BREAST BIOPSY Right 11/06/2021   times 2   BREAST LUMPECTOMY WITH RADIOACTIVE SEED LOCALIZATION Right 12/17/2021   Procedure: RIGHT BREAST LUMPECTOMY WITH RADIOACTIVE SEED LOCALIZATION;  Surgeon: Rolm Bookbinder, MD;  Location: [facility];  Service: General;  Laterality: Right;  [place]   lasik   RADIOACTIVE SEED GUIDED EXCISIONAL BREAST BIOPSY Right 12/17/2021   Procedure: RIGHT BREAST SEED BRACKETED EXCISIONAL BIOPSY;  Surgeon: Rolm Bookbinder, MD;  Location: [facility];  Service: General;  Laterality: Right;   RHINOPLASTY     TUBAL LIGATION     Patient Active Problem List   Diagnosis Date Noted   Osteoporosis with pathological fracture 12/20/2021   Malignant neoplasm of upper-outer quadrant of right breast in female, estrogen receptor positive ([place]) 11/12/2021    PCP: Asencion Noble, MD  REFERRING PROVIDER: Rolm Bookbinder, MD  REFERRING DIAG:  Right Breast  Cancer  THERAPY DIAG:  Malignant neoplasm of central portion of right breast in female, estrogen receptor positive ([place])  Abnormal posture  Aftercare following surgery for neoplasm  Localized swelling, mass and lump, trunk  Lymphedema, not elsewhere classified  Rationale for Evaluation and Treatment Rehabilitation  ONSET DATE: 11/08/2022  SUBJECTIVE:                                                                                                                                                                                           SUBJECTIVE STATEMENT:   Pt  states she doing well.  She helped her brother move yesterday and forgot to wear her sleeve so she might have a little more fullness in her forearm. She is trying to elevated and do her self MLD in her forearm at home. She comes in with sleeve and gauntlet today     PERTINENT HISTORY:  Patient was diagnosed on 11/07/2021 with right Invasive Ductal Carcinoma grade 1. It measures .7  and . 3  cm and is located in the central quadrant. It is ER+, PR=+,Her 2- with a  low Ki67 .She has a sister with breast cancer in her 34s who she thinks may have some unknown genetic mutation. She has a maternal grandmother and 5 maternal aunts with breast cancer. She thinks her mother had ovarian cancer. She has a brother and sister with colon cancer. She has a brother and her father both having melanoma.  She had a right lumpectomy x 2 on 12/17/2021 with 0/1 lymph nodes removed.  Pt had difficulty with skin rash post op. She has radiation til 02/12/2022   PATIENT GOALS:  Reassess how my recovery is going related to arm function, pain, and swelling.  PAIN:  Are you having pain?  No  PRECAUTIONS: Recent Surgery, right UE Lymphedema risk current radiation   ACTIVITY LEVEL / LEISURE: pt has been doing her post op shoulder exercises  She has not been able to walk because it has been so hot but wants to get back to it as she normally is a "walker" 3- 5 miles a  day    OBJECTIVE:    OBSERVATIONS:  Pt  has healed incisions on right breast and axilla with only slight darkening of skin from radiation.  The swelling in her left lateral chest is much reduced. She does still have a firm area with well defined borders under the axillary incision but says that it is getting better   PALPATION: Small cords palpable in right antecubital area when she puts her arm a certain way POSTURE: forward head, rounded shoulders,   UPPER EXTREMITY AROM/PROM:   A/PROM RIGHT  11/12/2021   02/05/2022   Shoulder extension 65    Shoulder flexion 148 162   Shoulder abduction 170 175   Shoulder internal rotation 70    Shoulder external rotation 100                            (Blank rows = not tested)   A/PROM LEFT  11/12/2021  Shoulder extension 57  Shoulder flexion 150  Shoulder abduction 165  Shoulder internal rotation 70  Shoulder external rotation 85                          (Blank rows = not tested)   LYMPHEDEMA ASSESSMENTS:    LANDMARK RIGHT  11/12/2021 RIGHT 01/22/2022 02/28/2022  10 cm proximal to olecranon process 32.3 33 33.2  Olecranon process 27.'2 27 28  10 ' cm proximal to ulnar styloid process 23.7 24.3 24.0  Just proximal to ulnar styloid process 17.9 17.5 17.7  Across hand at thumb web space 19.9 20 19.9  At base of 2nd digit 6.9 7 6.8  (Blank rows = not tested)   LANDMARK LEFT  11/12/2021 02/28/2022  10 cm proximal to olecranon process 34.5 33.6  Olecranon process 27.5 27.5  10 cm proximal to ulnar styloid process 23.8 23.6  Just proximal to ulnar styloid process 17.3  17.8  Across hand at thumb web space 18.8 19.3  At base of 2nd digit 6.7 6.9  (Blank rows = not tested)              TREATMENT TODAY:  03/11/2022 :   Began with cervical and upper thoracic ROM in sitting with shoulder ROM. Then, perfomed MLD to rught upper quadrant: short neck, superficial and deep abdominlas, left axillary nodes, anterior interaxillary anastamosis, right  inguinal nodes, right axillo-inguinal anastamosis and lateral trunk, right upper and lower arm to hand with return along pathways, then to sidelying for posterior interaxillary anastamosis and more work on lateral trunk expecially at posterior axilla where she seems to have more congestions.  Followed up in supine with gentle pressure on fullness in axilla while doing more MLD on trunk. Finally, open book stretch with arm on chest and lower trunk rotation with arms in goal post for more active stretch to chest and lateral trunk   03/06/2022 Performed MFR on pts Right axillary, upper arm, antecubital foss and forearm with fewer cords felt.  In sitting; pt instructed in self MLD with PT reading instructions. short neck,  5 breaths,Activated left axillary and right inguinal LN's, anterior interaxillary pathway, right axillo-inguinal pathway, outside of right arm, inside to outside, outer arm again then repeating pathways, then bilateral forearm repeating pathways and ending with LN's. Pt donned compression garments after treatment 03/04/2022 Discussed laundering of sleeve to tighten it up some Performed MFR on pts Right axillary, upper arm, antecubital foss and forearm with multiple cords felt. Educated in sigle arm chest to stretch cords with wrist extension. Performed MLD on Pts right UE after MFR; short neck,  5 breaths,Activated left axillary and right inguinal LN's, anterior interaxillary pathway, right axillo-inguinal pathway, outside of right arm, inside to outside, outer arm again then repeating pathways, then bilateral forearm repeating pathways and ending with LN's. Instructed pt in gentle nature of technique and gave pt written instructions.    02/28/2022 Re-evaluated pt, performed SOZO approx 1 month early which put pt in the red Lymphedema zone. Measured for garments and fit pt with class 1 size 3 Black Medi Harmony Sleeve and gauntlet. Showed pt how to don, and discussed wear time in day only  and remove at night. Discussed benefit of learning MLD for the arm, and exercises for cording release    PATIENT EDUCATION:  Education details: reviewed MLD , encouraged pt to wear compression bra  Person educated: Patient Education method: Explanation   Education comprehension: verbalized understanding and returned demonstration    ASSESSMENT:  CLINICAL IMPRESSION:   Pt had reduction in subcutaneous congestion in arm, especailly forearm, with elevation  and MLD.  She will benefit from continued use of compression and continued PT for MLD and exercise.    Pt will benefit from skilled therapeutic intervention to improve on the following deficits: Decreased knowledge of precautions, impaired UE functional use, pain, swelling,decreased ROM, postural dysfunction.   PT treatment/interventions: ADL/Self care home management, Therapeutic exercises, Therapeutic activity, Patient/Family education, Self Care, Orthotic/Fit training, DME instructions, Manual lymph drainage, Compression bandaging, scar mobilization, and Manual therapy     GOALS: Goals reviewed with patient? Yes  LONG TERM GOALS:  (STG=LTG)  GOALS Name Target Date  Goal status  1 Pt will demonstrate she has regained full shoulder ROM and function post operatively compared to baselines.  Baseline: 04/08/2022 MET  2 Pt will report the discomfort from fullness in her chest and right axilla is decreased by 50%  04/08/2022 MET  3 Pt will be independent in self MLD to the Right UE and trunk to decrease swelling 04/11/2022 RE-eval  4 Pt will have no complaints of right UE tightness/tenderness from cording 04/11/2022 RE-EVAL  5 Pts. SOZO score will be reduced to be within the green zone 04/11/2022 RE-EVAL          PLAN: PT FREQUENCY/DURATION: 1-2x/week for  6 weeks   PLAN FOR NEXT SESSION: How was sleeve,Continue SOZO screening. Instruct pt. MLD to the right UE, MFR to cording in axilla and arm, Review UE stretches/NTS to address  cording, STM prn   Donato Heinz. Owens Shark PT  Norwood Levo, PT 03/11/2022, 1:54 PM

## 2022-03-12 ENCOUNTER — Ambulatory Visit: Payer: Medicare PPO | Admitting: Physical Therapy

## 2022-03-12 DIAGNOSIS — R293 Abnormal posture: Secondary | ICD-10-CM

## 2022-03-12 DIAGNOSIS — I890 Lymphedema, not elsewhere classified: Secondary | ICD-10-CM | POA: Diagnosis not present

## 2022-03-12 DIAGNOSIS — C50111 Malignant neoplasm of central portion of right female breast: Secondary | ICD-10-CM | POA: Diagnosis not present

## 2022-03-12 DIAGNOSIS — R222 Localized swelling, mass and lump, trunk: Secondary | ICD-10-CM | POA: Diagnosis not present

## 2022-03-12 DIAGNOSIS — Z483 Aftercare following surgery for neoplasm: Secondary | ICD-10-CM | POA: Diagnosis not present

## 2022-03-12 DIAGNOSIS — Z170 Estrogen receptor positive status [ER+]: Secondary | ICD-10-CM | POA: Diagnosis not present

## 2022-03-12 NOTE — Therapy (Signed)
OUTPATIENT PHYSICAL THERAPY BREAST CANCER TREATMENT   Patient Name: Christy Flores MRN: [id number] DOB:[birth year], 71 y.o., female Today's Date: 03/12/2022   PT End of Session - 03/12/22 1203     Visit Number 8    Number of Visits 16    Date for PT Re-Evaluation 04/11/22    Authorization - Visit Number 5    Authorization - Number of Visits 12    PT Start Time 4917    PT Stop Time 9150    PT Time Calculation (min) 61 min    Activity Tolerance Patient tolerated treatment well    Behavior During Therapy WFL for tasks assessed/performed              Past Medical History:  Diagnosis Date   GERD (gastroesophageal reflux disease)    Senile osteoporosis    Sleep apnea    on CPAP   Past Surgical History:  Procedure Laterality Date   ABDOMINAL HYSTERECTOMY     AXILLARY LYMPH NODE BIOPSY Right 12/17/2021   Procedure: RIGHT AXILLARY SENTINEL LYMPH NODE BIOPSY;  Surgeon: Rolm Bookbinder, MD;  Location: [facility];  Service: General;  Laterality: Right;   BACK SURGERY     BREAST BIOPSY Right 11/27/2021   BREAST BIOPSY Right 11/06/2021   times 2   BREAST LUMPECTOMY WITH RADIOACTIVE SEED LOCALIZATION Right 12/17/2021   Procedure: RIGHT BREAST LUMPECTOMY WITH RADIOACTIVE SEED LOCALIZATION;  Surgeon: Rolm Bookbinder, MD;  Location: [facility];  Service: General;  Laterality: Right;  [place]   lasik   RADIOACTIVE SEED GUIDED EXCISIONAL BREAST BIOPSY Right 12/17/2021   Procedure: RIGHT BREAST SEED BRACKETED EXCISIONAL BIOPSY;  Surgeon: Rolm Bookbinder, MD;  Location: [facility];  Service: General;  Laterality: Right;   RHINOPLASTY     TUBAL LIGATION     Patient Active Problem List   Diagnosis Date Noted   Osteoporosis with pathological fracture 12/20/2021   Malignant neoplasm of upper-outer quadrant of right breast in female, estrogen receptor positive ([place]) 11/12/2021    PCP:  Asencion Noble, MD  REFERRING PROVIDER: Rolm Bookbinder, MD  REFERRING DIAG:  Right Breast Cancer  THERAPY DIAG:  Malignant neoplasm of central portion of right breast in female, estrogen receptor positive ([place])  Abnormal posture  Localized swelling, mass and lump, trunk  Lymphedema, not elsewhere classified  Aftercare following surgery for neoplasm  Rationale for Evaluation and Treatment Rehabilitation  ONSET DATE: 11/08/2022  SUBJECTIVE:  SUBJECTIVE STATEMENT:   Pt comes in wearing compression bra and sleeve. She says that her arm felt a little better after treatment, but she felt tightness in forearm when she wakes up in the morning.   PERTINENT HISTORY:  Patient was diagnosed on 11/07/2021 with right Invasive Ductal Carcinoma grade 1. It measures .7  and . 3  cm and is located in the central quadrant. It is ER+, PR=+,Her 2- with a  low Ki67 .She has a sister with breast cancer in her 65s who she thinks may have some unknown genetic mutation. She has a maternal grandmother and 5 maternal aunts with breast cancer. She thinks her mother had ovarian cancer. She has a brother and sister with colon cancer. She has a brother and her father both having melanoma.  She had a right lumpectomy x 2 on 12/17/2021 with 0/1 lymph nodes removed.  Pt had difficulty with skin rash post op. She has radiation til 02/12/2022   PATIENT GOALS:  Reassess how my recovery is going related to arm function, pain, and swelling.  PAIN:  Are you having pain?  No  PRECAUTIONS: Recent Surgery, right UE Lymphedema risk current radiation   ACTIVITY LEVEL / LEISURE: pt has been doing her post op shoulder exercises  She has not been able to walk because it has been so hot but wants to get back to it as she normally is a "walker" 3- 5 miles a  day    OBJECTIVE:    OBSERVATIONS:  Pt  has healed incisions on right breast and axilla with only slight darkening of skin from radiation.  The swelling in her left lateral chest is much reduced. She does still have a firm area with well defined borders under the axillary incision but says that it is getting better   PALPATION: Small cords palpable in right antecubital area when she puts her arm a certain way POSTURE: forward head, rounded shoulders,   UPPER EXTREMITY AROM/PROM:   A/PROM RIGHT  11/12/2021   02/05/2022   Shoulder extension 65    Shoulder flexion 148 162   Shoulder abduction 170 175   Shoulder internal rotation 70    Shoulder external rotation 100                            (Blank rows = not tested)   A/PROM LEFT  11/12/2021  Shoulder extension 57  Shoulder flexion 150  Shoulder abduction 165  Shoulder internal rotation 70  Shoulder external rotation 85                          (Blank rows = not tested)   LYMPHEDEMA ASSESSMENTS:    LANDMARK RIGHT  11/12/2021 RIGHT 01/22/2022 02/28/2022 9/6/203  10 cm proximal to olecranon process 32.3 33 33.2 33.2  Olecranon process 27.'2 27 28 ' 27.7  10 cm proximal to ulnar styloid process 23.7 24.3 24.0 23.5  Just proximal to ulnar styloid process 17.9 17.5 17.7 17.5  Across hand at thumb web space 19.9 20 19.9 20  At base of 2nd digit 6.9 7 6.8 6.8  (Blank rows = not tested)   LANDMARK LEFT  11/12/2021 02/28/2022  10 cm proximal to olecranon process 34.5 33.6  Olecranon process 27.5 27.5  10 cm proximal to ulnar styloid process 23.8 23.6  Just proximal to ulnar styloid process 17.3 17.8  Across hand at thumb web space 18.8 19.3  At base of 2nd digit 6.7 6.9  (Blank rows = not tested)              TREATMENT TODAY:  03/12/2022:  Remeasured arm Began with cervical and upper thoracic ROM exercise with emphasis on side bending as well as forearm supination with shoulder ROM. Perfomed MLD to rught upper quadrant: short neck,  superficial and deep abdominlas, left axillary nodes, anterior interaxillary anastamosis, right inguinal nodes, right axillo-inguinal anastamosis and lateral trunk, right upper and lower arm to hand with return along pathways, then to sidelying for posterior interaxillary anastamosis and more work on lateral trunk expecially at posterior. With pt in left sidelying with partial backward rotation with pillow along entire back with arm overhead, full area at axilla becomes more obvious.  Worked more on skin stretch around this area and instructed pt to place pressure on firm area while she does active circles with right arm overhead.  Finally, adjusted compression bra with hooks at shoulders in shortest position with pad at axilla for more compression to firm area. Encouraged pt to continue with exercise at home and she is considering going back to the pool for exercise   03/11/2022 :   Began with cervical and upper thoracic ROM in sitting with shoulder ROM. Then, perfomed MLD to rught upper quadrant: short neck, superficial and deep abdominlas, left axillary nodes, anterior interaxillary anastamosis, right inguinal nodes, right axillo-inguinal anastamosis and lateral trunk, right upper and lower arm to hand with return along pathways, then to sidelying for posterior interaxillary anastamosis and more work on lateral trunk expecially at posterior axilla where she seems to have more congestions.  Followed up in supine with gentle pressure on fullness in axilla while doing more MLD on trunk. Finally, open book stretch with arm on chest and lower trunk rotation with arms in goal post for more active stretch to chest and lateral trunk   03/06/2022 Performed MFR on pts Right axillary, upper arm, antecubital foss and forearm with fewer cords felt.  In sitting; pt instructed in self MLD with PT reading instructions. short neck,  5 breaths,Activated left axillary and right inguinal LN's, anterior interaxillary pathway,  right axillo-inguinal pathway, outside of right arm, inside to outside, outer arm again then repeating pathways, then bilateral forearm repeating pathways and ending with LN's. Pt donned compression garments after treatment 03/04/2022 Discussed laundering of sleeve to tighten it up some Performed MFR on pts Right axillary, upper arm, antecubital foss and forearm with multiple cords felt. Educated in sigle arm chest to stretch cords with wrist extension. Performed MLD on Pts right UE after MFR; short neck,  5 breaths,Activated left axillary and right inguinal LN's, anterior interaxillary pathway, right axillo-inguinal pathway, outside of right arm, inside to outside, outer arm again then repeating pathways, then bilateral forearm repeating pathways and ending with LN's. Instructed pt in gentle nature of technique and gave pt written instructions.    02/28/2022 Re-evaluated pt, performed SOZO approx 1 month early which put pt in the red Lymphedema zone. Measured for garments and fit pt with class 1 size 3 Black Medi Harmony Sleeve and gauntlet. Showed pt how to don, and discussed wear time in day only and remove at night. Discussed benefit of learning MLD for the arm, and exercises for cording release    PATIENT EDUCATION:  Education details: reviewed MLD , encouraged pt to wear compression bra  Person educated: Patient Education method: Explanation   Education comprehension: verbalized understanding and returned demonstration  ASSESSMENT:  CLINICAL IMPRESSION:   Pt had reduction in measuareements of arm, especailly forearm, with skilled PT and compression sleeve. She continues to experience tightness in forearm especailly in the morning after not wearing a sleeve all night   She will benefit from use of a nighttime garment ( Tribute wrap for arm and hand) in addition to her circular knit sleeve, exercise and self MLD.   Pt will benefit from skilled therapeutic intervention to improve on the  following deficits: Decreased knowledge of precautions, impaired UE functional use, pain, swelling,decreased ROM, postural dysfunction.   PT treatment/interventions: ADL/Self care home management, Therapeutic exercises, Therapeutic activity, Patient/Family education, Self Care, Orthotic/Fit training, DME instructions, Manual lymph drainage, Compression bandaging, scar mobilization, and Manual therapy     GOALS: Goals reviewed with patient? Yes  LONG TERM GOALS:  (STG=LTG)  GOALS Name Target Date  Goal status  1 Pt will demonstrate she has regained full shoulder ROM and function post operatively compared to baselines.  Baseline: 04/09/2022 MET  2 Pt will report the discomfort from fullness in her chest and right axilla is decreased by 50% 04/09/2022 MET  3 Pt will be independent in self MLD to the Right UE and trunk to decrease swelling 04/11/2022 RE-eval  4 Pt will have no complaints of right UE tightness/tenderness from cording 04/11/2022 RE-EVAL  5 Pts. SOZO score will be reduced to be within the green zone 04/11/2022 RE-EVAL          PLAN: PT FREQUENCY/DURATION: 1-2x/week for  6 weeks   PLAN FOR NEXT SESSION: Continue MLD to the right UE, MFR to cording in axilla and arm, Review UE stretches/NTS to address cording, STM prnContinue SOZO screening. Consider a Tribute wrap ( is she eligble for [employer] to cover??)    Donato Heinz. Owens Shark PT  Norwood Levo, PT 03/12/2022, 12:05 PM

## 2022-03-18 ENCOUNTER — Ambulatory Visit: Payer: Medicare PPO

## 2022-03-18 DIAGNOSIS — Z170 Estrogen receptor positive status [ER+]: Secondary | ICD-10-CM

## 2022-03-18 DIAGNOSIS — C50111 Malignant neoplasm of central portion of right female breast: Secondary | ICD-10-CM | POA: Diagnosis not present

## 2022-03-18 DIAGNOSIS — Z483 Aftercare following surgery for neoplasm: Secondary | ICD-10-CM

## 2022-03-18 DIAGNOSIS — R293 Abnormal posture: Secondary | ICD-10-CM | POA: Diagnosis not present

## 2022-03-18 DIAGNOSIS — R222 Localized swelling, mass and lump, trunk: Secondary | ICD-10-CM

## 2022-03-18 DIAGNOSIS — I890 Lymphedema, not elsewhere classified: Secondary | ICD-10-CM | POA: Diagnosis not present

## 2022-03-18 NOTE — Patient Instructions (Signed)

## 2022-03-18 NOTE — Therapy (Signed)
OUTPATIENT PHYSICAL THERAPY BREAST CANCER TREATMENT   Patient Name: Christy Flores MRN: [id number] DOB:1950/12/11, 71 y.o., female Today's Date: 03/18/2022   PT End of Session - 03/18/22 1103     Visit Number 9    Number of Visits 16    Date for PT Re-Evaluation 04/11/22    Authorization Type [employer]    Authorization - Visit Number 9    Authorization - Number of Visits 12    PT Start Time 1104    PT Stop Time 8299    PT Time Calculation (min) 54 min    Activity Tolerance Patient tolerated treatment well    Behavior During Therapy WFL for tasks assessed/performed              Past Medical History:  Diagnosis Date   GERD (gastroesophageal reflux disease)    Senile osteoporosis    Sleep apnea    on CPAP   Past Surgical History:  Procedure Laterality Date   ABDOMINAL HYSTERECTOMY     AXILLARY LYMPH NODE BIOPSY Right 12/17/2021   Procedure: RIGHT AXILLARY SENTINEL LYMPH NODE BIOPSY;  Surgeon: Rolm Bookbinder, MD;  Location: [facility];  Service: General;  Laterality: Right;   BACK SURGERY     BREAST BIOPSY Right 11/27/2021   BREAST BIOPSY Right 11/06/2021   times 2   BREAST LUMPECTOMY WITH RADIOACTIVE SEED LOCALIZATION Right 12/17/2021   Procedure: RIGHT BREAST LUMPECTOMY WITH RADIOACTIVE SEED LOCALIZATION;  Surgeon: Rolm Bookbinder, MD;  Location: [facility];  Service: General;  Laterality: Right;  [place]   lasik   RADIOACTIVE SEED GUIDED EXCISIONAL BREAST BIOPSY Right 12/17/2021   Procedure: RIGHT BREAST SEED BRACKETED EXCISIONAL BIOPSY;  Surgeon: Rolm Bookbinder, MD;  Location: [facility];  Service: General;  Laterality: Right;   RHINOPLASTY     TUBAL LIGATION     Patient Active Problem List   Diagnosis Date Noted   Osteoporosis with pathological fracture 12/20/2021   Malignant neoplasm of upper-outer quadrant of right breast in female, estrogen receptor  positive ([place]) 11/12/2021    PCP: Asencion Noble, MD  REFERRING PROVIDER: Rolm Bookbinder, MD  REFERRING DIAG:  Right Breast Cancer  THERAPY DIAG:  Malignant neoplasm of central portion of right breast in female, estrogen receptor positive ([place])  Abnormal posture  Lymphedema, not elsewhere classified  Localized swelling, mass and lump, trunk  Aftercare following surgery for neoplasm  Rationale for Evaluation and Treatment Rehabilitation  ONSET DATE: 11/08/2022  SUBJECTIVE:  SUBJECTIVE STATEMENT:    The bra adjustment is OK and the foam she made seems to help.. My arm has really been sore.I have been doing the MLD and it helps. I can still feel the cord in my forearm. The dorsum of my hand is swelling more.   PERTINENT HISTORY:  Patient was diagnosed on 11/07/2021 with right Invasive Ductal Carcinoma grade 1. It measures .7  and . 3  cm and is located in the central quadrant. It is ER+, PR=+,Her 2- with a  low Ki67 .She has a sister with breast cancer in her 49s who she thinks may have some unknown genetic mutation. She has a maternal grandmother and 5 maternal aunts with breast cancer. She thinks her mother had ovarian cancer. She has a brother and sister with colon cancer. She has a brother and her father both having melanoma.  She had a right lumpectomy x 2 on 12/17/2021 with 0/1 lymph nodes removed.  Pt had difficulty with skin rash post op. She has radiation til 02/12/2022   PATIENT GOALS:  Reassess how my recovery is going related to arm function, pain, and swelling.  PAIN:  Are you having pain?  No  PRECAUTIONS: Recent Surgery, right UE Lymphedema risk current radiation   ACTIVITY LEVEL / LEISURE: pt has been doing her post op shoulder exercises  She has not been able to walk because it has been so  hot but wants to get back to it as she normally is a "walker" 3- 5 miles a day    OBJECTIVE:    OBSERVATIONS:  Pt  has healed incisions on right breast and axilla with only slight darkening of skin from radiation.  The swelling in her left lateral chest is much reduced. She does still have a firm area with well defined borders under the axillary incision but says that it is getting better   PALPATION: Small cords palpable in right antecubital area when she puts her arm a certain way POSTURE: forward head, rounded shoulders,   UPPER EXTREMITY AROM/PROM:   A/PROM RIGHT  11/12/2021   02/05/2022   Shoulder extension 65    Shoulder flexion 148 162   Shoulder abduction 170 175   Shoulder internal rotation 70    Shoulder external rotation 100                            (Blank rows = not tested)   A/PROM LEFT  11/12/2021  Shoulder extension 57  Shoulder flexion 150  Shoulder abduction 165  Shoulder internal rotation 70  Shoulder external rotation 85                          (Blank rows = not tested)   LYMPHEDEMA ASSESSMENTS:    LANDMARK RIGHT  11/12/2021 RIGHT 01/22/2022 02/28/2022 9/6/203 03/18/2022  10 cm proximal to olecranon process 32.3 33 33.2 33.2 33.2  Olecranon process 27.'2 27 28 ' 27.'7 28  10 ' cm proximal to ulnar styloid process 23.7 24.3 24.0 23.5 25  Just proximal to ulnar styloid process 17.9 17.5 17.7 17.5 18.8  Across hand at thumb web space 19.9 20 19.9 20 20.3  At base of 2nd digit 6.9 7 6.8 6.8 6.9  (Blank rows = not tested)   LANDMARK LEFT  11/12/2021 02/28/2022  10 cm proximal to olecranon process 34.5 33.6  Olecranon process 27.5 27.5  10 cm proximal to ulnar styloid process  23.8 23.6  Just proximal to ulnar styloid process 17.3 17.8  Across hand at thumb web space 18.8 19.3  At base of 2nd digit 6.7 6.9  (Blank rows = not tested)              TREATMENT TODAY: 03/18/2022 Pts arm visualized and noted  increased swelling throughout. Signed pt up for garments to  be fit next week. Measured right UE with increased swelling noted. No complaints of significant arm pain or SOB. Perfomed MLD to right upper extremity short neck,, left axillary nodes, anterior interaxillary anastamosis, right inguinal nodes, right axillo-inguinal anastamosis and lateral trunk, right upper and lower arm to hand with return along pathways and ending with left axillary and right inguinal LN. Pt wrapped with medium TG soft: fingerwrap to fingers 1-5 with TG pulled over it, artiflex, 6 cm to hand, 10 cm wrist to axilla, and 12 cm wrist to axilla.  Showed how to check capillary refill and how to exercise to loosen wraps. Advised to remove 1 layer prn if too uncomfortable, and remove all if needed. Asked pt to have her husband come to learn wrapping. Gave her instructions on wearing, laundering bandages.     03/12/2022:  Remeasured arm Began with cervical and upper thoracic ROM exercise with emphasis on side bending as well as forearm supination with shoulder ROM. Perfomed MLD to rught upper quadrant: short neck, superficial and deep abdominlas, left axillary nodes, anterior interaxillary anastamosis, right inguinal nodes, right axillo-inguinal anastamosis and lateral trunk, right upper and lower arm to hand with return along pathways, then to sidelying for posterior interaxillary anastamosis and more work on lateral trunk expecially at posterior. With pt in left sidelying with partial backward rotation with pillow along entire back with arm overhead, full area at axilla becomes more obvious.  Worked more on skin stretch around this area and instructed pt to place pressure on firm area while she does active circles with right arm overhead.  Finally, adjusted compression bra with hooks at shoulders in shortest position with pad at axilla for more compression to firm area. Encouraged pt to continue with exercise at home and she is considering going back to the pool for exercise   03/11/2022 :   Began  with cervical and upper thoracic ROM in sitting with shoulder ROM. Then, perfomed MLD to rught upper quadrant: short neck, superficial and deep abdominlas, left axillary nodes, anterior interaxillary anastamosis, right inguinal nodes, right axillo-inguinal anastamosis and lateral trunk, right upper and lower arm to hand with return along pathways, then to sidelying for posterior interaxillary anastamosis and more work on lateral trunk expecially at posterior axilla where she seems to have more congestions.  Followed up in supine with gentle pressure on fullness in axilla while doing more MLD on trunk. Finally, open book stretch with arm on chest and lower trunk rotation with arms in goal post for more active stretch to chest and lateral trunk   03/06/2022 Performed MFR on pts Right axillary, upper arm, antecubital foss and forearm with fewer cords felt.  In sitting; pt instructed in self MLD with PT reading instructions. short neck,  5 breaths,Activated left axillary and right inguinal LN's, anterior interaxillary pathway, right axillo-inguinal pathway, outside of right arm, inside to outside, outer arm again then repeating pathways, then bilateral forearm repeating pathways and ending with LN's. Pt donned compression garments after treatment 03/04/2022 Discussed laundering of sleeve to tighten it up some Performed MFR on pts Right axillary, upper arm, antecubital  foss and forearm with multiple cords felt. Educated in sigle arm chest to stretch cords with wrist extension. Performed MLD on Pts right UE after MFR; short neck,  5 breaths,Activated left axillary and right inguinal LN's, anterior interaxillary pathway, right axillo-inguinal pathway, outside of right arm, inside to outside, outer arm again then repeating pathways, then bilateral forearm repeating pathways and ending with LN's. Instructed pt in gentle nature of technique and gave pt written instructions.    02/28/2022 Re-evaluated pt, performed  SOZO approx 1 month early which put pt in the red Lymphedema zone. Measured for garments and fit pt with class 1 size 3 Black Medi Harmony Sleeve and gauntlet. Showed pt how to don, and discussed wear time in day only and remove at night. Discussed benefit of learning MLD for the arm, and exercises for cording release    PATIENT EDUCATION:  Education details: reviewed MLD , encouraged pt to wear compression bra  Person educated: Patient Education method: Explanation   Education comprehension: verbalized understanding and returned demonstration    ASSESSMENT:  CLINICAL IMPRESSION:    Pt with significant increase in Right UE swelling today for unknown reasons. Continued MLD to the right UE and trunk and initiated compression bandaging to reduce swelling. Advised pt to remove wraps if pain, numbness or tingling.   Pt will benefit from skilled therapeutic intervention to improve on the following deficits: Decreased knowledge of precautions, impaired UE functional use, pain, swelling,decreased ROM, postural dysfunction.   PT treatment/interventions: ADL/Self care home management, Therapeutic exercises, Therapeutic activity, Patient/Family education, Self Care, Orthotic/Fit training, DME instructions, Manual lymph drainage, Compression bandaging, scar mobilization, and Manual therapy     GOALS: Goals reviewed with patient? Yes  LONG TERM GOALS:  (STG=LTG)  GOALS Name Target Date  Goal status  1 Pt will demonstrate she has regained full shoulder ROM and function post operatively compared to baselines.  Baseline: 04/15/2022 MET  2 Pt will report the discomfort from fullness in her chest and right axilla is decreased by 50% 04/15/2022 MET  3 Pt will be independent in self MLD to the Right UE and trunk to decrease swelling 04/11/2022 RE-eval  4 Pt will have no complaints of right UE tightness/tenderness from cording 04/11/2022 RE-EVAL  5 Pts. SOZO score will be reduced to be within the green  zone 04/11/2022 RE-EVAL          PLAN: PT FREQUENCY/DURATION: 1-2x/week for  6 weeks   PLAN FOR NEXT SESSION: Teach husband wrapping, Continue MLD to the right UE, MFR to cording in axilla and arm, Review UE stretches/NTS to address cording, STM prnContinue SOZO screening. Consider a Tribute wrap ( is she eligble for [employer] to cover??)    Donato Heinz. Owens Shark, PT  Claris Pong, PT 03/18/2022, 1:08 PM

## 2022-03-19 ENCOUNTER — Telehealth: Payer: Self-pay | Admitting: Emergency Medicine

## 2022-03-19 NOTE — Progress Notes (Addendum)
(  I called the patient today about her upcoming follow-up appointment in radiation oncology.   Given the state of the COVID-19 pandemic, concerning case numbers in our community, and guidance from [facility], I offered a phone assessment with the patient to determine if coming to the clinic was necessary. She accepted.  The patient denies any symptomatic concerns.  Specifically, they report good healing of their skin in the radiation fields.  Skin is intact.    I recommended that she continue skin care by applying oil or lotion with vitamin E to the skin in the radiation fields, BID, for 2 more months.  Continue follow-up with medical oncology - follow-up is scheduled on 05/15/2022.  I explained that yearly mammograms are important for patients with intact breast tissue, and physical exams are important after mastectomy for patients that cannot undergo mammography.  I encouraged her to call if she had further questions or concerns about her healing. Otherwise, she will follow-up PRN in radiation oncology. Patient is pleased with this plan, and we will cancel her upcoming follow-up to reduce the risk of COVID-19 transmission.  Patient reports some lymphedema . States that she is currently taking physical therapy and that they are helping her with that. Patient states that her skin has healed .

## 2022-03-20 ENCOUNTER — Ambulatory Visit: Payer: Medicare PPO

## 2022-03-20 DIAGNOSIS — R222 Localized swelling, mass and lump, trunk: Secondary | ICD-10-CM

## 2022-03-20 DIAGNOSIS — R293 Abnormal posture: Secondary | ICD-10-CM | POA: Diagnosis not present

## 2022-03-20 DIAGNOSIS — C50111 Malignant neoplasm of central portion of right female breast: Secondary | ICD-10-CM | POA: Diagnosis not present

## 2022-03-20 DIAGNOSIS — Z170 Estrogen receptor positive status [ER+]: Secondary | ICD-10-CM

## 2022-03-20 DIAGNOSIS — I890 Lymphedema, not elsewhere classified: Secondary | ICD-10-CM | POA: Diagnosis not present

## 2022-03-20 DIAGNOSIS — Z483 Aftercare following surgery for neoplasm: Secondary | ICD-10-CM | POA: Diagnosis not present

## 2022-03-20 NOTE — Patient Instructions (Signed)
Self bandaging the arm:   Follow along with this video:  [email or web address]/[email or web address]  -OR-  Search in your internet browser: "self bandaging arm MD [place]"   And the video should appear in the video search results "Lymphedema Management: Self bandaging your arm" on YouTube   This video is for caregiver assistance:  [email or web address]/[email or web address]  -OR-  Enter " Lymphedema Management: Caregiver Bandaging you arm" into your search browser and your video should appear in the results   

## 2022-03-20 NOTE — Therapy (Signed)
OUTPATIENT PHYSICAL THERAPY BREAST CANCER TREATMENT   Patient Name: Christy Flores MRN: [id number] DOB:1951-01-24, 71 y.o., female Today's Date: 03/20/2022   PT End of Session - 03/20/22 1104     Visit Number 10    Number of Visits 16    Date for PT Re-Evaluation 04/11/22    Authorization Type [employer]    Authorization - Visit Number 10    Authorization - Number of Visits 12    PT Start Time 1104    PT Stop Time 1158    PT Time Calculation (min) 54 min    Activity Tolerance Patient tolerated treatment well    Behavior During Therapy WFL for tasks assessed/performed              Past Medical History:  Diagnosis Date   GERD (gastroesophageal reflux disease)    Senile osteoporosis    Sleep apnea    on CPAP   Past Surgical History:  Procedure Laterality Date   ABDOMINAL HYSTERECTOMY     AXILLARY LYMPH NODE BIOPSY Right 12/17/2021   Procedure: RIGHT AXILLARY SENTINEL LYMPH NODE BIOPSY;  Surgeon: Rolm Bookbinder, MD;  Location: [facility];  Service: General;  Laterality: Right;   BACK SURGERY     BREAST BIOPSY Right 11/27/2021   BREAST BIOPSY Right 11/06/2021   times 2   BREAST LUMPECTOMY WITH RADIOACTIVE SEED LOCALIZATION Right 12/17/2021   Procedure: RIGHT BREAST LUMPECTOMY WITH RADIOACTIVE SEED LOCALIZATION;  Surgeon: Rolm Bookbinder, MD;  Location: [facility];  Service: General;  Laterality: Right;  [place]   RADIOACTIVE SEED GUIDED EXCISIONAL BREAST BIOPSY Right 12/17/2021   Procedure: RIGHT BREAST SEED BRACKETED EXCISIONAL BIOPSY;  Surgeon: Rolm Bookbinder, MD;  Location: [facility];  Service: General;  Laterality: Right;   RHINOPLASTY     TUBAL LIGATION     Patient Active Problem List   Diagnosis Date Noted   Osteoporosis with pathological fracture 12/20/2021   Malignant neoplasm of upper-outer quadrant of right breast in female, estrogen receptor  positive ([place]) 11/12/2021    PCP: Asencion Noble, MD  REFERRING PROVIDER: Rolm Bookbinder, MD  REFERRING DIAG:  Right Breast Cancer  THERAPY DIAG:  Malignant neoplasm of central portion of right breast in female, estrogen receptor positive ([place])  Abnormal posture  Lymphedema, not elsewhere classified  Localized swelling, mass and lump, trunk  Aftercare following surgery for neoplasm  Rationale for Evaluation and Treatment Rehabilitation  ONSET DATE: 11/08/2022  SUBJECTIVE:  SUBJECTIVE STATEMENT:    I slept OK in the wrap.  The index finger was sliding off but other than that it did OK. I got the wraps wet when I showered.  PERTINENT HISTORY:  Patient was diagnosed on 11/07/2021 with right Invasive Ductal Carcinoma grade 1. It measures .7  and . 3  cm and is located in the central quadrant. It is ER+, PR=+,Her 2- with a  low Ki67 .She has a sister with breast cancer in her 29s who she thinks may have some unknown genetic mutation. She has a maternal grandmother and 5 maternal aunts with breast cancer. She thinks her mother had ovarian cancer. She has a brother and sister with colon cancer. She has a brother and her father both having melanoma.  She had a right lumpectomy x 2 on 12/17/2021 with 0/1 lymph nodes removed.  Pt had difficulty with skin rash post op. She has radiation til 02/12/2022   PATIENT GOALS:  Reassess how my recovery is going related to arm function, pain, and swelling.  PAIN:  Are you having pain?  No  PRECAUTIONS: Recent Surgery, right UE Lymphedema risk current radiation   ACTIVITY LEVEL / LEISURE: pt has been doing her post op shoulder exercises  She has not been able to walk because it has been so hot but wants to get back to it as she normally is a "walker" 3- 5 miles a day     OBJECTIVE:    OBSERVATIONS:  Pt  has healed incisions on right breast and axilla with only slight darkening of skin from radiation.  The swelling in her left lateral chest is much reduced. She does still have a firm area with well defined borders under the axillary incision but says that it is getting better   PALPATION: Small cords palpable in right antecubital area when she puts her arm a certain way POSTURE: forward head, rounded shoulders,   UPPER EXTREMITY AROM/PROM:   A/PROM RIGHT  11/12/2021   02/05/2022   Shoulder extension 65    Shoulder flexion 148 162   Shoulder abduction 170 175   Shoulder internal rotation 70    Shoulder external rotation 100                            (Blank rows = not tested)   A/PROM LEFT  11/12/2021  Shoulder extension 57  Shoulder flexion 150  Shoulder abduction 165  Shoulder internal rotation 70  Shoulder external rotation 85                          (Blank rows = not tested)   LYMPHEDEMA ASSESSMENTS:    LANDMARK RIGHT  11/12/2021 RIGHT 01/22/2022 02/28/2022 9/6/203 03/18/2022  10 cm proximal to olecranon process 32.3 33 33.2 33.2 33.2  Olecranon process 27.'2 27 28 ' 27.'7 28  10 ' cm proximal to ulnar styloid process 23.7 24.3 24.0 23.5 25  Just proximal to ulnar styloid process 17.9 17.5 17.7 17.5 18.8  Across hand at thumb web space 19.9 20 19.9 20 20.3  At base of 2nd digit 6.9 7 6.8 6.8 6.9  (Blank rows = not tested)   LANDMARK LEFT  11/12/2021 02/28/2022  10 cm proximal to olecranon process 34.5 33.6  Olecranon process 27.5 27.5  10 cm proximal to ulnar styloid process 23.8 23.6  Just proximal to ulnar styloid process 17.3 17.8  Across hand at thumb web  space 18.8 19.3  At base of 2nd digit 6.7 6.9  (Blank rows = not tested)              TREATMENT TODAY: 9/14/202 Pts wrap removed and was quite wet from showering. Cut new TG soft and supplied new 6 cm wrap. Pts husband was instructed in compression bandaging as follows;TG soft,  finger wrap with elastomull fingers 1-5, Artiflex to axilla, 6 cm hand wrap, 10 cm wrap to axilla, and 12 cm wrap to axilla. Pts husband practiced all wraps x 1 with VC's and TC's required They were given self bandaging website, and also illustrated handouts for bandaging   03/18/2022 Pts arm visualized and noted  increased swelling throughout. Signed pt up for garments to be fit next week. Measured right UE with increased swelling noted. No complaints of significant arm pain or SOB. Perfomed MLD to right upper extremity short neck,, left axillary nodes, anterior interaxillary anastamosis, right inguinal nodes, right axillo-inguinal anastamosis and lateral trunk, right upper and lower arm to hand with return along pathways and ending with left axillary and right inguinal LN. Pt wrapped with medium TG soft: fingerwrap to fingers 1-5 with TG pulled over it, artiflex, 6 cm to hand, 10 cm wrist to axilla, and 12 cm wrist to axilla.  Showed how to check capillary refill and how to exercise to loosen wraps. Advised to remove 1 layer prn if too uncomfortable, and remove all if needed. Asked pt to have her husband come to learn wrapping. Gave her instructions on wearing, laundering bandages.     03/12/2022:  Remeasured arm Began with cervical and upper thoracic ROM exercise with emphasis on side bending as well as forearm supination with shoulder ROM. Perfomed MLD to rught upper quadrant: short neck, superficial and deep abdominlas, left axillary nodes, anterior interaxillary anastamosis, right inguinal nodes, right axillo-inguinal anastamosis and lateral trunk, right upper and lower arm to hand with return along pathways, then to sidelying for posterior interaxillary anastamosis and more work on lateral trunk expecially at posterior. With pt in left sidelying with partial backward rotation with pillow along entire back with arm overhead, full area at axilla becomes more obvious.  Worked more on skin stretch  around this area and instructed pt to place pressure on firm area while she does active circles with right arm overhead.  Finally, adjusted compression bra with hooks at shoulders in shortest position with pad at axilla for more compression to firm area. Encouraged pt to continue with exercise at home and she is considering going back to the pool for exercise   03/11/2022 :   Began with cervical and upper thoracic ROM in sitting with shoulder ROM. Then, perfomed MLD to rught upper quadrant: short neck, superficial and deep abdominlas, left axillary nodes, anterior interaxillary anastamosis, right inguinal nodes, right axillo-inguinal anastamosis and lateral trunk, right upper and lower arm to hand with return along pathways, then to sidelying for posterior interaxillary anastamosis and more work on lateral trunk expecially at posterior axilla where she seems to have more congestions.  Followed up in supine with gentle pressure on fullness in axilla while doing more MLD on trunk. Finally, open book stretch with arm on chest and lower trunk rotation with arms in goal post for more active stretch to chest and lateral trunk   03/06/2022 Performed MFR on pts Right axillary, upper arm, antecubital foss and forearm with fewer cords felt.  In sitting; pt instructed in self MLD with PT reading instructions. short  neck,  5 breaths,Activated left axillary and right inguinal LN's, anterior interaxillary pathway, right axillo-inguinal pathway, outside of right arm, inside to outside, outer arm again then repeating pathways, then bilateral forearm repeating pathways and ending with LN's. Pt donned compression garments after treatment 03/04/2022 Discussed laundering of sleeve to tighten it up some Performed MFR on pts Right axillary, upper arm, antecubital foss and forearm with multiple cords felt. Educated in sigle arm chest to stretch cords with wrist extension. Performed MLD on Pts right UE after MFR; short neck,  5  breaths,Activated left axillary and right inguinal LN's, anterior interaxillary pathway, right axillo-inguinal pathway, outside of right arm, inside to outside, outer arm again then repeating pathways, then bilateral forearm repeating pathways and ending with LN's. Instructed pt in gentle nature of technique and gave pt written instructions.    02/28/2022 Re-evaluated pt, performed SOZO approx 1 month early which put pt in the red Lymphedema zone. Measured for garments and fit pt with class 1 size 3 Black Medi Harmony Sleeve and gauntlet. Showed pt how to don, and discussed wear time in day only and remove at night. Discussed benefit of learning MLD for the arm, and exercises for cording release    PATIENT EDUCATION:  Education details: reviewed MLD , encouraged pt to wear compression bra  Person educated: Patient Education method: Explanation   Education comprehension: verbalized understanding and returned demonstration    ASSESSMENT:  CLINICAL IMPRESSION:    Pt brought her husband to learn wrapping today. He did quite well after practicing but will benefit from some review.Swelling still present and noticeable at dorsum of hand and firmness through arm remains.  Pt will benefit from skilled therapeutic intervention to improve on the following deficits: Decreased knowledge of precautions, impaired UE functional use, pain, swelling,decreased ROM, postural dysfunction.   PT treatment/interventions: ADL/Self care home management, Therapeutic exercises, Therapeutic activity, Patient/Family education, Self Care, Orthotic/Fit training, DME instructions, Manual lymph drainage, Compression bandaging, scar mobilization, and Manual therapy     GOALS: Goals reviewed with patient? Yes  LONG TERM GOALS:  (STG=LTG)  GOALS Name Target Date  Goal status  1 Pt will demonstrate she has regained full shoulder ROM and function post operatively compared to baselines.  Baseline: 04/17/2022 MET  2 Pt  will report the discomfort from fullness in her chest and right axilla is decreased by 50% 04/17/2022 MET  3 Pt will be independent in self MLD to the Right UE and trunk to decrease swelling 04/11/2022 RE-eval  4 Pt will have no complaints of right UE tightness/tenderness from cording 04/11/2022 RE-EVAL  5 Pts. SOZO score will be reduced to be within the green zone 04/11/2022 RE-EVAL          PLAN: PT FREQUENCY/DURATION: 1-2x/week for  6 weeks   PLAN FOR NEXT SESSION: Teach husband wrapping, Continue MLD to the right UE, MFR to cording in axilla and arm, Review UE stretches/NTS to address cording, STM prnContinue SOZO screening. Consider a Tribute wrap    [employer]. Owens Shark, PT  Claris Pong, PT 03/20/2022, 12:58 PM

## 2022-03-21 ENCOUNTER — Inpatient Hospital Stay
Admission: RE | Admit: 2022-03-21 | Discharge: 2022-03-21 | Disposition: A | Discharge status: Home / Self Care | Payer: Self-pay | Source: Ambulatory Visit | Attending: Radiation Oncology | Admitting: Radiation Oncology

## 2022-03-25 ENCOUNTER — Ambulatory Visit: Payer: Medicare PPO

## 2022-03-26 ENCOUNTER — Ambulatory Visit: Payer: Medicare PPO | Admitting: Rehabilitation

## 2022-03-27 ENCOUNTER — Ambulatory Visit: Payer: Medicare PPO

## 2022-03-31 ENCOUNTER — Ambulatory Visit: Payer: Medicare PPO | Admitting: Physical Therapy

## 2022-03-31 ENCOUNTER — Encounter: Payer: Self-pay | Admitting: Physical Therapy

## 2022-03-31 DIAGNOSIS — R222 Localized swelling, mass and lump, trunk: Secondary | ICD-10-CM | POA: Diagnosis not present

## 2022-03-31 DIAGNOSIS — I890 Lymphedema, not elsewhere classified: Secondary | ICD-10-CM

## 2022-03-31 DIAGNOSIS — C50111 Malignant neoplasm of central portion of right female breast: Secondary | ICD-10-CM

## 2022-03-31 DIAGNOSIS — Z483 Aftercare following surgery for neoplasm: Secondary | ICD-10-CM | POA: Diagnosis not present

## 2022-03-31 DIAGNOSIS — R293 Abnormal posture: Secondary | ICD-10-CM | POA: Diagnosis not present

## 2022-03-31 DIAGNOSIS — Z170 Estrogen receptor positive status [ER+]: Secondary | ICD-10-CM | POA: Diagnosis not present

## 2022-03-31 NOTE — Therapy (Addendum)
OUTPATIENT PHYSICAL THERAPY BREAST CANCER TREATMENT   Patient Name: Christy Flores MRN: [id number] DOB:08-Mar-1951, 71 y.o., female Today's Date: 03/31/2022   PT End of Session - 03/31/22 1017     Visit Number 11    Number of Visits 16    Date for PT Re-Evaluation 04/11/22    Authorization Type [employer]    Authorization - Visit Number 6    Authorization - Number of Visits 12    PT Start Time 9923   pt running late today   PT Stop Time 1100    PT Time Calculation (min) 45 min    Activity Tolerance Patient tolerated treatment well    Behavior During Therapy WFL for tasks assessed/performed              Past Medical History:  Diagnosis Date   GERD (gastroesophageal reflux disease)    Senile osteoporosis    Sleep apnea    on CPAP   Past Surgical History:  Procedure Laterality Date   ABDOMINAL HYSTERECTOMY     AXILLARY LYMPH NODE BIOPSY Right 12/17/2021   Procedure: RIGHT AXILLARY SENTINEL LYMPH NODE BIOPSY;  Surgeon: Rolm Bookbinder, MD;  Location: [facility];  Service: General;  Laterality: Right;   BACK SURGERY     BREAST BIOPSY Right 11/27/2021   BREAST BIOPSY Right 11/06/2021   times 2   BREAST LUMPECTOMY WITH RADIOACTIVE SEED LOCALIZATION Right 12/17/2021   Procedure: RIGHT BREAST LUMPECTOMY WITH RADIOACTIVE SEED LOCALIZATION;  Surgeon: Rolm Bookbinder, MD;  Location: [facility];  Service: General;  Laterality: Right;  [place]   RADIOACTIVE SEED GUIDED EXCISIONAL BREAST BIOPSY Right 12/17/2021   Procedure: RIGHT BREAST SEED BRACKETED EXCISIONAL BIOPSY;  Surgeon: Rolm Bookbinder, MD;  Location: [facility];  Service: General;  Laterality: Right;   RHINOPLASTY     TUBAL LIGATION     Patient Active Problem List   Diagnosis Date Noted   Osteoporosis with pathological fracture 12/20/2021   Malignant neoplasm of upper-outer quadrant of right breast in female,  estrogen receptor positive ([place]) 11/12/2021    PCP: Asencion Noble, MD  REFERRING PROVIDER: Rolm Bookbinder, MD  REFERRING DIAG:  Right Breast Cancer  THERAPY DIAG:  Lymphedema, not elsewhere classified  Abnormal posture  Malignant neoplasm of central portion of right breast in female, estrogen receptor positive ([place])  Rationale for Evaluation and Treatment Rehabilitation  ONSET DATE: 11/08/2022  SUBJECTIVE:  SUBJECTIVE STATEMENT:    The bandaging seemed to make my hand worse. We tried to bandage it but it still didn't look right.   PERTINENT HISTORY:  Patient was diagnosed on 11/07/2021 with right Invasive Ductal Carcinoma grade 1. It measures .7  and . 3  cm and is located in the central quadrant. It is ER+, PR=+,Her 2- with a  low Ki67 .She has a sister with breast cancer in her 22s who she thinks may have some unknown genetic mutation. She has a maternal grandmother and 5 maternal aunts with breast cancer. She thinks her mother had ovarian cancer. She has a brother and sister with colon cancer. She has a brother and her father both having melanoma.  She had a right lumpectomy x 2 on 12/17/2021 with 0/1 lymph nodes removed.  Pt had difficulty with skin rash post op. She has radiation til 02/12/2022   PATIENT GOALS:  Reassess how my recovery is going related to arm function, pain, and swelling.  PAIN:  Are you having pain?  No  PRECAUTIONS: Recent Surgery, right UE Lymphedema risk current radiation   ACTIVITY LEVEL / LEISURE: pt has been doing her post op shoulder exercises  She has not been able to walk because it has been so hot but wants to get back to it as she normally is a "walker" 3- 5 miles a day    OBJECTIVE:    OBSERVATIONS:  Pt  has healed incisions on right breast and axilla with only  slight darkening of skin from radiation.  The swelling in her left lateral chest is much reduced. She does still have a firm area with well defined borders under the axillary incision but says that it is getting better   PALPATION: Small cords palpable in right antecubital area when she puts her arm a certain way POSTURE: forward head, rounded shoulders,   UPPER EXTREMITY AROM/PROM:   A/PROM RIGHT  11/12/2021   02/05/2022   Shoulder extension 65    Shoulder flexion 148 162   Shoulder abduction 170 175   Shoulder internal rotation 70    Shoulder external rotation 100                            (Blank rows = not tested)   A/PROM LEFT  11/12/2021  Shoulder extension 57  Shoulder flexion 150  Shoulder abduction 165  Shoulder internal rotation 70  Shoulder external rotation 85                          (Blank rows = not tested)   LYMPHEDEMA ASSESSMENTS:    LANDMARK RIGHT  11/12/2021 RIGHT 01/22/2022 02/28/2022 9/6/203 03/18/2022  10 cm proximal to olecranon process 32.3 33 33.2 33.2 33.2  Olecranon process 27._0 27._1 cm proximal to ulnar styloid process 23.7 24.3 24.0 23.5 25  Just proximal to ulnar styloid process 17.9 17.5 17.7 17.5 18.8  Across hand at thumb web space 19.9 20 19.9 20 20.3  At base of 2nd digit 6.9 7 6.8 6.8 6.9  (Blank rows = not tested)   LANDMARK LEFT  11/12/2021 02/28/2022  10 cm proximal to olecranon process 34.5 33.6  Olecranon process 27.5 27.5  10 cm proximal to ulnar styloid process 23.8 23.6  Just proximal to ulnar styloid process 17.3 17.8  Across hand at thumb web space 18.8 19.3  At base of 2nd  digit 6.7 6.9  (Blank rows = not tested)              TREATMENT TODAY: 03/31/2022 Pt arrived with sleeve and gauntlet donned. Educated pt that the gauntlet is the reason her fingers are swelling worse because the fluid is being pushed in to them.  Perfomed MLD to right upper extremity short neck,, left axillary nodes, anterior interaxillary  anastamosis, right inguinal nodes, right axillo-inguinal anastamosis and lateral trunk, right upper and lower arm to hand (extra time spent at hand where pt has increased edema) with return along pathways and ending with left axillary and right inguinal LN. Pt wrapped with medium TG soft: 1/2 grey foam added to dorsum of R hand, elastomull to fingers 1-5 and covered with paper tape, artiflex, 6 cm to hand, 10 cm wrist to axilla in herringbone pattern, and 12 cm wrist to axilla. Pt reported increased comfort with 1/2 grey foam on back of hand.   9/14/202 Pts wrap removed and was quite wet from showering. Cut new TG soft and supplied new 6 cm wrap. Pts husband was instructed in compression bandaging as follows;TG soft, finger wrap with elastomull fingers 1-5, Artiflex to axilla, 6 cm hand wrap, 10 cm wrap to axilla, and 12 cm wrap to axilla. Pts husband practiced all wraps x 1 with VC's and TC's required They were given self bandaging website, and also illustrated handouts for bandaging   03/18/2022 Pts arm visualized and noted  increased swelling throughout. Signed pt up for garments to be fit next week. Measured right UE with increased swelling noted. No complaints of significant arm pain or SOB. Perfomed MLD to right upper extremity short neck,, left axillary nodes, anterior interaxillary anastamosis, right inguinal nodes, right axillo-inguinal anastamosis and lateral trunk, right upper and lower arm to hand with return along pathways and ending with left axillary and right inguinal LN. Pt wrapped with medium TG soft: fingerwrap to fingers 1-5 with TG pulled over it, artiflex, 6 cm to hand, 10 cm wrist to axilla, and 12 cm wrist to axilla.  Showed how to check capillary refill and how to exercise to loosen wraps. Advised to remove 1 layer prn if too uncomfortable, and remove all if needed. Asked pt to have her husband come to learn wrapping. Gave her instructions on wearing, laundering  bandages.     03/12/2022:  Remeasured arm Began with cervical and upper thoracic ROM exercise with emphasis on side bending as well as forearm supination with shoulder ROM. Perfomed MLD to rught upper quadrant: short neck, superficial and deep abdominlas, left axillary nodes, anterior interaxillary anastamosis, right inguinal nodes, right axillo-inguinal anastamosis and lateral trunk, right upper and lower arm to hand with return along pathways, then to sidelying for posterior interaxillary anastamosis and more work on lateral trunk expecially at posterior. With pt in left sidelying with partial backward rotation with pillow along entire back with arm overhead, full area at axilla becomes more obvious.  Worked more on skin stretch around this area and instructed pt to place pressure on firm area while she does active circles with right arm overhead.  Finally, adjusted compression bra with hooks at shoulders in shortest position with pad at axilla for more compression to firm area. Encouraged pt to continue with exercise at home and she is considering going back to the pool for exercise   03/11/2022 :   Began with cervical and upper thoracic ROM in sitting with shoulder ROM. Then, perfomed MLD to rught upper  quadrant: short neck, superficial and deep abdominlas, left axillary nodes, anterior interaxillary anastamosis, right inguinal nodes, right axillo-inguinal anastamosis and lateral trunk, right upper and lower arm to hand with return along pathways, then to sidelying for posterior interaxillary anastamosis and more work on lateral trunk expecially at posterior axilla where she seems to have more congestions.  Followed up in supine with gentle pressure on fullness in axilla while doing more MLD on trunk. Finally, open book stretch with arm on chest and lower trunk rotation with arms in goal post for more active stretch to chest and lateral trunk   PATIENT EDUCATION:  Education details: reviewed MLD ,  encouraged pt to wear compression bra  Person educated: Patient Education method: Explanation   Education comprehension: verbalized understanding and returned demonstration    ASSESSMENT:  CLINICAL IMPRESSION:    Pt reports that her hand swelling was worse and got worse with the bandages intact. She reports the fingers may have become loose. She arrived wearing her sleeve with her gauntlet on. Educated pt to not wear the gauntlet because that pushes fluid in to her fingers. Performed MLD today and continued with compression bandaging but added 1/2 grey foam to dorsum of hand and placed paper tape around fingers so they did not snag. Pt interested in being measured for CircAid profile at next session.   Pt will benefit from skilled therapeutic intervention to improve on the following deficits: Decreased knowledge of precautions, impaired UE functional use, pain, swelling,decreased ROM, postural dysfunction.   PT treatment/interventions: ADL/Self care home management, Therapeutic exercises, Therapeutic activity, Patient/Family education, Self Care, Orthotic/Fit training, DME instructions, Manual lymph drainage, Compression bandaging, scar mobilization, and Manual therapy     GOALS: Goals reviewed with patient? Yes  LONG TERM GOALS:  (STG=LTG)  GOALS Name Target Date  Goal status  1 Pt will demonstrate she has regained full shoulder ROM and function post operatively compared to baselines.  Baseline: 04/28/2022 MET  2 Pt will report the discomfort from fullness in her chest and right axilla is decreased by 50% 04/28/2022 MET  3 Pt will be independent in self MLD to the Right UE and trunk to decrease swelling 04/11/2022 RE-eval  4 Pt will have no complaints of right UE tightness/tenderness from cording 04/11/2022 RE-EVAL  5 Pts. SOZO score will be reduced to be within the green zone 04/11/2022 RE-EVAL          PLAN: PT FREQUENCY/DURATION: 1-2x/week for  6 weeks   PLAN FOR NEXT  SESSION: Teach husband wrapping, Continue MLD to the right UE, MFR to cording in axilla and arm, Review UE stretches/NTS to address cording, STM prnContinue SOZO screening.measure for circaid profile and send to [employer], PT 03/31/2022, 1:11 PM

## 2022-04-01 DIAGNOSIS — H524 Presbyopia: Secondary | ICD-10-CM | POA: Diagnosis not present

## 2022-04-03 ENCOUNTER — Ambulatory Visit: Payer: Medicare PPO | Admitting: Physical Therapy

## 2022-04-03 ENCOUNTER — Encounter: Payer: Self-pay | Admitting: Physical Therapy

## 2022-04-03 DIAGNOSIS — Z170 Estrogen receptor positive status [ER+]: Secondary | ICD-10-CM | POA: Diagnosis not present

## 2022-04-03 DIAGNOSIS — R222 Localized swelling, mass and lump, trunk: Secondary | ICD-10-CM | POA: Diagnosis not present

## 2022-04-03 DIAGNOSIS — I890 Lymphedema, not elsewhere classified: Secondary | ICD-10-CM | POA: Diagnosis not present

## 2022-04-03 DIAGNOSIS — C50111 Malignant neoplasm of central portion of right female breast: Secondary | ICD-10-CM | POA: Diagnosis not present

## 2022-04-03 DIAGNOSIS — R293 Abnormal posture: Secondary | ICD-10-CM | POA: Diagnosis not present

## 2022-04-03 DIAGNOSIS — Z483 Aftercare following surgery for neoplasm: Secondary | ICD-10-CM | POA: Diagnosis not present

## 2022-04-03 NOTE — Therapy (Signed)
OUTPATIENT PHYSICAL THERAPY BREAST CANCER TREATMENT   Patient Name: Christy Flores MRN: [id number] DOB:1950/12/28, 71 y.o., female Today's Date: 04/03/2022   PT End of Session - 04/03/22 1205     Visit Number 12    Number of Visits 16    Date for PT Re-Evaluation 04/11/22    Authorization - Visit Number 9    Authorization - Number of Visits 12    PT Start Time 9432    PT Stop Time 1255    PT Time Calculation (min) 50 min    Activity Tolerance Patient tolerated treatment well    Behavior During Therapy WFL for tasks assessed/performed              Past Medical History:  Diagnosis Date   GERD (gastroesophageal reflux disease)    Senile osteoporosis    Sleep apnea    on CPAP   Past Surgical History:  Procedure Laterality Date   ABDOMINAL HYSTERECTOMY     AXILLARY LYMPH NODE BIOPSY Right 12/17/2021   Procedure: RIGHT AXILLARY SENTINEL LYMPH NODE BIOPSY;  Surgeon: Rolm Bookbinder, MD;  Location: [place];  Service: General;  Laterality: Right;   BACK SURGERY     BREAST BIOPSY Right 11/27/2021   BREAST BIOPSY Right 11/06/2021   times 2   BREAST LUMPECTOMY WITH RADIOACTIVE SEED LOCALIZATION Right 12/17/2021   Procedure: RIGHT BREAST LUMPECTOMY WITH RADIOACTIVE SEED LOCALIZATION;  Surgeon: Rolm Bookbinder, MD;  Location: [place];  Service: General;  Laterality: Right;  [place]   RADIOACTIVE SEED GUIDED EXCISIONAL BREAST BIOPSY Right 12/17/2021   Procedure: RIGHT BREAST SEED BRACKETED EXCISIONAL BIOPSY;  Surgeon: Rolm Bookbinder, MD;  Location: [place];  Service: General;  Laterality: Right;   RHINOPLASTY     TUBAL LIGATION     Patient Active Problem List   Diagnosis Date Noted   Osteoporosis with pathological fracture 12/20/2021   Malignant neoplasm of upper-outer quadrant of right breast in female, estrogen receptor positive ([place]) 11/12/2021     PCP: Asencion Noble, MD  REFERRING PROVIDER: Rolm Bookbinder, MD  REFERRING DIAG:  Right Breast Cancer  THERAPY DIAG:  Lymphedema, not elsewhere classified  Abnormal posture  Malignant neoplasm of central portion of right breast in female, estrogen receptor positive ([place])  Rationale for Evaluation and Treatment Rehabilitation  ONSET DATE: 11/08/2022  SUBJECTIVE:  SUBJECTIVE STATEMENT:    I had to take the bandages off and wash them and then I put them back on. The foam definitely helped at the back of the hand but I still had some swelling.   PERTINENT HISTORY:  Patient was diagnosed on 11/07/2021 with right Invasive Ductal Carcinoma grade 1. It measures .7  and . 3  cm and is located in the central quadrant. It is ER+, PR=+,Her 2- with a  low Ki67 .She has a sister with breast cancer in her 96s who she thinks may have some unknown genetic mutation. She has a maternal grandmother and 5 maternal aunts with breast cancer. She thinks her mother had ovarian cancer. She has a brother and sister with colon cancer. She has a brother and her father both having melanoma.  She had a right lumpectomy x 2 on 12/17/2021 with 0/1 lymph nodes removed.  Pt had difficulty with skin rash post op. She has radiation til 02/12/2022   PATIENT GOALS:  Reassess how my recovery is going related to arm function, pain, and swelling.  PAIN:  Are you having pain?  No  PRECAUTIONS: Recent Surgery, right UE Lymphedema risk current radiation   ACTIVITY LEVEL / LEISURE: pt has been doing her post op shoulder exercises  She has not been able to walk because it has been so hot but wants to get back to it as she normally is a "walker" 3- 5 miles a day    OBJECTIVE:    OBSERVATIONS:  Pt  has healed incisions on right breast and axilla  with only slight darkening of skin from radiation.  The swelling in her left lateral chest is much reduced. She does still have a firm area with well defined borders under the axillary incision but says that it is getting better   PALPATION: Small cords palpable in right antecubital area when she puts her arm a certain way POSTURE: forward head, rounded shoulders,   UPPER EXTREMITY AROM/PROM:   A/PROM RIGHT  11/12/2021   02/05/2022   Shoulder extension 65    Shoulder flexion 148 162   Shoulder abduction 170 175   Shoulder internal rotation 70    Shoulder external rotation 100                            (Blank rows = not tested)   A/PROM LEFT  11/12/2021  Shoulder extension 57  Shoulder flexion 150  Shoulder abduction 165  Shoulder internal rotation 70  Shoulder external rotation 85                          (Blank rows = not tested)   LYMPHEDEMA ASSESSMENTS:    [facility] RIGHT  11/12/2021 RIGHT 01/22/2022 02/28/2022 9/6/203 03/18/2022 04/03/22  10 cm proximal to olecranon process 32.3 33 33.2 33.2 33.2 31  Olecranon process 27.'2 27 28 ' 27.7 28 27.5  10 cm proximal to ulnar styloid process 23.7 24.3 24.0 23.'5 25 23  ' Just proximal to ulnar styloid process 17.9 17.5 17.7 17.5 18.8 18.1  Across hand at thumb web space 19.9 20 19.9 20 20.3 20.5  At base of 2nd digit 6.9 7 6.8 6.8 6.9 7  (Blank rows = not tested)   LANDMARK LEFT  11/12/2021 02/28/2022  10 cm proximal to olecranon process 34.5 33.6  Olecranon process 27.5 27.5  10 cm proximal to ulnar styloid process 23.8 23.6  Just  proximal to ulnar styloid process 17.3 17.8  Across hand at thumb web space 18.8 19.3  At base of 2nd digit 6.7 6.9  (Blank rows = not tested)              TREATMENT TODAY: 04/03/22 Measured pt for compression garments today. She fit in to a CircAid Profile size 3, a [employer] size II sleeve and size II Medi [employer]. Garment order was placed using [employer] for night time garment and glove  and pt purchased her sleeve. Educated pt that the sleeve and glove are to wear during day time and the profile is a night time garment. Remeasured circumferences and pt demonstrates a good decrease at forearm and upper arm today.  Pt wrapped with medium TG soft: 1/2 grey foam added to dorsum of R hand, elastomull to fingers 1-5 and covered with paper tape, artiflex, 6 cm to hand, 10 cm wrist to axilla in herringbone pattern, and 12 cm wrist to axilla.   03/31/2022 Pt arrived with sleeve and gauntlet donned. Educated pt that the gauntlet is the reason her fingers are swelling worse because the fluid is being pushed in to them.  Perfomed MLD to right upper extremity short neck,, left axillary nodes, anterior interaxillary anastamosis, right inguinal nodes, right axillo-inguinal anastamosis and lateral trunk, right upper and lower arm to hand (extra time spent at hand where pt has increased edema) with return along pathways and ending with left axillary and right inguinal LN. Pt wrapped with medium TG soft: 1/2 grey foam added to dorsum of R hand, elastomull to fingers 1-5 and covered with paper tape, artiflex, 6 cm to hand, 10 cm wrist to axilla in herringbone pattern, and 12 cm wrist to axilla. Pt reported increased comfort with 1/2 grey foam on back of hand.   9/14/202 Pts wrap removed and was quite wet from showering. Cut new TG soft and supplied new 6 cm wrap. Pts husband was instructed in compression bandaging as follows;TG soft, finger wrap with elastomull fingers 1-5, Artiflex to axilla, 6 cm hand wrap, 10 cm wrap to axilla, and 12 cm wrap to axilla. Pts husband practiced all wraps x 1 with VC's and TC's required They were given self bandaging website, and also illustrated handouts for bandaging   03/18/2022 Pts arm visualized and noted  increased swelling throughout. Signed pt up for garments to be fit next week. Measured right UE with increased swelling noted. No complaints of significant arm  pain or SOB. Perfomed MLD to right upper extremity short neck,, left axillary nodes, anterior interaxillary anastamosis, right inguinal nodes, right axillo-inguinal anastamosis and lateral trunk, right upper and lower arm to hand with return along pathways and ending with left axillary and right inguinal LN. Pt wrapped with medium TG soft: fingerwrap to fingers 1-5 with TG pulled over it, artiflex, 6 cm to hand, 10 cm wrist to axilla, and 12 cm wrist to axilla.  Showed how to check capillary refill and how to exercise to loosen wraps. Advised to remove 1 layer prn if too uncomfortable, and remove all if needed. Asked pt to have her husband come to learn wrapping. Gave her instructions on wearing, laundering bandages.     03/12/2022:  Remeasured arm Began with cervical and upper thoracic ROM exercise with emphasis on side bending as well as forearm supination with shoulder ROM. Perfomed MLD to rught upper quadrant: short neck, superficial and deep abdominlas, left axillary nodes, anterior interaxillary anastamosis, right inguinal nodes,  right axillo-inguinal anastamosis and lateral trunk, right upper and lower arm to hand with return along pathways, then to sidelying for posterior interaxillary anastamosis and more work on lateral trunk expecially at posterior. With pt in left sidelying with partial backward rotation with pillow along entire back with arm overhead, full area at axilla becomes more obvious.  Worked more on skin stretch around this area and instructed pt to place pressure on firm area while she does active circles with right arm overhead.  Finally, adjusted compression bra with hooks at shoulders in shortest position with pad at axilla for more compression to firm area. Encouraged pt to continue with exercise at home and she is considering going back to the pool for exercise   03/11/2022 :   Began with cervical and upper thoracic ROM in sitting with shoulder ROM. Then, perfomed MLD to rught upper  quadrant: short neck, superficial and deep abdominlas, left axillary nodes, anterior interaxillary anastamosis, right inguinal nodes, right axillo-inguinal anastamosis and lateral trunk, right upper and lower arm to hand with return along pathways, then to sidelying for posterior interaxillary anastamosis and more work on lateral trunk expecially at posterior axilla where she seems to have more congestions.  Followed up in supine with gentle pressure on fullness in axilla while doing more MLD on trunk. Finally, open book stretch with arm on chest and lower trunk rotation with arms in goal post for more active stretch to chest and lateral trunk   PATIENT EDUCATION:  Education details: reviewed MLD , encouraged pt to wear compression bra  Person educated: Patient Education method: Explanation   Education comprehension: verbalized understanding and returned demonstration    ASSESSMENT:  CLINICAL IMPRESSION:    Pt reports when she removed the TG soft she had an itchy rash that improved with hydrocortisone cream. She applied hydrocortisone prior to rebandaging at home. Her circumferential measurements have decreased since last session. She reports the hand swelling was not as bad with use of grey foam at dorsum. Pt was measured for day and night time garments. Will assess fit of garments once they arrive.   Pt will benefit from skilled therapeutic intervention to improve on the following deficits: Decreased knowledge of precautions, impaired UE functional use, pain, swelling,decreased ROM, postural dysfunction.   PT treatment/interventions: ADL/Self care home management, Therapeutic exercises, Therapeutic activity, Patient/Family education, Self Care, Orthotic/Fit training, DME instructions, Manual lymph drainage, Compression bandaging, scar mobilization, and Manual therapy     GOALS: Goals reviewed with patient? Yes  LONG TERM GOALS:  (STG=LTG)  GOALS Name Target Date  Goal status  1 Pt will  demonstrate she has regained full shoulder ROM and function post operatively compared to baselines.  Baseline: 05/01/2022 MET  2 Pt will report the discomfort from fullness in her chest and right axilla is decreased by 50% 05/01/2022 MET  3 Pt will be independent in self MLD to the Right UE and trunk to decrease swelling 04/11/2022 RE-eval  4 Pt will have no complaints of right UE tightness/tenderness from cording 04/11/2022 RE-EVAL  5 Pts. SOZO score will be reduced to be within the green zone 04/11/2022 RE-EVAL          PLAN: PT FREQUENCY/DURATION: 1-2x/week for  6 weeks   PLAN FOR NEXT SESSION: how are new garments? Teach husband wrapping, Continue MLD to the right UE, MFR to cording in axilla and arm, Review UE stretches/NTS to address cording, STM prnContinue SOZO screening    [employer], PT 04/03/2022, 12:57 PM

## 2022-04-08 ENCOUNTER — Ambulatory Visit: Payer: Medicare PPO | Attending: General Surgery | Admitting: Rehabilitation

## 2022-04-08 ENCOUNTER — Encounter: Payer: Self-pay | Admitting: Rehabilitation

## 2022-04-08 DIAGNOSIS — Z483 Aftercare following surgery for neoplasm: Secondary | ICD-10-CM | POA: Insufficient documentation

## 2022-04-08 DIAGNOSIS — R222 Localized swelling, mass and lump, trunk: Secondary | ICD-10-CM | POA: Insufficient documentation

## 2022-04-08 DIAGNOSIS — Z170 Estrogen receptor positive status [ER+]: Secondary | ICD-10-CM | POA: Diagnosis not present

## 2022-04-08 DIAGNOSIS — R293 Abnormal posture: Secondary | ICD-10-CM | POA: Insufficient documentation

## 2022-04-08 DIAGNOSIS — I890 Lymphedema, not elsewhere classified: Secondary | ICD-10-CM | POA: Insufficient documentation

## 2022-04-08 DIAGNOSIS — C50111 Malignant neoplasm of central portion of right female breast: Secondary | ICD-10-CM | POA: Diagnosis not present

## 2022-04-08 NOTE — Therapy (Signed)
OUTPATIENT PHYSICAL THERAPY BREAST CANCER TREATMENT   Patient Name: Christy Flores MRN: [id number] DOB:10/13/1950, 71 y.o., female Today's Date: 04/08/2022   PT End of Session - 04/08/22 0916     Visit Number 13    Number of Visits 16    Authorization Type 04/11/22    Authorization - Visit Number 10    Authorization - Number of Visits 12    PT Start Time 0920   late   PT Stop Time 1004    PT Time Calculation (min) 44 min    Activity Tolerance Patient tolerated treatment well    Behavior During Therapy WFL for tasks assessed/performed               Past Medical History:  Diagnosis Date   GERD (gastroesophageal reflux disease)    Senile osteoporosis    Sleep apnea    on CPAP   Past Surgical History:  Procedure Laterality Date   ABDOMINAL HYSTERECTOMY     AXILLARY LYMPH NODE BIOPSY Right 12/17/2021   Procedure: RIGHT AXILLARY SENTINEL LYMPH NODE BIOPSY;  Surgeon: Rolm Bookbinder, MD;  Location: [place];  Service: General;  Laterality: Right;   BACK SURGERY     BREAST BIOPSY Right 11/27/2021   BREAST BIOPSY Right 11/06/2021   times 2   BREAST LUMPECTOMY WITH RADIOACTIVE SEED LOCALIZATION Right 12/17/2021   Procedure: RIGHT BREAST LUMPECTOMY WITH RADIOACTIVE SEED LOCALIZATION;  Surgeon: Rolm Bookbinder, MD;  Location: [place];  Service: General;  Laterality: Right;  [place]   [place]   RADIOACTIVE SEED GUIDED EXCISIONAL BREAST BIOPSY Right 12/17/2021   Procedure: RIGHT BREAST SEED BRACKETED EXCISIONAL BIOPSY;  Surgeon: Rolm Bookbinder, MD;  Location: [place];  Service: General;  Laterality: Right;   RHINOPLASTY     TUBAL LIGATION     Patient Active Problem List   Diagnosis Date Noted   Osteoporosis with pathological fracture 12/20/2021   Malignant neoplasm of upper-outer quadrant of right breast in female, estrogen receptor positive ([place]) 11/12/2021     PCP: Asencion Noble, MD  REFERRING PROVIDER: Rolm Bookbinder, MD  REFERRING DIAG:  Right Breast Cancer  THERAPY DIAG:  Lymphedema, not elsewhere classified  Abnormal posture  Malignant neoplasm of central portion of right breast in female, estrogen receptor positive ([place])  Localized swelling, mass and lump, trunk  Aftercare following surgery for neoplasm  Rationale for Evaluation and Treatment Rehabilitation  ONSET DATE: 11/08/2022  SUBJECTIVE:  SUBJECTIVE STATEMENT:    I got my new garments. The glove seems to get puffy on the back of the hand  PERTINENT HISTORY:  Patient was diagnosed on 11/07/2021 with right Invasive Ductal Carcinoma grade 1. It measures .7  and . 3  cm and is located in the central quadrant. It is ER+, PR=+,Her 2- with a  low Ki67 .She has a sister with breast cancer in her 40s who she thinks may have some unknown genetic mutation. She has a maternal grandmother and 5 maternal aunts with breast cancer. She thinks her mother had ovarian cancer. She has a brother and sister with colon cancer. She has a brother and her father both having melanoma.  She had a right lumpectomy x 2 on 12/17/2021 with 0/1 lymph nodes removed.  Pt had difficulty with skin rash post op. She has radiation til 02/12/2022   PATIENT GOALS:  Reassess how my recovery is going related to arm function, pain, and swelling.  PAIN:  Are you having pain?  No  PRECAUTIONS: Recent Surgery, right UE Lymphedema risk current radiation   ACTIVITY LEVEL / LEISURE: pt has been doing her post op shoulder exercises  She has not been able to walk because it has been so hot but wants to get back to it as she normally is a "walker" 3- 5 miles a day    OBJECTIVE:    OBSERVATIONS:  Pt  has healed incisions on right breast and  axilla with only slight darkening of skin from radiation.  The swelling in her left lateral chest is much reduced. She does still have a firm area with well defined borders under the axillary incision but says that it is getting better   PALPATION: Small cords palpable in right antecubital area when she puts her arm a certain way POSTURE: forward head, rounded shoulders,   UPPER EXTREMITY AROM/PROM:   A/PROM RIGHT  11/12/2021   02/05/2022   Shoulder extension 65    Shoulder flexion 148 162   Shoulder abduction 170 175   Shoulder internal rotation 70    Shoulder external rotation 100                            (Blank rows = not tested)   A/PROM LEFT  11/12/2021  Shoulder extension 57  Shoulder flexion 150  Shoulder abduction 165  Shoulder internal rotation 70  Shoulder external rotation 85                          (Blank rows = not tested)   LYMPHEDEMA ASSESSMENTS:    [facility] RIGHT  11/12/2021 RIGHT 01/22/2022 02/28/2022 9/6/203 03/18/2022 04/03/22 04/08/22  10 cm proximal to olecranon process 32.3 33 33.2 33.2 33.2 31   Olecranon process 27.'2 27 28 ' 27.7 28 27.5   10 cm proximal to ulnar styloid process 23.7 24.3 24.0 23.'5 25 23   ' Just proximal to ulnar styloid process 17.9 17.5 17.7 17.5 18.8 18.1   Across hand at thumb web space 19.9 20 19.9 20 20.3 20.5   At base of 2nd digit 6.9 7 6.8 6.8 6.9 7   (Blank rows = not tested)   LANDMARK LEFT  11/12/2021 02/28/2022  10 cm proximal to olecranon process 34.5 33.6  Olecranon process 27.5 27.5  10 cm proximal to ulnar styloid process 23.8 23.6  Just proximal to ulnar styloid process 17.3 17.8  Across hand  at thumb web space 18.8 19.3  At base of 2nd digit 6.7 6.9  (Blank rows = not tested)      TREATMENT TODAY: 04/08/22 Redid SOZO today which is now at 22.3 "off the charts" which frustrated patient - seems to be in chronic lymphedema stage.  Made small lymph pad for in glove Perfomed MLD to right upper extremity short neck,, left axillary  nodes, anterior interaxillary anastamosis, right inguinal nodes, right axillo-inguinal anastamosis and lateral trunk, right upper and lower arm to hand (extra time spent at hand where pt has increased edema) with return along pathways and ending with left axillary and right inguinal LN. Pt left with sleeve and glove donned  04/03/22 Measured pt for compression garments today. She fit in to a CircAid Profile size 3, a [employer] size II sleeve and size II [employer]. Garment order was placed using [employer] for night time garment and glove and pt purchased her sleeve. Educated pt that the sleeve and glove are to wear during day time and the profile is a night time garment. Remeasured circumferences and pt demonstrates a good decrease at forearm and upper arm today.  Pt wrapped with medium TG soft: 1/2 grey foam added to dorsum of R hand, elastomull to fingers 1-5 and covered with paper tape, artiflex, 6 cm to hand, 10 cm wrist to axilla in herringbone pattern, and 12 cm wrist to axilla.   03/31/2022 Pt arrived with sleeve and gauntlet donned. Educated pt that the gauntlet is the reason her fingers are swelling worse because the fluid is being pushed in to them.  Perfomed MLD to right upper extremity short neck,, left axillary nodes, anterior interaxillary anastamosis, right inguinal nodes, right axillo-inguinal anastamosis and lateral trunk, right upper and lower arm to hand (extra time spent at hand where pt has increased edema) with return along pathways and ending with left axillary and right inguinal LN. Pt wrapped with medium TG soft: 1/2 grey foam added to dorsum of R hand, elastomull to fingers 1-5 and covered with paper tape, artiflex, 6 cm to hand, 10 cm wrist to axilla in herringbone pattern, and 12 cm wrist to axilla. Pt reported increased comfort with 1/2 grey foam on back of hand.   PATIENT EDUCATION:  Education details: reviewed MLD , encouraged pt to wear compression bra   Person educated: Patient Education method: Explanation   Education comprehension: verbalized understanding and returned demonstration   ASSESSMENT:  CLINICAL IMPRESSION:     Pts SOZO went up even higher despite bandaging.  Will continue visits to make sure new garments are working - watch back of hand.    Pt will benefit from skilled therapeutic intervention to improve on the following deficits: Decreased knowledge of precautions, impaired UE functional use, pain, swelling,decreased ROM, postural dysfunction.   PT treatment/interventions: ADL/Self care home management, Therapeutic exercises, Therapeutic activity, Patient/Family education, Self Care, Orthotic/Fit training, DME instructions, Manual lymph drainage, Compression bandaging, scar mobilization, and Manual therapy     GOALS: Goals reviewed with patient? Yes  LONG TERM GOALS:  (STG=LTG)  GOALS Name Target Date  Goal status  1 Pt will demonstrate she has regained full shoulder ROM and function post operatively compared to baselines.  Baseline: 05/06/2022 MET  2 Pt will report the discomfort from fullness in her chest and right axilla is decreased by 50% 05/06/2022 MET  3 Pt will be independent in self MLD to the Right UE and trunk to decrease swelling 04/11/2022 RE-eval  4 Pt will have no complaints of right UE tightness/tenderness from cording 04/11/2022 RE-EVAL  5 Pts. SOZO score will be reduced to be within the green zone 04/11/2022 RE-EVAL      PLAN: PT FREQUENCY/DURATION: 1-2x/week for  6 weeks   PLAN FOR NEXT SESSION: how are new garments with pad? Continue MLD to the right UE, MFR to cording in axilla and arm, Most likely does not need SOZO screening   Stark Bray, PT 04/08/2022, 11:41 AM

## 2022-04-10 ENCOUNTER — Ambulatory Visit: Payer: Medicare PPO | Admitting: Rehabilitation

## 2022-04-14 ENCOUNTER — Ambulatory Visit: Payer: Medicare PPO

## 2022-04-14 DIAGNOSIS — C50111 Malignant neoplasm of central portion of right female breast: Secondary | ICD-10-CM

## 2022-04-14 DIAGNOSIS — I890 Lymphedema, not elsewhere classified: Secondary | ICD-10-CM | POA: Diagnosis not present

## 2022-04-14 DIAGNOSIS — R293 Abnormal posture: Secondary | ICD-10-CM

## 2022-04-14 DIAGNOSIS — Z483 Aftercare following surgery for neoplasm: Secondary | ICD-10-CM | POA: Diagnosis not present

## 2022-04-14 DIAGNOSIS — Z170 Estrogen receptor positive status [ER+]: Secondary | ICD-10-CM | POA: Diagnosis not present

## 2022-04-14 DIAGNOSIS — R222 Localized swelling, mass and lump, trunk: Secondary | ICD-10-CM | POA: Diagnosis not present

## 2022-04-14 NOTE — Therapy (Signed)
OUTPATIENT PHYSICAL THERAPY BREAST CANCER TREATMENT   Patient Name: Christy Flores MRN: [id number] DOB:01-25-1951, 71 y.o., female Today's Date: 04/14/2022   PT End of Session - 04/14/22 0902     Visit Number 14    Number of Visits 22    Date for PT Re-Evaluation 05/12/22    PT Start Time 0903    PT Stop Time 1000    PT Time Calculation (min) 57 min    Activity Tolerance Patient tolerated treatment well    Behavior During Therapy WFL for tasks assessed/performed               Past Medical History:  Diagnosis Date   GERD (gastroesophageal reflux disease)    Senile osteoporosis    Sleep apnea    on CPAP   Past Surgical History:  Procedure Laterality Date   ABDOMINAL HYSTERECTOMY     AXILLARY LYMPH NODE BIOPSY Right 12/17/2021   Procedure: RIGHT AXILLARY SENTINEL LYMPH NODE BIOPSY;  Surgeon: Rolm Bookbinder, MD;  Location: [place];  Service: General;  Laterality: Right;   BACK SURGERY     BREAST BIOPSY Right 11/27/2021   BREAST BIOPSY Right 11/06/2021   times 2   BREAST LUMPECTOMY WITH RADIOACTIVE SEED LOCALIZATION Right 12/17/2021   Procedure: RIGHT BREAST LUMPECTOMY WITH RADIOACTIVE SEED LOCALIZATION;  Surgeon: Rolm Bookbinder, MD;  Location: [place];  Service: General;  Laterality: Right;  [place]   lasik   RADIOACTIVE SEED GUIDED EXCISIONAL BREAST BIOPSY Right 12/17/2021   Procedure: RIGHT BREAST SEED BRACKETED EXCISIONAL BIOPSY;  Surgeon: Rolm Bookbinder, MD;  Location: [place];  Service: General;  Laterality: Right;   RHINOPLASTY     TUBAL LIGATION     Patient Active Problem List   Diagnosis Date Noted   Osteoporosis with pathological fracture 12/20/2021   Malignant neoplasm of upper-outer quadrant of right breast in female, estrogen receptor positive ([place]) 11/12/2021    PCP: Asencion Noble, MD  REFERRING PROVIDER: Rolm Bookbinder, MD  REFERRING  DIAG:  Right Breast Cancer  THERAPY DIAG:  Lymphedema, not elsewhere classified  Abnormal posture  Malignant neoplasm of central portion of right breast in female, estrogen receptor positive ([place])  Localized swelling, mass and lump, trunk  Aftercare following surgery for neoplasm  Rationale for Evaluation and Treatment Rehabilitation  ONSET DATE: 11/08/2022  SUBJECTIVE:  SUBJECTIVE STATEMENT:     My arm is doing well but my hand is still puffy. I think the pad helps but it isn't big enough.The night garment does well, but makes my fingers still swell. I do my MLD every am and PM and I feel comfortable with it. My night Circaid is working very well for my arm, its just my fingers and knuckles that swell.   PERTINENT HISTORY:  Patient was diagnosed on 11/07/2021 with right Invasive Ductal Carcinoma grade 1. It measures .7  and . 3  cm and is located in the central quadrant. It is ER+, PR=+,Her 2- with a  low Ki67 .She has a sister with breast cancer in her 53s who she thinks may have some unknown genetic mutation. She has a maternal grandmother and 5 maternal aunts with breast cancer. She thinks her mother had ovarian cancer. She has a brother and sister with colon cancer. She has a brother and her father both having melanoma.  She had a right lumpectomy x 2 on 12/17/2021 with 0/1 lymph nodes removed.  Pt had difficulty with skin rash post op. She has radiation til 02/12/2022   PATIENT GOALS:  Reassess how my recovery is going related to arm function, pain, and swelling.  PAIN:  Are you having pain?  No  PRECAUTIONS: Recent Surgery, right UE Lymphedema risk current radiation   ACTIVITY LEVEL / LEISURE: pt has been doing her post op shoulder exercises  She has not been able to walk because it has been so hot  but wants to get back to it as she normally is a "walker" 3- 5 miles a day    OBJECTIVE:    OBSERVATIONS:  Pt  has healed incisions on right breast and axilla with only slight darkening of skin from radiation.  The swelling in her left lateral chest is much reduced. She does still have a firm area with well defined borders under the axillary incision but says that it is getting better   PALPATION: Small cords palpable in right antecubital area when she puts her arm a certain way POSTURE: forward head, rounded shoulders,   UPPER EXTREMITY AROM/PROM:   A/PROM RIGHT  11/12/2021   02/05/2022   Shoulder extension 65    Shoulder flexion 148 162   Shoulder abduction 170 175   Shoulder internal rotation 70    Shoulder external rotation 100                            (Blank rows = not tested)   A/PROM LEFT  11/12/2021  Shoulder extension 57  Shoulder flexion 150  Shoulder abduction 165  Shoulder internal rotation 70  Shoulder external rotation 85                          (Blank rows = not tested)   LYMPHEDEMA ASSESSMENTS:    [facility] RIGHT  11/12/2021 RIGHT 01/22/2022 02/28/2022 9/6/203 03/18/2022 04/03/22 04/14/22  10 cm proximal to olecranon process 32.3 33 33.2 33.2 33._0 Olecranon process 27._1 27.7 28 27.5 26.5  10 cm proximal to ulnar styloid process 23.7 24.3 24.0 23._2 23.4  Just proximal to ulnar styloid process 17.9 17.5 17.7 17.5 18.8 18.1 18.1  Across hand at thumb web space 19.9 20 19.9 20 20.3 20.5 20.6  At base of 2nd digit 6.9 7 6.8 6.8 6._3 (  Blank rows = not tested)   LANDMARK LEFT  11/12/2021 02/28/2022  10 cm proximal to olecranon process 34.5 33.6  Olecranon process 27.5 27.5  10 cm proximal to ulnar styloid process 23.8 23.6  Just proximal to ulnar styloid process 17.3 17.8  Across hand at thumb web space 18.8 19.3  At base of 2nd digit 6.7 6.9  (Blank rows = not tested)      TREATMENT TODAY:  04/14/2022 Pts garments removed and pt measured.  Bogginess noted at posterior elbow. Pt continues with finger swelling. Therapist demonstrated finger wrap and had pt also do. She will try at night while wearing her night garment. New larger peach foam pad made for dorsum of hand/thumb area to place in glove Perfomed MLD to right upper extremity short neck,, left axillary nodes, anterior interaxillary anastamosis, right inguinal nodes, right axillo-inguinal anastamosis and lateral trunk, right upper and lower arm to hand (extra time spent at hand where pt has increased edema) with return along pathways and ending with left axillary and right inguinal LN. Pt left with sleeve and glove donned Discussed Flexi touch and pt is agreeable to have benefits checked.  04/08/22 Redid SOZO today which is now at 22.3 "off the charts" which frustrated patient - seems to be in chronic lymphedema stage.  Made small lymph pad for in glove Perfomed MLD to right upper extremity short neck,, left axillary nodes, anterior interaxillary anastamosis, right inguinal nodes, right axillo-inguinal anastamosis and lateral trunk, right upper and lower arm to hand (extra time spent at hand where pt has increased edema) with return along pathways and ending with left axillary and right inguinal LN. Pt left with sleeve and glove donned  04/03/22 Measured pt for compression garments today. She fit in to a CircAid Profile size 3, a [employer] size II sleeve and size II Medi [employer]. Garment order was placed using [employer] for night time garment and glove and pt purchased her sleeve. Educated pt that the sleeve and glove are to wear during day time and the profile is a night time garment. Remeasured circumferences and pt demonstrates a good decrease at forearm and upper arm today.  Pt wrapped with medium TG soft: 1/2 grey foam added to dorsum of R hand, elastomull to fingers 1-5 and covered with paper tape, artiflex, 6 cm to hand, 10 cm wrist to axilla in herringbone  pattern, and 12 cm wrist to axilla.   03/31/2022 Pt arrived with sleeve and gauntlet donned. Educated pt that the gauntlet is the reason her fingers are swelling worse because the fluid is being pushed in to them.  Perfomed MLD to right upper extremity short neck,, left axillary nodes, anterior interaxillary anastamosis, right inguinal nodes, right axillo-inguinal anastamosis and lateral trunk, right upper and lower arm to hand (extra time spent at hand where pt has increased edema) with return along pathways and ending with left axillary and right inguinal LN. Pt wrapped with medium TG soft: 1/2 grey foam added to dorsum of R hand, elastomull to fingers 1-5 and covered with paper tape, artiflex, 6 cm to hand, 10 cm wrist to axilla in herringbone pattern, and 12 cm wrist to axilla. Pt reported increased comfort with 1/2 grey foam on back of hand.   PATIENT EDUCATION:  Education details: reviewed MLD , encouraged pt to wear compression bra  Person educated: Patient Education method: Explanation   Education comprehension: verbalized understanding and returned demonstration   ASSESSMENT:  CLINICAL IMPRESSION:  Back of hand and fingers continue with increased swelling. Reviewed finger wrapping with pt to try at night with her Circaid and made a larger peach foam pad to wear in her glove. Bogginess noted at posterior elbow today and visible swelling above wrist but glove was rolled at top possibly causing increased swelling at wrist.     Pt will benefit from skilled therapeutic intervention to improve on the following deficits: Decreased knowledge of precautions, impaired UE functional use, pain, swelling,decreased ROM, postural dysfunction.   PT treatment/interventions: ADL/Self care home management, Therapeutic exercises, Therapeutic activity, Patient/Family education, Self Care, Orthotic/Fit training, DME instructions, Manual lymph drainage, Compression bandaging, scar mobilization, and Manual  therapy     GOALS: Goals reviewed with patient? Yes  LONG TERM GOALS:  (STG=LTG)  GOALS Name Target Date  Goal status  1 Pt will demonstrate she has regained full shoulder ROM and function post operatively compared to baselines.  Baseline: 05/06/2022 MET  2 Pt will report the discomfort from fullness in her chest and right axilla is decreased by 50% 05/06/2022 MET  3 Pt will be independent in self MLD to the Right UE and trunk to decrease swelling 04/14/2022 MET  4 Pt will have no complaints of right UE tightness/tenderness from cording 04/14/2022 MET  5 Pts. SOZO score will be reduced to be within the green zone 05/12/2022 Increased last visit Carry over  6 Pt will note decreased swelling in dorsum of hand with peach foam pad for glove 05/12/2022 RE-eval      PLAN: PT FREQUENCY/DURATION: 1-2x/week for  6 weeks   PLAN FOR NEXT SESSION: how is larger pad for glove? Continue MLD to the right UE, MFR to cording in axilla and arm, Most likely does not need SOZO screening but do 1 more time before DC, Sent demo to [facility] [facility] (04/14/2022)   Claris Pong, PT 04/14/2022, 10:08 AM

## 2022-04-17 ENCOUNTER — Ambulatory Visit: Payer: Medicare PPO

## 2022-04-17 DIAGNOSIS — R293 Abnormal posture: Secondary | ICD-10-CM

## 2022-04-17 DIAGNOSIS — C50111 Malignant neoplasm of central portion of right female breast: Secondary | ICD-10-CM | POA: Diagnosis not present

## 2022-04-17 DIAGNOSIS — I890 Lymphedema, not elsewhere classified: Secondary | ICD-10-CM | POA: Diagnosis not present

## 2022-04-17 DIAGNOSIS — R222 Localized swelling, mass and lump, trunk: Secondary | ICD-10-CM

## 2022-04-17 DIAGNOSIS — Z170 Estrogen receptor positive status [ER+]: Secondary | ICD-10-CM | POA: Diagnosis not present

## 2022-04-17 DIAGNOSIS — Z483 Aftercare following surgery for neoplasm: Secondary | ICD-10-CM | POA: Diagnosis not present

## 2022-04-17 NOTE — Therapy (Signed)
OUTPATIENT PHYSICAL THERAPY BREAST CANCER TREATMENT   Patient Name: Christy Flores MRN: [id number] DOB:01-Apr-1951, 71 y.o., female Today's Date: 04/17/2022   PT End of Session - 04/17/22 1204     Visit Number 15    Number of Visits 22    Date for PT Re-Evaluation 05/12/22    Authorization Type 04/11/22    Authorization Time Period 20 thru 05/09/2022    Authorization - Visit Number 15    Authorization - Number of Visits 20    Progress Note Due on Visit 3.67    PT Start Time 1205    PT Stop Time 1251    PT Time Calculation (min) 46 min    Activity Tolerance Patient tolerated treatment well    Behavior During Therapy WFL for tasks assessed/performed               Past Medical History:  Diagnosis Date   GERD (gastroesophageal reflux disease)    Senile osteoporosis    Sleep apnea    on CPAP   Past Surgical History:  Procedure Laterality Date   ABDOMINAL HYSTERECTOMY     AXILLARY LYMPH NODE BIOPSY Right 12/17/2021   Procedure: RIGHT AXILLARY SENTINEL LYMPH NODE BIOPSY;  Surgeon: Rolm Bookbinder, MD;  Location: [place];  Service: General;  Laterality: Right;   BACK SURGERY     BREAST BIOPSY Right 11/27/2021   BREAST BIOPSY Right 11/06/2021   times 2   BREAST LUMPECTOMY WITH RADIOACTIVE SEED LOCALIZATION Right 12/17/2021   Procedure: RIGHT BREAST LUMPECTOMY WITH RADIOACTIVE SEED LOCALIZATION;  Surgeon: Rolm Bookbinder, MD;  Location: [place];  Service: General;  Laterality: Right;  [place]   RADIOACTIVE SEED GUIDED EXCISIONAL BREAST BIOPSY Right 12/17/2021   Procedure: RIGHT BREAST SEED BRACKETED EXCISIONAL BIOPSY;  Surgeon: Rolm Bookbinder, MD;  Location: [place];  Service: General;  Laterality: Right;   RHINOPLASTY     TUBAL LIGATION     Patient Active Problem List   Diagnosis Date Noted   Osteoporosis with pathological fracture 12/20/2021    Malignant neoplasm of upper-outer quadrant of right breast in female, estrogen receptor positive ([place]) 11/12/2021    PCP: Asencion Noble, MD  REFERRING PROVIDER: Rolm Bookbinder, MD  REFERRING DIAG:  Right Breast Cancer  THERAPY DIAG:  Lymphedema, not elsewhere classified  Abnormal posture  Malignant neoplasm of central portion of right breast in female, estrogen receptor positive ([place])  Localized swelling, mass and lump, trunk  Aftercare following surgery for neoplasm  Rationale for Evaluation and Treatment Rehabilitation  ONSET DATE: 11/08/2022  SUBJECTIVE:  SUBJECTIVE STATEMENT:    I can't remember if I wrapped my fingers at night.  The bigger pad is helping. When I take the night garment off my hand and arm looks really good. I am still doing the MLD.   PERTINENT HISTORY:  Patient was diagnosed on 11/07/2021 with right Invasive Ductal Carcinoma grade 1. It measures .7  and . 3  cm and is located in the central quadrant. It is ER+, PR=+,Her 2- with a  low Ki67 .She has a sister with breast cancer in her 34s who she thinks may have some unknown genetic mutation. She has a maternal grandmother and 5 maternal aunts with breast cancer. She thinks her mother had ovarian cancer. She has a brother and sister with colon cancer. She has a brother and her father both having melanoma.  She had a right lumpectomy x 2 on 12/17/2021 with 0/1 lymph nodes removed.  Pt had difficulty with skin rash post op. She has radiation til 02/12/2022   PATIENT GOALS:  Reassess how my recovery is going related to arm function, pain, and swelling.  PAIN:  Are you having pain?  No  PRECAUTIONS: Recent Surgery, right UE Lymphedema risk current radiation   ACTIVITY LEVEL / LEISURE: pt has been doing her post op shoulder exercises   She has not been able to walk because it has been so hot but wants to get back to it as she normally is a "walker" 3- 5 miles a day    OBJECTIVE:    OBSERVATIONS:  Pt  has healed incisions on right breast and axilla with only slight darkening of skin from radiation.  The swelling in her left lateral chest is much reduced. She does still have a firm area with well defined borders under the axillary incision but says that it is getting better   PALPATION: Small cords palpable in right antecubital area when she puts her arm a certain way POSTURE: forward head, rounded shoulders,   UPPER EXTREMITY AROM/PROM:   A/PROM RIGHT  11/12/2021   02/05/2022   Shoulder extension 65    Shoulder flexion 148 162   Shoulder abduction 170 175   Shoulder internal rotation 70    Shoulder external rotation 100                            (Blank rows = not tested)   A/PROM LEFT  11/12/2021  Shoulder extension 57  Shoulder flexion 150  Shoulder abduction 165  Shoulder internal rotation 70  Shoulder external rotation 85                          (Blank rows = not tested)   LYMPHEDEMA ASSESSMENTS:    [facility] RIGHT  11/12/2021 RIGHT 01/22/2022 02/28/2022 9/6/203 03/18/2022 04/03/22 04/14/22  10 cm proximal to olecranon process 32.3 33 33.2 33.2 33.'2 31 31  ' Olecranon process 27.'2 27 28 ' 27.7 28 27.5 26.5  10 cm proximal to ulnar styloid process 23.7 24.3 24.0 23.'5 25 23 ' 23.4  Just proximal to ulnar styloid process 17.9 17.5 17.7 17.5 18.8 18.1 18.1  Across hand at thumb web space 19.9 20 19.9 20 20.3 20.5 20.6  At base of 2nd digit 6.9 7 6.8 6.8 6.'9 7 7  ' (Blank rows = not tested)   LANDMARK LEFT  11/12/2021 02/28/2022  10 cm proximal to olecranon process 34.5 33.6  Olecranon process 27.5 27.5  10  cm proximal to ulnar styloid process 23.8 23.6  Just proximal to ulnar styloid process 17.3 17.8  Across hand at thumb web space 18.8 19.3  At base of 2nd digit 6.7 6.9  (Blank rows = not tested)      TREATMENT  TODAY:  04/17/2022 Pts garments removed. Larger pad staying in place a dorsum of hand, but hand is still swollen Performed MLD to right upper extremity short neck,, left axillary nodes, anterior interaxillary anastamosis, right inguinal nodes, right axillo-inguinal anastamosis and lateral trunk, right upper and lower arm to hand (extra time spent at hand where pt has increased edema) with return along pathways and ending with left axillary and right inguinal LN. Reviewed sequence with pt verbally while performing. She has not heard from [employer] yet about [employer]. Surveyor, quantity again today. 04/14/2022 Pts garments removed and pt measured. Bogginess noted at posterior elbow. Pt continues with finger swelling. Therapist demonstrated finger wrap and had pt also do. She will try at night while wearing her night garment. New larger peach foam pad made for dorsum of hand/thumb area to place in glove Perfomed MLD to right upper extremity short neck,, left axillary nodes, anterior interaxillary anastamosis, right inguinal nodes, right axillo-inguinal anastamosis and lateral trunk, right upper and lower arm to hand (extra time spent at hand where pt has increased edema) with return along pathways and ending with left axillary and right inguinal LN. Pt left with sleeve and glove donned Discussed Flexi touch and pt is agreeable to have benefits checked.  04/08/22 Redid SOZO today which is now at 22.3 "off the charts" which frustrated patient - seems to be in chronic lymphedema stage.  Made small lymph pad for in glove Perfomed MLD to right upper extremity short neck,, left axillary nodes, anterior interaxillary anastamosis, right inguinal nodes, right axillo-inguinal anastamosis and lateral trunk, right upper and lower arm to hand (extra time spent at hand where pt has increased edema) with return along pathways and ending with left axillary and right inguinal LN. Pt left with sleeve and glove  donned  04/03/22 Measured pt for compression garments today. She fit in to a CircAid Profile size 3, a [employer] size II sleeve and size II Medi [employer]. Garment order was placed using [employer] for night time garment and glove and pt purchased her sleeve. Educated pt that the sleeve and glove are to wear during day time and the profile is a night time garment. Remeasured circumferences and pt demonstrates a good decrease at forearm and upper arm today.  Pt wrapped with medium TG soft: 1/2 grey foam added to dorsum of R hand, elastomull to fingers 1-5 and covered with paper tape, artiflex, 6 cm to hand, 10 cm wrist to axilla in herringbone pattern, and 12 cm wrist to axilla.   03/31/2022 Pt arrived with sleeve and gauntlet donned. Educated pt that the gauntlet is the reason her fingers are swelling worse because the fluid is being pushed in to them.  Perfomed MLD to right upper extremity short neck,, left axillary nodes, anterior interaxillary anastamosis, right inguinal nodes, right axillo-inguinal anastamosis and lateral trunk, right upper and lower arm to hand (extra time spent at hand where pt has increased edema) with return along pathways and ending with left axillary and right inguinal LN. Pt wrapped with medium TG soft: 1/2 grey foam added to dorsum of R hand, elastomull to fingers 1-5 and covered with paper tape, artiflex, 6 cm to hand, 10 cm  wrist to axilla in herringbone pattern, and 12 cm wrist to axilla. Pt reported increased comfort with 1/2 grey foam on back of hand.   PATIENT EDUCATION:  Education details: reviewed MLD , encouraged pt to wear compression bra  Person educated: Patient Education method: Explanation   Education comprehension: verbalized understanding and returned demonstration   ASSESSMENT:  CLINICAL IMPRESSION:     Arm is improving but veins are still less visible and dorsum of hand although improved with the peach foam is still swollen at dorsum of  hand. Pt is compliant with MLD at home, but made a few modifications today   Pt will benefit from skilled therapeutic intervention to improve on the following deficits: Decreased knowledge of precautions, impaired UE functional use, pain, swelling,decreased ROM, postural dysfunction.   PT treatment/interventions: ADL/Self care home management, Therapeutic exercises, Therapeutic activity, Patient/Family education, Self Care, Orthotic/Fit training, DME instructions, Manual lymph drainage, Compression bandaging, scar mobilization, and Manual therapy     GOALS: Goals reviewed with patient? Yes  LONG TERM GOALS:  (STG=LTG)  GOALS Name Target Date  Goal status  1 Pt will demonstrate she has regained full shoulder ROM and function post operatively compared to baselines.  Baseline: 05/06/2022 MET  2 Pt will report the discomfort from fullness in her chest and right axilla is decreased by 50% 05/06/2022 MET  3 Pt will be independent in self MLD to the Right UE and trunk to decrease swelling 04/14/2022 MET  4 Pt will have no complaints of right UE tightness/tenderness from cording 04/14/2022 MET  5 Pts. SOZO score will be reduced to be within the green zone 05/12/2022 Increased last visit Carry over  6 Pt will note decreased swelling in dorsum of hand with peach foam pad for glove 05/12/2022 RE-eval      PLAN: PT FREQUENCY/DURATION: 1-2x/week for  6 weeks   PLAN FOR NEXT SESSION: how is larger pad for glove? Continue MLD to the right UE, MFR to cording in axilla and arm, Most likely does not need SOZO screening but do 1 more time before DC, Sent demo to [facility] (04/14/2022)   Claris Pong, PT 04/17/2022, 12:57 PM

## 2022-04-22 ENCOUNTER — Ambulatory Visit: Payer: Medicare PPO

## 2022-04-22 DIAGNOSIS — C50111 Malignant neoplasm of central portion of right female breast: Secondary | ICD-10-CM

## 2022-04-22 DIAGNOSIS — R293 Abnormal posture: Secondary | ICD-10-CM | POA: Diagnosis not present

## 2022-04-22 DIAGNOSIS — Z170 Estrogen receptor positive status [ER+]: Secondary | ICD-10-CM | POA: Diagnosis not present

## 2022-04-22 DIAGNOSIS — R222 Localized swelling, mass and lump, trunk: Secondary | ICD-10-CM | POA: Diagnosis not present

## 2022-04-22 DIAGNOSIS — I890 Lymphedema, not elsewhere classified: Secondary | ICD-10-CM | POA: Diagnosis not present

## 2022-04-22 DIAGNOSIS — Z483 Aftercare following surgery for neoplasm: Secondary | ICD-10-CM

## 2022-04-22 NOTE — Therapy (Signed)
OUTPATIENT PHYSICAL THERAPY BREAST CANCER TREATMENT   Patient Name: Christy Flores MRN: [id number] DOB:07/16/1950, 71 y.o., female Today's Date: 04/22/2022   PT End of Session - 04/22/22 1000     Visit Number 16    Number of Visits 22    Date for PT Re-Evaluation 05/12/22    Authorization - Visit Number 16    Authorization - Number of Visits 20    PT Start Time 1001    PT Stop Time 1100    PT Time Calculation (min) 59 min    Activity Tolerance Patient tolerated treatment well    Behavior During Therapy WFL for tasks assessed/performed               Past Medical History:  Diagnosis Date   GERD (gastroesophageal reflux disease)    Senile osteoporosis    Sleep apnea    on CPAP   Past Surgical History:  Procedure Laterality Date   ABDOMINAL HYSTERECTOMY     AXILLARY LYMPH NODE BIOPSY Right 12/17/2021   Procedure: RIGHT AXILLARY SENTINEL LYMPH NODE BIOPSY;  Surgeon: Rolm Bookbinder, MD;  Location: [place];  Service: General;  Laterality: Right;   BACK SURGERY     BREAST BIOPSY Right 11/27/2021   BREAST BIOPSY Right 11/06/2021   times 2   BREAST LUMPECTOMY WITH RADIOACTIVE SEED LOCALIZATION Right 12/17/2021   Procedure: RIGHT BREAST LUMPECTOMY WITH RADIOACTIVE SEED LOCALIZATION;  Surgeon: Rolm Bookbinder, MD;  Location: [place];  Service: General;  Laterality: Right;  [place]   lasik   RADIOACTIVE SEED GUIDED EXCISIONAL BREAST BIOPSY Right 12/17/2021   Procedure: RIGHT BREAST SEED BRACKETED EXCISIONAL BIOPSY;  Surgeon: Rolm Bookbinder, MD;  Location: [place];  Service: General;  Laterality: Right;   RHINOPLASTY     TUBAL LIGATION     Patient Active Problem List   Diagnosis Date Noted   Osteoporosis with pathological fracture 12/20/2021   Malignant neoplasm of upper-outer quadrant of right breast in female, estrogen receptor positive ([place]) 11/12/2021     PCP: Asencion Noble, MD  REFERRING PROVIDER: Rolm Bookbinder, MD  REFERRING DIAG:  Right Breast Cancer  THERAPY DIAG:  Lymphedema, not elsewhere classified  Abnormal posture  Malignant neoplasm of central portion of right breast in female, estrogen receptor positive ([place])  Localized swelling, mass and lump, trunk  Aftercare following surgery for neoplasm  Rationale for Evaluation and Treatment Rehabilitation  ONSET DATE: 11/08/2022  SUBJECTIVE:  SUBJECTIVE STATEMENT:    At MD all day with her husband yesterday. No surgery but some other options they are working on. My hand lookds the best in the am after I wear the night garment. Hand still gets puffy with glove.   PERTINENT HISTORY:  Patient was diagnosed on 11/07/2021 with right Invasive Ductal Carcinoma grade 1. It measures .7  and . 3  cm and is located in the central quadrant. It is ER+, PR=+,Her 2- with a  low Ki67 .She has a sister with breast cancer in her 53s who she thinks may have some unknown genetic mutation. She has a maternal grandmother and 5 maternal aunts with breast cancer. She thinks her mother had ovarian cancer. She has a brother and sister with colon cancer. She has a brother and her father both having melanoma.  She had a right lumpectomy x 2 on 12/17/2021 with 0/1 lymph nodes removed.  Pt had difficulty with skin rash post op. She has radiation til 02/12/2022   PATIENT GOALS:  Reassess how my recovery is going related to arm function, pain, and swelling.  PAIN:  Are you having pain?  No  PRECAUTIONS: Recent Surgery, right UE Lymphedema risk current radiation   ACTIVITY LEVEL / LEISURE: pt has been doing her post op shoulder exercises  She has not been able to walk because it has been so hot but wants to get back to it as she  normally is a "walker" 3- 5 miles a day    OBJECTIVE:    OBSERVATIONS:  Pt  has healed incisions on right breast and axilla with only slight darkening of skin from radiation.  The swelling in her left lateral chest is much reduced. She does still have a firm area with well defined borders under the axillary incision but says that it is getting better   PALPATION: Small cords palpable in right antecubital area when she puts her arm a certain way POSTURE: forward head, rounded shoulders,   UPPER EXTREMITY AROM/PROM:   A/PROM RIGHT  11/12/2021   02/05/2022   Shoulder extension 65    Shoulder flexion 148 162   Shoulder abduction 170 175   Shoulder internal rotation 70    Shoulder external rotation 100                            (Blank rows = not tested)   A/PROM LEFT  11/12/2021  Shoulder extension 57  Shoulder flexion 150  Shoulder abduction 165  Shoulder internal rotation 70  Shoulder external rotation 85                          (Blank rows = not tested)   LYMPHEDEMA ASSESSMENTS:    [facility] RIGHT  11/12/2021 RIGHT 01/22/2022 02/28/2022 9/6/203 03/18/2022 04/03/22 04/14/22 04/22/2022  10 cm proximal to olecranon process 32.3 33 33.2 33.2 33.'2 31 31 ' 31.7  Olecranon process 27.'2 27 28 ' 27.7 28 27.5 26.5 26.6  10 cm proximal to ulnar styloid process 23.7 24.3 24.0 23.'5 25 23 ' 23.4 23.4  Just proximal to ulnar styloid process 17.9 17.5 17.7 17.5 18.8 18.1 18.1 18.5  Across hand at thumb web space 19.9 20 19.9 20 20.3 20.5 20.6 21  At base of 2nd digit 6.9 7 6.8 6.8 6.'9 7 7 ' 7.1  (Blank rows = not tested)   [facility] LEFT  11/12/2021 02/28/2022 04/22/2022  10 cm proximal to olecranon  process 34.5 33.6 33.0  Olecranon process 27.5 27.5 27.3  10 cm proximal to ulnar styloid process 23.8 23.6 23.6  Just proximal to ulnar styloid process 17.3 17.8 17.2  Across hand at thumb web space 18.8 19.3 19.0  At base of 2nd digit 6.7 6.9 7.0  (Blank rows = not tested)      TREATMENT  TODAY: 04/22/2022 Pts garments removed. Larger pad staying in place a dorsum of hand, but hand is still swollen Pt. Measured bilaterally In sitting:Pt performed  MLD to right upper extremity short neck,,5 breaths, left axillary nodes, anterior interaxillary anastamosis, right inguinal nodes, right axillo-inguinal anastamosis and  right upper and lower arm to hand  with return along pathways and ending with left axillary and right inguinal LN. Pt required multiple VC's throughout MLD for sequence/pressure  04/17/2022 Pts garments removed. Larger pad staying in place a dorsum of hand, but hand is still swollen Performed MLD to right upper extremity short neck,, left axillary nodes, anterior interaxillary anastamosis, right inguinal nodes, right axillo-inguinal anastamosis and lateral trunk, right upper and lower arm to hand (extra time spent at hand where pt has increased edema) with return along pathways and ending with left axillary and right inguinal LN. Reviewed sequence with pt verbally while performing. She has not heard from [employer] yet about Flexitouch. Surveyor, quantity again today. 04/14/2022 Pts garments removed and pt measured. Bogginess noted at posterior elbow. Pt continues with finger swelling. Therapist demonstrated finger wrap and had pt also do. She will try at night while wearing her night garment. New larger peach foam pad made for dorsum of hand/thumb area to place in glove Perfomed MLD to right upper extremity short neck,, left axillary nodes, anterior interaxillary anastamosis, right inguinal nodes, right axillo-inguinal anastamosis and lateral trunk, right upper and lower arm to hand (extra time spent at hand where pt has increased edema) with return along pathways and ending with left axillary and right inguinal LN. Pt left with sleeve and glove donned Discussed Flexi touch and pt is agreeable to have benefits checked.  04/08/22 Redid SOZO today which is now at 22.3 "off the  charts" which frustrated patient - seems to be in chronic lymphedema stage.  Made small lymph pad for in glove Perfomed MLD to right upper extremity short neck,, left axillary nodes, anterior interaxillary anastamosis, right inguinal nodes, right axillo-inguinal anastamosis and lateral trunk, right upper and lower arm to hand (extra time spent at hand where pt has increased edema) with return along pathways and ending with left axillary and right inguinal LN. Pt left with sleeve and glove donned  04/03/22 Measured pt for compression garments today. She fit in to a CircAid Profile size 3, a [employer] size II sleeve and size II Medi [employer]. Garment order was placed using [employer] for night time garment and glove and pt purchased her sleeve. Educated pt that the sleeve and glove are to wear during day time and the profile is a night time garment. Remeasured circumferences and pt demonstrates a good decrease at forearm and upper arm today.  Pt wrapped with medium TG soft: 1/2 grey foam added to dorsum of R hand, elastomull to fingers 1-5 and covered with paper tape, artiflex, 6 cm to hand, 10 cm wrist to axilla in herringbone pattern, and 12 cm wrist to axilla.   03/31/2022 Pt arrived with sleeve and gauntlet donned. Educated pt that the gauntlet is the reason her fingers are swelling worse because the  fluid is being pushed in to them.  Perfomed MLD to right upper extremity short neck,, left axillary nodes, anterior interaxillary anastamosis, right inguinal nodes, right axillo-inguinal anastamosis and lateral trunk, right upper and lower arm to hand (extra time spent at hand where pt has increased edema) with return along pathways and ending with left axillary and right inguinal LN. Pt wrapped with medium TG soft: 1/2 grey foam added to dorsum of R hand, elastomull to fingers 1-5 and covered with paper tape, artiflex, 6 cm to hand, 10 cm wrist to axilla in herringbone pattern, and 12 cm  wrist to axilla. Pt reported increased comfort with 1/2 grey foam on back of hand.   PATIENT EDUCATION:  Education details: reviewed MLD , encouraged pt to wear compression bra  Person educated: Patient Education method: Explanation   Education comprehension: verbalized understanding and returned demonstration   ASSESSMENT:  CLINICAL IMPRESSION:     Spent most of session with pt seated and practicing self MLD.Pt needed multiple VCs for proper technique and intermittent cues for sequence She was not opening pathways before initiating the arm and performing strokes too close together and with heavy hand rather than light stretch and with cueing to slow down. She improved with practice. Hand/wrist still swollen with glove and larger pad.  Pt will benefit from skilled therapeutic intervention to improve on the following deficits: Decreased knowledge of precautions, impaired UE functional use, pain, swelling,decreased ROM, postural dysfunction.   PT treatment/interventions: ADL/Self care home management, Therapeutic exercises, Therapeutic activity, Patient/Family education, Self Care, Orthotic/Fit training, DME instructions, Manual lymph drainage, Compression bandaging, scar mobilization, and Manual therapy     GOALS: Goals reviewed with patient? Yes  LONG TERM GOALS:  (STG=LTG)  GOALS Name Target Date  Goal status  1 Pt will demonstrate she has regained full shoulder ROM and function post operatively compared to baselines.  Baseline: 05/06/2022 MET  2 Pt will report the discomfort from fullness in her chest and right axilla is decreased by 50% 05/06/2022 MET  3 Pt will be independent in self MLD to the Right UE and trunk to decrease swelling 04/14/2022 MET  4 Pt will have no complaints of right UE tightness/tenderness from cording 04/14/2022 MET  5 Pts. SOZO score will be reduced to be within the green zone 05/12/2022 Increased last visit Carry over  6 Pt will note decreased swelling in  dorsum of hand with peach foam pad for glove 05/12/2022 RE-eval      PLAN: PT FREQUENCY/DURATION: 1-2x/week for  6 weeks   PLAN FOR NEXT SESSION: how is larger pad for glove? Continue MLD to the right UE, MFR to cording in axilla and arm, Most likely does not need SOZO screening but do 1 more time before DC, Sent demo to [employer] (04/14/2022)   Claris Pong, PT 04/22/2022, 11:02 AM

## 2022-04-24 ENCOUNTER — Ambulatory Visit: Payer: Medicare PPO

## 2022-04-28 ENCOUNTER — Ambulatory Visit: Payer: Medicare PPO | Admitting: Physical Therapy

## 2022-04-29 ENCOUNTER — Other Ambulatory Visit: Payer: Self-pay

## 2022-05-01 ENCOUNTER — Encounter: Payer: Self-pay | Admitting: Physical Therapy

## 2022-05-01 ENCOUNTER — Ambulatory Visit: Payer: Medicare PPO | Admitting: Physical Therapy

## 2022-05-01 DIAGNOSIS — Z483 Aftercare following surgery for neoplasm: Secondary | ICD-10-CM | POA: Diagnosis not present

## 2022-05-01 DIAGNOSIS — R222 Localized swelling, mass and lump, trunk: Secondary | ICD-10-CM | POA: Diagnosis not present

## 2022-05-01 DIAGNOSIS — I890 Lymphedema, not elsewhere classified: Secondary | ICD-10-CM

## 2022-05-01 DIAGNOSIS — R293 Abnormal posture: Secondary | ICD-10-CM | POA: Diagnosis not present

## 2022-05-01 DIAGNOSIS — Z170 Estrogen receptor positive status [ER+]: Secondary | ICD-10-CM

## 2022-05-01 DIAGNOSIS — C50111 Malignant neoplasm of central portion of right female breast: Secondary | ICD-10-CM | POA: Diagnosis not present

## 2022-05-01 NOTE — Therapy (Signed)
OUTPATIENT PHYSICAL THERAPY BREAST CANCER TREATMENT   Patient Name: Christy Flores MRN: [id number] DOB:[birth year], 71 y.o., female Today's Date: 05/01/2022   PT End of Session - 05/01/22 1207     Visit Number 17    Number of Visits 22    Date for PT Re-Evaluation 05/12/22    PT Start Time 1205    PT Stop Time 6314    PT Time Calculation (min) 53 min    Activity Tolerance Patient tolerated treatment well    Behavior During Therapy WFL for tasks assessed/performed               Past Medical History:  Diagnosis Date   GERD (gastroesophageal reflux disease)    Senile osteoporosis    Sleep apnea    on CPAP   Past Surgical History:  Procedure Laterality Date   ABDOMINAL HYSTERECTOMY     AXILLARY LYMPH NODE BIOPSY Right 12/17/2021   Procedure: RIGHT AXILLARY SENTINEL LYMPH NODE BIOPSY;  Surgeon: Rolm Bookbinder, MD;  Location: [place];  Service: General;  Laterality: Right;   BACK SURGERY     BREAST BIOPSY Right 11/27/2021   BREAST BIOPSY Right 11/06/2021   times 2   BREAST LUMPECTOMY WITH RADIOACTIVE SEED LOCALIZATION Right 12/17/2021   Procedure: RIGHT BREAST LUMPECTOMY WITH RADIOACTIVE SEED LOCALIZATION;  Surgeon: Rolm Bookbinder, MD;  Location: [place];  Service: General;  Laterality: Right;  [place]   RADIOACTIVE SEED GUIDED EXCISIONAL BREAST BIOPSY Right 12/17/2021   Procedure: RIGHT BREAST SEED BRACKETED EXCISIONAL BIOPSY;  Surgeon: Rolm Bookbinder, MD;  Location: [place];  Service: General;  Laterality: Right;   RHINOPLASTY     TUBAL LIGATION     Patient Active Problem List   Diagnosis Date Noted   Osteoporosis with pathological fracture 12/20/2021   Malignant neoplasm of upper-outer quadrant of right breast in female, estrogen receptor positive ([place]) 11/12/2021    PCP: Asencion Noble, MD  REFERRING PROVIDER: Rolm Bookbinder,  MD  REFERRING DIAG:  Right Breast Cancer  THERAPY DIAG:  Lymphedema, not elsewhere classified  Abnormal posture  Malignant neoplasm of central portion of right breast in female, estrogen receptor positive ([place])  Rationale for Evaluation and Treatment Rehabilitation  ONSET DATE: 11/08/2022  SUBJECTIVE:                                                                                                                                                                                           SUBJECTIVE STATEMENT:    I had the trial  of the basic pump and it did not do anything. My hand stays swollen.    PERTINENT HISTORY:  Patient was diagnosed on 11/07/2021 with right Invasive Ductal Carcinoma grade 1. It measures .7  and . 3  cm and is located in the central quadrant. It is ER+, PR=+,Her 2- with a  low Ki67 .She has a sister with breast cancer in her 9s who she thinks may have some unknown genetic mutation. She has a maternal grandmother and 5 maternal aunts with breast cancer. She thinks her mother had ovarian cancer. She has a brother and sister with colon cancer. She has a brother and her father both having melanoma.  She had a right lumpectomy x 2 on 12/17/2021 with 0/1 lymph nodes removed.  Pt had difficulty with skin rash post op. She has radiation til 02/12/2022   PATIENT GOALS:  Reassess how my recovery is going related to arm function, pain, and swelling.  PAIN:  Are you having pain?  No  PRECAUTIONS: Recent Surgery, right UE Lymphedema risk current radiation   ACTIVITY LEVEL / LEISURE: pt has been doing her post op shoulder exercises  She has not been able to walk because it has been so hot but wants to get back to it as she normally is a "walker" 3- 5 miles a day    OBJECTIVE:    OBSERVATIONS:  Pt  has healed incisions on right breast and axilla with only slight darkening of skin from radiation.  The swelling in her left lateral chest is much reduced. She does still have a firm  area with well defined borders under the axillary incision but says that it is getting better   PALPATION: Small cords palpable in right antecubital area when she puts her arm a certain way POSTURE: forward head, rounded shoulders,   UPPER EXTREMITY AROM/PROM:   A/PROM RIGHT  11/12/2021   02/05/2022   Shoulder extension 65    Shoulder flexion 148 162   Shoulder abduction 170 175   Shoulder internal rotation 70    Shoulder external rotation 100                            (Blank rows = not tested)   A/PROM LEFT  11/12/2021  Shoulder extension 57  Shoulder flexion 150  Shoulder abduction 165  Shoulder internal rotation 70  Shoulder external rotation 85                          (Blank rows = not tested)   LYMPHEDEMA ASSESSMENTS:    [facility] RIGHT  11/12/2021 RIGHT 01/22/2022 02/28/2022 9/6/203 03/18/2022 04/03/22 04/14/22 04/22/2022  10 cm proximal to olecranon process 32.3 33 33.2 33.2 33.'2 31 31 ' 31.7  Olecranon process 27.'2 27 28 ' 27.7 28 27.5 26.5 26.6  10 cm proximal to ulnar styloid process 23.7 24.3 24.0 23.'5 25 23 ' 23.4 23.4  Just proximal to ulnar styloid process 17.9 17.5 17.7 17.5 18.8 18.1 18.1 18.5  Across hand at thumb web space 19.9 20 19.9 20 20.3 20.5 20.6 21  At base of 2nd digit 6.9 7 6.8 6.8 6.'9 7 7 ' 7.1  (Blank rows = not tested)   [facility] LEFT  11/12/2021 02/28/2022 04/22/2022  10 cm proximal to olecranon process 34.5 33.6 33.0  Olecranon process 27.5 27.5 27.3  10 cm proximal to ulnar styloid process 23.8 23.6 23.6  Just proximal to ulnar styloid process  17.3 17.8 17.2  Across hand at thumb web space 18.8 19.3 19.0  At base of 2nd digit 6.7 6.9 7.0  (Blank rows = not tested)      TREATMENT TODAY: 05/01/22 Pts garments removed. Larger pad staying in place a dorsum of hand, but hand is still swollen Performed MLD to right upper extremity short neck,, left axillary nodes, anterior interaxillary anastamosis, right inguinal nodes, right axillo-inguinal anastamosis and  lateral trunk, right upper and lower arm to hand (extra time spent at hand where pt has increased edema) with return along pathways and ending with left axillary and right inguinal LN. Created a foam bracelet for pt to wear under end of sleeve to help reduce hand swelling 04/22/2022 Pts garments removed. Larger pad staying in place a dorsum of hand, but hand is still swollen Pt. Measured bilaterally In sitting:Pt performed  MLD to right upper extremity short neck,,5 breaths, left axillary nodes, anterior interaxillary anastamosis, right inguinal nodes, right axillo-inguinal anastamosis and  right upper and lower arm to hand  with return along pathways and ending with left axillary and right inguinal LN. Pt required multiple VC's throughout MLD for sequence/pressure  04/17/2022 Pts garments removed. Larger pad staying in place a dorsum of hand, but hand is still swollen Performed MLD to right upper extremity short neck,, left axillary nodes, anterior interaxillary anastamosis, right inguinal nodes, right axillo-inguinal anastamosis and lateral trunk, right upper and lower arm to hand (extra time spent at hand where pt has increased edema) with return along pathways and ending with left axillary and right inguinal LN. Reviewed sequence with pt verbally while performing. She has not heard from [employer] yet about Flexitouch. Surveyor, quantity again today. 04/14/2022 Pts garments removed and pt measured. Bogginess noted at posterior elbow. Pt continues with finger swelling. Therapist demonstrated finger wrap and had pt also do. She will try at night while wearing her night garment. New larger peach foam pad made for dorsum of hand/thumb area to place in glove Perfomed MLD to right upper extremity short neck,, left axillary nodes, anterior interaxillary anastamosis, right inguinal nodes, right axillo-inguinal anastamosis and lateral trunk, right upper and lower arm to hand (extra time spent at hand where pt  has increased edema) with return along pathways and ending with left axillary and right inguinal LN. Pt left with sleeve and glove donned Discussed Flexi touch and pt is agreeable to have benefits checked.  04/08/22 Redid SOZO today which is now at 22.3 "off the charts" which frustrated patient - seems to be in chronic lymphedema stage.  Made small lymph pad for in glove Perfomed MLD to right upper extremity short neck,, left axillary nodes, anterior interaxillary anastamosis, right inguinal nodes, right axillo-inguinal anastamosis and lateral trunk, right upper and lower arm to hand (extra time spent at hand where pt has increased edema) with return along pathways and ending with left axillary and right inguinal LN. Pt left with sleeve and glove donned  04/03/22 Measured pt for compression garments today. She fit in to a CircAid Profile size 3, a [employer] size II sleeve and size II Medi [employer]. Garment order was placed using [employer] for night time garment and glove and pt purchased her sleeve. Educated pt that the sleeve and glove are to wear during day time and the profile is a night time garment. Remeasured circumferences and pt demonstrates a good decrease at forearm and upper arm today.  Pt wrapped with medium TG soft: 1/2 grey  foam added to dorsum of R hand, elastomull to fingers 1-5 and covered with paper tape, artiflex, 6 cm to hand, 10 cm wrist to axilla in herringbone pattern, and 12 cm wrist to axilla.   03/31/2022 Pt arrived with sleeve and gauntlet donned. Educated pt that the gauntlet is the reason her fingers are swelling worse because the fluid is being pushed in to them.  Perfomed MLD to right upper extremity short neck,, left axillary nodes, anterior interaxillary anastamosis, right inguinal nodes, right axillo-inguinal anastamosis and lateral trunk, right upper and lower arm to hand (extra time spent at hand where pt has increased edema) with return along  pathways and ending with left axillary and right inguinal LN. Pt wrapped with medium TG soft: 1/2 grey foam added to dorsum of R hand, elastomull to fingers 1-5 and covered with paper tape, artiflex, 6 cm to hand, 10 cm wrist to axilla in herringbone pattern, and 12 cm wrist to axilla. Pt reported increased comfort with 1/2 grey foam on back of hand.   PATIENT EDUCATION:  Education details: reviewed MLD , encouraged pt to wear compression bra  Person educated: Patient Education method: Explanation   Education comprehension: verbalized understanding and returned demonstration   ASSESSMENT:  CLINICAL IMPRESSION:    Pt reports that her hand is still swollen but the larger pad is staying in place. Created foam bracelet for pt to wear under end of glove to see if this would help. She may benefit from a longer sleeve so there is more overlap between sleeve and glove to help decrease hand swelling. Continued with MLD today. She also reached out to her insurance company who told her that the [place] would be covered with a note from her doctor.   Pt will benefit from skilled therapeutic intervention to improve on the following deficits: Decreased knowledge of precautions, impaired UE functional use, pain, swelling,decreased ROM, postural dysfunction.   PT treatment/interventions: ADL/Self care home management, Therapeutic exercises, Therapeutic activity, Patient/Family education, Self Care, Orthotic/Fit training, DME instructions, Manual lymph drainage, Compression bandaging, scar mobilization, and Manual therapy     GOALS: Goals reviewed with patient? Yes  LONG TERM GOALS:  (STG=LTG)  GOALS Name Target Date  Goal status  1 Pt will demonstrate she has regained full shoulder ROM and function post operatively compared to baselines.  Baseline: 05/06/2022 MET  2 Pt will report the discomfort from fullness in her chest and right axilla is decreased by 50% 05/06/2022 MET  3 Pt will be independent in  self MLD to the Right UE and trunk to decrease swelling 04/14/2022 MET  4 Pt will have no complaints of right UE tightness/tenderness from cording 04/14/2022 MET  5 Pts. SOZO score will be reduced to be within the green zone 05/12/2022 Increased last visit Carry over  6 Pt will note decreased swelling in dorsum of hand with peach foam pad for glove 05/12/2022 RE-eval      PLAN: PT FREQUENCY/DURATION: 1-2x/week for  6 weeks   PLAN FOR NEXT SESSION: how is foam bracelet? Continue MLD to the right UE, MFR to cording in axilla and arm, Most likely does not need SOZO screening but do 1 more time before DC, Sent demo to [employer] (04/14/2022)   Allyson Sabal [place], PT 05/01/2022, 1:04 PM

## 2022-05-02 ENCOUNTER — Telehealth: Payer: Self-pay

## 2022-05-02 NOTE — Telephone Encounter (Signed)
Rn called pt to update on her plan for prior authorization for insurance for flexi touch pump. Rn instructed pt to follow up with Dr. Cristal Generous office if she has not heard anything soon from the company. She was very appreciate of the assistance in this manner.

## 2022-05-05 ENCOUNTER — Ambulatory Visit: Payer: Medicare PPO | Admitting: Physical Therapy

## 2022-05-05 ENCOUNTER — Encounter: Payer: Self-pay | Admitting: Physical Therapy

## 2022-05-05 DIAGNOSIS — Z170 Estrogen receptor positive status [ER+]: Secondary | ICD-10-CM

## 2022-05-05 DIAGNOSIS — R293 Abnormal posture: Secondary | ICD-10-CM | POA: Diagnosis not present

## 2022-05-05 DIAGNOSIS — C50111 Malignant neoplasm of central portion of right female breast: Secondary | ICD-10-CM | POA: Diagnosis not present

## 2022-05-05 DIAGNOSIS — R222 Localized swelling, mass and lump, trunk: Secondary | ICD-10-CM | POA: Diagnosis not present

## 2022-05-05 DIAGNOSIS — I890 Lymphedema, not elsewhere classified: Secondary | ICD-10-CM | POA: Diagnosis not present

## 2022-05-05 DIAGNOSIS — Z483 Aftercare following surgery for neoplasm: Secondary | ICD-10-CM | POA: Diagnosis not present

## 2022-05-05 NOTE — Therapy (Signed)
OUTPATIENT PHYSICAL THERAPY BREAST CANCER TREATMENT   Patient Name: Christy Flores MRN: [id number] DOB:Apr 14, 1951, 71 y.o., female Today's Date: 05/05/2022   PT End of Session - 05/05/22 1010     Visit Number 18    Number of Visits 22    Date for PT Re-Evaluation 05/12/22    Authorization Type 04/11/22    Authorization Time Period 20 thru 05/09/2022    Authorization - Visit Number 42    Authorization - Number of Visits 20    PT Start Time 1009    PT Stop Time 1100    PT Time Calculation (min) 51 min    Activity Tolerance Patient tolerated treatment well    Behavior During Therapy WFL for tasks assessed/performed               Past Medical History:  Diagnosis Date   GERD (gastroesophageal reflux disease)    Senile osteoporosis    Sleep apnea    on CPAP   Past Surgical History:  Procedure Laterality Date   ABDOMINAL HYSTERECTOMY     AXILLARY LYMPH NODE BIOPSY Right 12/17/2021   Procedure: RIGHT AXILLARY SENTINEL LYMPH NODE BIOPSY;  Surgeon: Rolm Bookbinder, MD;  Location: [facility];  Service: General;  Laterality: Right;   BACK SURGERY     BREAST BIOPSY Right 11/27/2021   BREAST BIOPSY Right 11/06/2021   times 2   BREAST LUMPECTOMY WITH RADIOACTIVE SEED LOCALIZATION Right 12/17/2021   Procedure: RIGHT BREAST LUMPECTOMY WITH RADIOACTIVE SEED LOCALIZATION;  Surgeon: Rolm Bookbinder, MD;  Location: [place];  Service: General;  Laterality: Right;  [place]   RADIOACTIVE SEED GUIDED EXCISIONAL BREAST BIOPSY Right 12/17/2021   Procedure: RIGHT BREAST SEED BRACKETED EXCISIONAL BIOPSY;  Surgeon: Rolm Bookbinder, MD;  Location: [place];  Service: General;  Laterality: Right;   RHINOPLASTY     TUBAL LIGATION     Patient Active Problem List   Diagnosis Date Noted   Osteoporosis with pathological fracture 12/20/2021   Malignant neoplasm of upper-outer  quadrant of right breast in female, estrogen receptor positive ([place]) 11/12/2021    PCP: Asencion Noble, MD  REFERRING PROVIDER: Rolm Bookbinder, MD  REFERRING DIAG:  Right Breast Cancer  THERAPY DIAG:  Lymphedema, not elsewhere classified  Abnormal posture  Malignant neoplasm of central portion of right breast in female, estrogen receptor positive ([place])  Rationale for Evaluation and Treatment Rehabilitation  ONSET DATE: 11/08/2022  SUBJECTIVE:  SUBJECTIVE STATEMENT:    I think the foam bracelet really helped.    PERTINENT HISTORY:  Patient was diagnosed on 11/07/2021 with right Invasive Ductal Carcinoma grade 1. It measures .7  and . 3  cm and is located in the central quadrant. It is ER+, PR=+,Her 2- with a  low Ki67 .She has a sister with breast cancer in her 36s who she thinks may have some unknown genetic mutation. She has a maternal grandmother and 5 maternal aunts with breast cancer. She thinks her mother had ovarian cancer. She has a brother and sister with colon cancer. She has a brother and her father both having melanoma.  She had a right lumpectomy x 2 on 12/17/2021 with 0/1 lymph nodes removed.  Pt had difficulty with skin rash post op. She has radiation til 02/12/2022   PATIENT GOALS:  Reassess how my recovery is going related to arm function, pain, and swelling.  PAIN:  Are you having pain?  No  PRECAUTIONS: Recent Surgery, right UE Lymphedema risk current radiation   ACTIVITY LEVEL / LEISURE: pt has been doing her post op shoulder exercises  She has not been able to walk because it has been so hot but wants to get back to it as she normally is a "walker" 3- 5 miles a day    OBJECTIVE:    OBSERVATIONS:  Pt  has healed incisions on right breast and axilla with only slight darkening of  skin from radiation.  The swelling in her left lateral chest is much reduced. She does still have a firm area with well defined borders under the axillary incision but says that it is getting better   PALPATION: Small cords palpable in right antecubital area when she puts her arm a certain way POSTURE: forward head, rounded shoulders,   UPPER EXTREMITY AROM/PROM:   A/PROM RIGHT  11/12/2021   02/05/2022   Shoulder extension 65    Shoulder flexion 148 162   Shoulder abduction 170 175   Shoulder internal rotation 70    Shoulder external rotation 100                            (Blank rows = not tested)   A/PROM LEFT  11/12/2021  Shoulder extension 57  Shoulder flexion 150  Shoulder abduction 165  Shoulder internal rotation 70  Shoulder external rotation 85                          (Blank rows = not tested)   LYMPHEDEMA ASSESSMENTS:    [facility] RIGHT  11/12/2021 RIGHT 01/22/2022 02/28/2022 9/6/203 03/18/2022 04/03/22 04/14/22 04/22/2022 05/05/22  10 cm proximal to olecranon process 32.3 33 33.2 33.2 33._0 31.7 30.5  Olecranon process 27._1 27.7 28 27.5 26.5 26.6 26.5  10 cm proximal to ulnar styloid process 23.7 24.3 24.0 23._2 23.4 23.4 23  Just proximal to ulnar styloid process 17.9 17.5 17.7 17.5 18.8 18.1 18.1 18.5 18  Across hand at thumb web space 19.9 20 19.9 20 20.3 20.5 20.6 21 20.5  At base of 2nd digit 6.9 7 6.8 6.8 6._3 7.1 7  (Blank rows = not tested)   [facility] LEFT  11/12/2021 02/28/2022 04/22/2022  10 cm proximal to olecranon process 34.5 33.6 33.0  Olecranon process 27.5 27.5 27.3  10 cm proximal to ulnar styloid process 23.8 23.6 23.6  Just  proximal to ulnar styloid process 17.3 17.8 17.2  Across hand at thumb web space 18.8 19.3 19.0  At base of 2nd digit 6.7 6.9 7.0  (Blank rows = not tested)      TREATMENT TODAY: 05/06/22 Remeasured circumferences and pt's arm and hand have decreased throughout. Performed MLD to right upper extremity short neck,, left  axillary nodes, anterior interaxillary anastamosis, right inguinal nodes, right axillo-inguinal anastamosis and lateral trunk, right upper and lower arm to hand (extra time spent at hand where pt has increased edema) with return along pathways and ending with left axillary and right inguinal LN. Assisted pt with redonning foam bracelet under edge of sleeve and pt place 1/2 grey foam at dorsum of hand in glove.   05/01/22 Pts garments removed. Larger pad staying in place a dorsum of hand, but hand is still swollen Performed MLD to right upper extremity short neck,, left axillary nodes, anterior interaxillary anastamosis, right inguinal nodes, right axillo-inguinal anastamosis and lateral trunk, right upper and lower arm to hand (extra time spent at hand where pt has increased edema) with return along pathways and ending with left axillary and right inguinal LN. Created a foam bracelet for pt to wear under end of sleeve to help reduce hand swelling 04/22/2022 Pts garments removed. Larger pad staying in place a dorsum of hand, but hand is still swollen Pt. Measured bilaterally In sitting:Pt performed  MLD to right upper extremity short neck,,5 breaths, left axillary nodes, anterior interaxillary anastamosis, right inguinal nodes, right axillo-inguinal anastamosis and  right upper and lower arm to hand  with return along pathways and ending with left axillary and right inguinal LN. Pt required multiple VC's throughout MLD for sequence/pressure  04/17/2022 Pts garments removed. Larger pad staying in place a dorsum of hand, but hand is still swollen Performed MLD to right upper extremity short neck,, left axillary nodes, anterior interaxillary anastamosis, right inguinal nodes, right axillo-inguinal anastamosis and lateral trunk, right upper and lower arm to hand (extra time spent at hand where pt has increased edema) with return along pathways and ending with left axillary and right inguinal LN. Reviewed  sequence with pt verbally while performing. She has not heard from [facility] yet about Flexitouch. Surveyor, quantity again today. 04/14/2022 Pts garments removed and pt measured. Bogginess noted at posterior elbow. Pt continues with finger swelling. Therapist demonstrated finger wrap and had pt also do. She will try at night while wearing her night garment. New larger peach foam pad made for dorsum of hand/thumb area to place in glove Perfomed MLD to right upper extremity short neck,, left axillary nodes, anterior interaxillary anastamosis, right inguinal nodes, right axillo-inguinal anastamosis and lateral trunk, right upper and lower arm to hand (extra time spent at hand where pt has increased edema) with return along pathways and ending with left axillary and right inguinal LN. Pt left with sleeve and glove donned Discussed Flexi touch and pt is agreeable to have benefits checked.  04/08/22 Redid SOZO today which is now at 22.3 "off the charts" which frustrated patient - seems to be in chronic lymphedema stage.  Made small lymph pad for in glove Perfomed MLD to right upper extremity short neck,, left axillary nodes, anterior interaxillary anastamosis, right inguinal nodes, right axillo-inguinal anastamosis and lateral trunk, right upper and lower arm to hand (extra time spent at hand where pt has increased edema) with return along pathways and ending with left axillary and right inguinal LN. Pt left with sleeve and glove  donned  04/03/22 Measured pt for compression garments today. She fit in to a [employer]id Profile size 3, a [employer] size II sleeve and size II Medi [employer]. Garment order was placed using [employer] for night time garment and glove and pt purchased her sleeve. Educated pt that the sleeve and glove are to wear during day time and the profile is a night time garment. Remeasured circumferences and pt demonstrates a good decrease at forearm and upper arm today.  Pt wrapped  with medium TG soft: 1/2 grey foam added to dorsum of R hand, elastomull to fingers 1-5 and covered with paper tape, artiflex, 6 cm to hand, 10 cm wrist to axilla in herringbone pattern, and 12 cm wrist to axilla.   03/31/2022 Pt arrived with sleeve and gauntlet donned. Educated pt that the gauntlet is the reason her fingers are swelling worse because the fluid is being pushed in to them.  Perfomed MLD to right upper extremity short neck,, left axillary nodes, anterior interaxillary anastamosis, right inguinal nodes, right axillo-inguinal anastamosis and lateral trunk, right upper and lower arm to hand (extra time spent at hand where pt has increased edema) with return along pathways and ending with left axillary and right inguinal LN. Pt wrapped with medium TG soft: 1/2 grey foam added to dorsum of R hand, elastomull to fingers 1-5 and covered with paper tape, artiflex, 6 cm to hand, 10 cm wrist to axilla in herringbone pattern, and 12 cm wrist to axilla. Pt reported increased comfort with 1/2 grey foam on back of hand.   PATIENT EDUCATION:  Education details: reviewed MLD , encouraged pt to wear compression bra  Person educated: Patient Education method: Explanation   Education comprehension: verbalized understanding and returned demonstration   ASSESSMENT:  CLINICAL IMPRESSION:    Pt reports the foam bracelet helped with swelling on dorsum of hand. Continued with MLD to RUE. Her circumferences are decreasing. Will decrease to 1x/wk. Will look at other sleeve/glove options. Pt would benefit from either a longer sleeve and/or a longer glove.   Pt will benefit from skilled therapeutic intervention to improve on the following deficits: Decreased knowledge of precautions, impaired UE functional use, pain, swelling,decreased ROM, postural dysfunction.   PT treatment/interventions: ADL/Self care home management, Therapeutic exercises, Therapeutic activity, Patient/Family education, Self Care,  Orthotic/Fit training, DME instructions, Manual lymph drainage, Compression bandaging, scar mobilization, and Manual therapy     GOALS: Goals reviewed with patient? Yes  LONG TERM GOALS:  (STG=LTG)  GOALS Name Target Date  Goal status  1 Pt will demonstrate she has regained full shoulder ROM and function post operatively compared to baselines.  Baseline: 05/06/2022 MET  2 Pt will report the discomfort from fullness in her chest and right axilla is decreased by 50% 05/06/2022 MET  3 Pt will be independent in self MLD to the Right UE and trunk to decrease swelling 04/14/2022 MET  4 Pt will have no complaints of right UE tightness/tenderness from cording 04/14/2022 MET  5 Pts. SOZO score will be reduced to be within the green zone 05/12/2022 Increased last visit Carry over  6 Pt will note decreased swelling in dorsum of hand with peach foam pad for glove 05/12/2022 RE-eval      PLAN: PT FREQUENCY/DURATION: 1-2x/week for  6 weeks   PLAN FOR NEXT SESSION: how is foam bracelet? Continue MLD to the right UE, remeasure for longer sleeve and/or glove, MFR to cording in axilla and arm, Most likely does not  need SOZO screening but do 1 more time before DC, Sent demo to [facility] (04/14/2022)   Manus Gunning, PT 05/05/2022, 11:03 AM

## 2022-05-12 ENCOUNTER — Other Ambulatory Visit: Payer: Self-pay | Admitting: Hematology and Oncology

## 2022-05-15 ENCOUNTER — Encounter: Payer: Self-pay | Admitting: Adult Health

## 2022-05-15 ENCOUNTER — Inpatient Hospital Stay: Payer: Medicare PPO | Attending: Hematology and Oncology | Admitting: Adult Health

## 2022-05-15 ENCOUNTER — Other Ambulatory Visit: Payer: Self-pay

## 2022-05-15 ENCOUNTER — Ambulatory Visit: Payer: Medicare PPO | Attending: General Surgery | Admitting: Physical Therapy

## 2022-05-15 ENCOUNTER — Encounter: Payer: Self-pay | Admitting: Physical Therapy

## 2022-05-15 VITALS — BP 129/88 | HR 75 | Temp 97.9°F | Resp 16 | Ht 64.0 in | Wt 182.1 lb

## 2022-05-15 DIAGNOSIS — Z800 Family history of malignant neoplasm of digestive organs: Secondary | ICD-10-CM | POA: Insufficient documentation

## 2022-05-15 DIAGNOSIS — I890 Lymphedema, not elsewhere classified: Secondary | ICD-10-CM | POA: Insufficient documentation

## 2022-05-15 DIAGNOSIS — R293 Abnormal posture: Secondary | ICD-10-CM | POA: Diagnosis not present

## 2022-05-15 DIAGNOSIS — M810 Age-related osteoporosis without current pathological fracture: Secondary | ICD-10-CM | POA: Diagnosis not present

## 2022-05-15 DIAGNOSIS — C50411 Malignant neoplasm of upper-outer quadrant of right female breast: Secondary | ICD-10-CM | POA: Diagnosis not present

## 2022-05-15 DIAGNOSIS — Z79811 Long term (current) use of aromatase inhibitors: Secondary | ICD-10-CM | POA: Insufficient documentation

## 2022-05-15 DIAGNOSIS — Z90710 Acquired absence of both cervix and uterus: Secondary | ICD-10-CM | POA: Insufficient documentation

## 2022-05-15 DIAGNOSIS — Z170 Estrogen receptor positive status [ER+]: Secondary | ICD-10-CM | POA: Diagnosis not present

## 2022-05-15 NOTE — Therapy (Addendum)
OUTPATIENT PHYSICAL THERAPY BREAST CANCER TREATMENT   Patient Name: Christy Flores MRN: [id number] DOB:1951/06/03, 71 y.o., female Today's Date: 05/15/2022   PT End of Session - 05/15/22 1111     Visit Number 19    Number of Visits 25    Date for PT Re-Evaluation 06/26/22    Authorization Type new auth sent today for 6 weeks through 06/26/22    Authorization Time Period 20 thru 05/09/2022    Authorization - Visit Number 18    Authorization - Number of Visits 20    PT Start Time 1110    PT Stop Time 1154    PT Time Calculation (min) 44 min    Activity Tolerance Patient tolerated treatment well    Behavior During Therapy WFL for tasks assessed/performed               Past Medical History:  Diagnosis Date   GERD (gastroesophageal reflux disease)    Senile osteoporosis    Sleep apnea    on CPAP   Past Surgical History:  Procedure Laterality Date   ABDOMINAL HYSTERECTOMY     AXILLARY LYMPH NODE BIOPSY Right 12/17/2021   Procedure: RIGHT AXILLARY SENTINEL LYMPH NODE BIOPSY;  Surgeon: Rolm Bookbinder, MD;  Location: [facility];  Service: General;  Laterality: Right;   BACK SURGERY     BREAST BIOPSY Right 11/27/2021   BREAST BIOPSY Right 11/06/2021   times 2   BREAST LUMPECTOMY WITH RADIOACTIVE SEED LOCALIZATION Right 12/17/2021   Procedure: RIGHT BREAST LUMPECTOMY WITH RADIOACTIVE SEED LOCALIZATION;  Surgeon: Rolm Bookbinder, MD;  Location: [facility];  Service: General;  Laterality: Right;  [place]   lasik   RADIOACTIVE SEED GUIDED EXCISIONAL BREAST BIOPSY Right 12/17/2021   Procedure: RIGHT BREAST SEED BRACKETED EXCISIONAL BIOPSY;  Surgeon: Rolm Bookbinder, MD;  Location: [facility];  Service: General;  Laterality: Right;   RHINOPLASTY     TUBAL LIGATION     Patient Active Problem List   Diagnosis Date Noted   Family history of colon cancer 05/15/2022    Osteoporosis with pathological fracture 12/20/2021   Malignant neoplasm of upper-outer quadrant of right breast in female, estrogen receptor positive ([place]) 11/12/2021   Allergic rhinitis 12/29/2014   Obesity (BMI 30.0-34.9) 12/29/2014   Sleep apnea, obstructive 12/29/2014   Vitamin D deficiency disease 12/29/2014    PCP: Asencion Noble, MD  REFERRING PROVIDER: Rolm Bookbinder, MD  REFERRING DIAG:  Right Breast Cancer  THERAPY DIAG:  Lymphedema, not elsewhere classified  Abnormal posture  Rationale for Evaluation and Treatment Rehabilitation  ONSET DATE: 11/08/2022  SUBJECTIVE:  SUBJECTIVE STATEMENT:    For two days I was so busy and stressed that I did not put my sleeve and glove on. My hand is still swelling. My arm looks fine.    PERTINENT HISTORY:  Patient was diagnosed on 11/07/2021 with right Invasive Ductal Carcinoma grade 1. It measures .7  and . 3  cm and is located in the central quadrant. It is ER+, PR=+,Her 2- with a  low Ki67 .She has a sister with breast cancer in her 60s who she thinks may have some unknown genetic mutation. She has a maternal grandmother and 5 maternal aunts with breast cancer. She thinks her mother had ovarian cancer. She has a brother and sister with colon cancer. She has a brother and her father both having melanoma.  She had a right lumpectomy x 2 on 12/17/2021 with 0/1 lymph nodes removed.  Pt had difficulty with skin rash post op. She has radiation til 02/12/2022   PATIENT GOALS:  Reassess how my recovery is going related to arm function, pain, and swelling.  PAIN:  Are you having pain?  No Pt reports she has soreness in her R hand when she moves it and goes up to her wrist  PRECAUTIONS: Recent Surgery, right UE Lymphedema risk current radiation   ACTIVITY LEVEL /  LEISURE: pt has been doing her post op shoulder exercises  She has not been able to walk because it has been so hot but wants to get back to it as she normally is a "walker" 3- 5 miles a day    OBJECTIVE:    OBSERVATIONS:  Pt  has healed incisions on right breast and axilla with only slight darkening of skin from radiation.  The swelling in her left lateral chest is much reduced. She does still have a firm area with well defined borders under the axillary incision but says that it is getting better   PALPATION: Small cords palpable in right antecubital area when she puts her arm a certain way POSTURE: forward head, rounded shoulders,   UPPER EXTREMITY AROM/PROM:   A/PROM RIGHT  11/12/2021   02/05/2022   Shoulder extension 65    Shoulder flexion 148 162   Shoulder abduction 170 175   Shoulder internal rotation 70    Shoulder external rotation 100                            (Blank rows = not tested)   A/PROM LEFT  11/12/2021  Shoulder extension 57  Shoulder flexion 150  Shoulder abduction 165  Shoulder internal rotation 70  Shoulder external rotation 85                          (Blank rows = not tested)   LYMPHEDEMA ASSESSMENTS:    [facility] RIGHT  11/12/2021 RIGHT 01/22/2022 02/28/2022 9/6/203 03/18/2022 04/03/22 04/14/22 04/22/2022 05/05/22  10 cm proximal to olecranon process 32.3 33 33.2 33.2 33._0 31.7 30.5  Olecranon process 27._1 27.7 28 27.5 26.5 26.6 26.5  10 cm proximal to ulnar styloid process 23.7 24.3 24.0 23._2 23.4 23.4 23  Just proximal to ulnar styloid process 17.9 17.5 17.7 17.5 18.8 18.1 18.1 18.5 18  Across hand at thumb web space 19.9 20 19.9 20 20.3 20.5 20.6 21 20.5  At base of 2nd digit 6.9 7 6.8 6.8 6._3 7.1 7  (Blank rows =  not tested)   [facility] LEFT  11/12/2021 02/28/2022 04/22/2022  10 cm proximal to olecranon process 34.5 33.6 33.0  Olecranon process 27.5 27.5 27.3  10 cm proximal to ulnar styloid process 23.8 23.6 23.6  Just proximal to  ulnar styloid process 17.3 17.8 17.2  Across hand at thumb web space 18.8 19.3 19.0  At base of 2nd digit 6.7 6.9 7.0  (Blank rows = not tested)      TREATMENT TODAY: 05/15/22 Educated pt on need for a longer sleeve for increased coverage at wrist and upper arm. She is still having swelling on dorsum of hand which may be caused by lack of much overlap between her glove and sleeve. Educated pt that she may need a custom glove that goes down further on her forearm and has a pocket for foam on the dorsal surface to help decrease edema. Assisted pt with ordering a longer [employer] Sleeve.  erformed MLD to right upper extremity short neck,, left axillary nodes, anterior interaxillary anastamosis, right inguinal nodes, right axillo-inguinal anastamosis and lateral trunk, right upper and lower arm to hand (extra time spent at hand where pt has increased edema) with return along pathways and ending with left axillary and right inguinal LN.  05/06/22 Remeasured circumferences and pt's arm and hand have decreased throughout. Performed MLD to right upper extremity short neck,, left axillary nodes, anterior interaxillary anastamosis, right inguinal nodes, right axillo-inguinal anastamosis and lateral trunk, right upper and lower arm to hand (extra time spent at hand where pt has increased edema) with return along pathways and ending with left axillary and right inguinal LN. Assisted pt with redonning foam bracelet under edge of sleeve and pt place 1/2 grey foam at dorsum of hand in glove.   05/01/22 Pts garments removed. Larger pad staying in place a dorsum of hand, but hand is still swollen Performed MLD to right upper extremity short neck,, left axillary nodes, anterior interaxillary anastamosis, right inguinal nodes, right axillo-inguinal anastamosis and lateral trunk, right upper and lower arm to hand (extra time spent at hand where pt has increased edema) with return along pathways and ending with  left axillary and right inguinal LN. Created a foam bracelet for pt to wear under end of sleeve to help reduce hand swelling 04/22/2022 Pts garments removed. Larger pad staying in place a dorsum of hand, but hand is still swollen Pt. Measured bilaterally In sitting:Pt performed  MLD to right upper extremity short neck,,5 breaths, left axillary nodes, anterior interaxillary anastamosis, right inguinal nodes, right axillo-inguinal anastamosis and  right upper and lower arm to hand  with return along pathways and ending with left axillary and right inguinal LN. Pt required multiple VC's throughout MLD for sequence/pressure  04/17/2022 Pts garments removed. Larger pad staying in place a dorsum of hand, but hand is still swollen Performed MLD to right upper extremity short neck,, left axillary nodes, anterior interaxillary anastamosis, right inguinal nodes, right axillo-inguinal anastamosis and lateral trunk, right upper and lower arm to hand (extra time spent at hand where pt has increased edema) with return along pathways and ending with left axillary and right inguinal LN. Reviewed sequence with pt verbally while performing. She has not heard from [employer] yet about Flexitouch. Surveyor, quantity again today. 04/14/2022 Pts garments removed and pt measured. Bogginess noted at posterior elbow. Pt continues with finger swelling. Therapist demonstrated finger wrap and had pt also do. She will try at night while wearing her night garment. New larger peach  foam pad made for dorsum of hand/thumb area to place in glove Perfomed MLD to right upper extremity short neck,, left axillary nodes, anterior interaxillary anastamosis, right inguinal nodes, right axillo-inguinal anastamosis and lateral trunk, right upper and lower arm to hand (extra time spent at hand where pt has increased edema) with return along pathways and ending with left axillary and right inguinal LN. Pt left with sleeve and glove  donned Discussed Flexi touch and pt is agreeable to have benefits checked.  04/08/22 Redid SOZO today which is now at 22.3 "off the charts" which frustrated patient - seems to be in chronic lymphedema stage.  Made small lymph pad for in glove Perfomed MLD to right upper extremity short neck,, left axillary nodes, anterior interaxillary anastamosis, right inguinal nodes, right axillo-inguinal anastamosis and lateral trunk, right upper and lower arm to hand (extra time spent at hand where pt has increased edema) with return along pathways and ending with left axillary and right inguinal LN. Pt left with sleeve and glove donned  04/03/22 Measured pt for compression garments today. She fit in to a CircAid Profile size 3, a Medi [employer] Esp[employer] size II sleeve and size II Medi [employer]. Garment order was placed using [employer] for night time garment and glove and pt purchased her sleeve. Educated pt that the sleeve and glove are to wear during day time and the profile is a night time garment. Remeasured circumferences and pt demonstrates a good decrease at forearm and upper arm today.  Pt wrapped with medium TG soft: 1/2 grey foam added to dorsum of R hand, elastomull to fingers 1-5 and covered with paper tape, artiflex, 6 cm to hand, 10 cm wrist to axilla in herringbone pattern, and 12 cm wrist to axilla.   03/31/2022 Pt arrived with sleeve and gauntlet donned. Educated pt that the gauntlet is the reason her fingers are swelling worse because the fluid is being pushed in to them.  Perfomed MLD to right upper extremity short neck,, left axillary nodes, anterior interaxillary anastamosis, right inguinal nodes, right axillo-inguinal anastamosis and lateral trunk, right upper and lower arm to hand (extra time spent at hand where pt has increased edema) with return along pathways and ending with left axillary and right inguinal LN. Pt wrapped with medium TG soft: 1/2 grey foam added to dorsum of R hand,  elastomull to fingers 1-5 and covered with paper tape, artiflex, 6 cm to hand, 10 cm wrist to axilla in herringbone pattern, and 12 cm wrist to axilla. Pt reported increased comfort with 1/2 grey foam on back of hand.   PATIENT EDUCATION:  Education details: reviewed MLD , encouraged pt to wear compression bra  Person educated: Patient Education method: Explanation   Education comprehension: verbalized understanding and returned demonstration   ASSESSMENT:  CLINICAL IMPRESSION:    Updated pt's goals in therapy. Added new goal for compression garments to address increased hand and wrist swelling. Assisted pt with ordering a longer sleeve today. She will see if this helps with back of hand edema. She may need a custom glove in addition to this so added an appropriate goal for this. Pt would benefit from a FlexiTouch compression pump. She failed the trial of the basic pump because her UE circumferential measurements did not decrease during the trial. The FlexiTouch would help improve lymphatic flow, prevent infection and prevent congestion in the chest area. She has continued swelling at the dorsum of the her hand despite self MLD, compression, elevation and  exercise. The back of the hand is thick with edema and does not go down with compression. Pt would benefit from continued skilled PT services to assist pt with obtaining compression garments and a compression pump that will help her manage her lymphedema independently.   Pt will benefit from skilled therapeutic intervention to improve on the following deficits: Decreased knowledge of precautions, impaired UE functional use, pain, swelling,decreased ROM, postural dysfunction.   PT treatment/interventions: ADL/Self care home management, Therapeutic exercises, Therapeutic activity, Patient/Family education, Self Care, Orthotic/Fit training, DME instructions, Manual lymph drainage, Compression bandaging, scar mobilization, and Manual  therapy     GOALS: Goals reviewed with patient? Yes  LONG TERM GOALS:  (STG=LTG)  GOALS Name Target Date  Goal status  1 Pt will demonstrate she has regained full shoulder ROM and function post operatively compared to baselines.  Baseline: 05/06/2022 MET  2 Pt will report the discomfort from fullness in her chest and right axilla is decreased by 50% 05/06/2022 MET  3 Pt will be independent in self MLD to the Right UE and trunk to decrease swelling 04/14/2022 MET  4 Pt will have no complaints of right UE tightness/tenderness from cording 04/14/2022 MET  5 Pts. SOZO score will be reduced to be within the green zone 05/12/2022 Increased last visit 05/15/22- ongoing  6 Pt will note decreased swelling in dorsum of hand with peach foam pad for glove 05/12/2022 05/15/22- Pt is still having increased swelling at dorsum of hand and peach foam pushes swelling in to her fingers  7 Pt will receive a custom compression glove with added compression at dorsum of hand and will receive a longer off the shelf sleeve.  06/12/22 INITIAL      PLAN: PT FREQUENCY/DURATION: 1x/week for  6 weeks   PLAN FOR NEXT SESSION: how is foam bracelet? Continue MLD to the right UE, how is longer sleeve? If she still has increased wrist and hand edema may need to proceed with custom glove   Manus Gunning, PT 05/15/2022, 12:11 PM

## 2022-05-15 NOTE — Progress Notes (Signed)
SURVIVORSHIP VISIT:   BRIEF ONCOLOGIC HISTORY:  Oncology History  Malignant neoplasm of upper-outer quadrant of right breast in female, estrogen receptor positive ([place])  10/23/2021 Initial Diagnosis   Screening mammogram detected abnormalities in the right breast: 12 o'clock position 0.7 cm: Biopsy: Grade 1 IDC with ALH ER 90%, PR 95%, Ki-67 less than 1%, HER2 1+, the other 2 biopsies were benign (fibrocystic change and focal ADH and ALH)   11/12/2021 Cancer Staging   Staging form: Breast, AJCC 8th Edition - Clinical: Stage IA (cT1b, cN0, cM0, G1, ER+, PR+, HER2-) - Signed by Nicholas Lose, MD on 11/12/2021 Stage prefix: Initial diagnosis Histologic grading system: 3 grade system   12/17/2021 Surgery   Right lumpectomy: Grade 1 IDC 10 mm, margins negative, 0/1 lymph node negative, ER 90%, PR 95%, HER2 negative 1+, Ki-67 less than 1% Right lumpectomy: Invasive cribriform carcinoma, grade 1, 2 mm, DCIS grade 2 8 mm, margins negative, ER 100%, PR 100%, Ki-67 5%, HER2 1+ negative   01/22/2022 - 02/12/2022 Radiation Therapy   Site Technique Total Dose (Gy) Dose per Fx (Gy) Completed Fx Beam Energies  Breast, Right: Breast_R 3D 42.56/42.56 2.66 16/16 10X     02/2022 -  Anti-estrogen oral therapy   2.5 mg Letrozole x 5-7 years     INTERVAL HISTORY:  Ms. Brevik to review her survivorship care plan detailing her treatment course for breast cancer, as well as monitoring long-term side effects of that treatment, education regarding health maintenance, screening, and overall wellness and health promotion.     Overall, Ms. Brodrick reports feeling quite well.  She is taking letrozole daily and is tolerating this moderately well with mild hot flashes and occasional hair thinning.  She is also undergoing a lot of stress because her husband has been quite ill and she has been working with taking care of him 2.  She has right arm lymphedema and is following with physical therapy wearing a sleeve and glove,  and is working with insurance company to get a pump covered so she can use this to help with her lymphedema.  REVIEW OF SYSTEMS:  Review of Systems  Constitutional:  Negative for appetite change, chills, fatigue, fever and unexpected weight change.  HENT:   Negative for hearing loss, lump/mass and trouble swallowing.   Eyes:  Negative for eye problems and icterus.  Respiratory:  Negative for chest tightness, cough and shortness of breath.   Cardiovascular:  Negative for chest pain, leg swelling and palpitations.  Gastrointestinal:  Negative for abdominal distention, abdominal pain, constipation, diarrhea, nausea and vomiting.  Endocrine: Positive for hot flashes.  Genitourinary:  Negative for difficulty urinating.   Musculoskeletal:  Negative for arthralgias.  Skin:  Negative for itching and rash.  Neurological:  Negative for dizziness, extremity weakness, headaches and numbness.  Hematological:  Negative for adenopathy. Does not bruise/bleed easily.  Psychiatric/Behavioral:  Negative for depression. The patient is not nervous/anxious.    Breast: Denies any new nodularity, masses, tenderness, nipple changes, or nipple discharge.      ONCOLOGY TREATMENT TEAM:  1. Surgeon:  Dr. Donne Hazel at [facility] 2. Medical Oncologist: Dr. Lindi Adie  3. Radiation Oncologist: Dr. Isidore Moos    PAST MEDICAL/SURGICAL HISTORY:  Past Medical History:  Diagnosis Date   GERD (gastroesophageal reflux disease)    Senile osteoporosis    Sleep apnea    on CPAP   Past Surgical History:  Procedure Laterality Date   ABDOMINAL HYSTERECTOMY     AXILLARY LYMPH NODE BIOPSY  Right 12/17/2021   Procedure: RIGHT AXILLARY SENTINEL LYMPH NODE BIOPSY;  Surgeon: Rolm Bookbinder, MD;  Location: [facility];  Service: General;  Laterality: Right;   BACK SURGERY     BREAST BIOPSY Right 11/27/2021   BREAST BIOPSY Right 11/06/2021   times 2   BREAST LUMPECTOMY WITH RADIOACTIVE SEED  LOCALIZATION Right 12/17/2021   Procedure: RIGHT BREAST LUMPECTOMY WITH RADIOACTIVE SEED LOCALIZATION;  Surgeon: Rolm Bookbinder, MD;  Location: [facility];  Service: General;  Laterality: Right;  [place]   lasik   RADIOACTIVE SEED GUIDED EXCISIONAL BREAST BIOPSY Right 12/17/2021   Procedure: RIGHT BREAST SEED BRACKETED EXCISIONAL BIOPSY;  Surgeon: Rolm Bookbinder, MD;  Location: [facility];  Service: General;  Laterality: Right;   RHINOPLASTY     TUBAL LIGATION       ALLERGIES:  Allergies  Allergen Reactions   Codeine Nausea And Vomiting     CURRENT MEDICATIONS:  Outpatient Encounter Medications as of 05/15/2022  Medication Sig Note   citalopram (CELEXA) 20 MG tablet Take 20 mg by mouth daily.    cycloSPORINE (RESTASIS) 0.05 % ophthalmic emulsion Place 1 drop into both eyes 2 (two) times daily as needed (dry eyes).    letrozole (FEMARA) 2.5 MG tablet TAKE 1 TABLET(2.5 MG) BY MOUTH DAILY    Polyvinyl Alcohol-Povidone (REFRESH OP) Place 1 drop into both eyes daily as needed (dry eyes).    acetaminophen (TYLENOL) 500 MG tablet Take 500 mg by mouth every 6 (six) hours as needed for moderate pain. (Patient not taking: Reported on 05/15/2022)    BIOTIN PO Take 1 tablet by mouth daily. (Patient not taking: Reported on 05/15/2022)    CALCIUM PO Take 1 tablet by mouth daily. (Patient not taking: Reported on 05/15/2022)    Cyanocobalamin (B-12 PO) Take 1 tablet by mouth daily as needed (energy). (Patient not taking: Reported on 05/15/2022) 12/09/2021: On hold for procedure   denosumab (PROLIA) 60 MG/ML SOSY injection Inject 60 mg into the skin every 6 (six) months.    Multiple Vitamins-Minerals (MULTIVITAMIN WITH MINERALS) tablet Take 1 tablet by mouth daily. (Patient not taking: Reported on 05/15/2022)    naproxen sodium (ALEVE) 220 MG tablet Take 220 mg by mouth daily as needed (pain). (Patient not taking: Reported  on 05/15/2022)    traMADol (ULTRAM) 50 MG tablet Take 2 tablets (100 mg total) by mouth every 6 (six) hours as needed. (Patient not taking: Reported on 05/15/2022)    TURMERIC PO Take 1 capsule by mouth daily. (Patient not taking: Reported on 05/15/2022)    VITAMIN D PO Take 1 tablet by mouth daily. (Patient not taking: Reported on 05/15/2022)    No facility-administered encounter medications on file as of 05/15/2022.     ONCOLOGIC FAMILY HISTORY:  No family history on file.    SOCIAL HISTORY:  Social History   Socioeconomic History   Marital status: Married    Spouse name: Not on file   Number of children: Not on file   Years of education: Not on file   Highest education level: Not on file  Occupational History   Not on file  Tobacco Use   Smoking status: Never   Smokeless tobacco: Never  Vaping Use   Vaping Use: Never used  Substance and Sexual Activity   Alcohol use: Not Currently   Drug use: Never   Sexual activity: Not Currently  Birth control/protection: Surgical, None  Other Topics Concern   Not on file  Social History Narrative   Not on file   Social Determinants of Health   Financial Resource Strain: Low Risk  (11/21/2021)   Overall Financial Resource Strain (CARDIA)    Difficulty of Paying Living Expenses: Not hard at all  Food Insecurity: No Food Insecurity (11/21/2021)   Hunger Vital Sign    Worried About Running Out of Food in the Last Year: Never true    Ran Out of Food in the Last Year: Never true  Transportation Needs: No Transportation Needs (11/21/2021)   PRAPARE - Hydrologist (Medical): No    Lack of Transportation (Non-Medical): No  Physical Activity: Not on file  Stress: Not on file  Social Connections: Not on file  Intimate Partner Violence: Not on file     OBSERVATIONS/OBJECTIVE:  BP 129/88 (BP Location: Left Arm, Patient Position: Sitting)   Pulse 75   Temp 97.9 F (36.6 C) (Temporal)   Resp 16   Ht _0   (1.626 m)   Wt 182 lb 1.6 oz (82.6 kg)   SpO2 96%   BMI 31.26 kg/m  GENERAL: Patient is a well appearing female in no acute distress HEENT:  Sclerae anicteric.  Oropharynx clear and moist. No ulcerations or evidence of oropharyngeal candidiasis. Neck is supple.  NODES:  No cervical, supraclavicular, or axillary lymphadenopathy palpated.  BREAST EXAM: Right breast is status postlumpectomy and radiation no sign of local recurrence left breast is benign. LUNGS:  Clear to auscultation bilaterally.  No wheezes or rhonchi. HEART:  Regular rate and rhythm. No murmur appreciated. ABDOMEN:  Soft, nontender.  Positive, normoactive bowel sounds. No organomegaly palpated. MSK:  No focal spinal tenderness to palpation. Full range of motion bilaterally in the upper extremities. EXTREMITIES: Right arm is swollen consistent with her lymphedema with sleeve and glove in place. SKIN:  Clear with no obvious rashes or skin changes. No nail dyscrasia. NEURO:  Nonfocal. Well oriented.  Appropriate affect.   LABORATORY DATA:  None for this visit.  DIAGNOSTIC IMAGING:  None for this visit.      ASSESSMENT AND PLAN:  Ms.. Aaronson is a pleasant 71 y.o. female with Stage 1A right breast invasive ductal carcinoma, ER+/PR+/HER2-, diagnosed in April 2023, treated with lumpectomy, adjuvant radiation therapy, and anti-estrogen therapy with letrozole beginning in August 2023.  She presents to the Survivorship [facility] for our initial meeting and routine follow-up post-completion of treatment for breast cancer.    1. Stage 1A right breast cancer:  Ms. Mun is continuing to recover from definitive treatment for breast cancer. She will follow-up with her medical oncologist, Dr. Lindi Adie in 6 months with history and physical exam per surveillance protocol.  She will continue her anti-estrogen therapy with letrozole. Thus far, she is tolerating the letrozole well, with minimal side effects. Her mammogram is due April 2024;  orders placed today.  Her breast density is category C. Today, a comprehensive survivorship care plan and treatment summary was reviewed with the patient today detailing her breast cancer diagnosis, treatment course, potential late/long-term effects of treatment, appropriate follow-up care with recommendations for the future, and patient education resources.  A copy of this summary, along with a letter will be sent to the patient's primary care provider via mail/fax/In Basket message after today's visit.    2.  Lymphedema: Amended that she continue to follow-up with physical therapy about this and wear her sleeve and glove.  They will let us know if there is anything we need to do or sign regarding acquiring a pump.  3.  Hair thinning: We discussed ways to strengthen her hair follicle.  This is likely secondary to both stress from her husband's illness along with the letrozole.  I recommended that she give the letrozole another few months to get in her system as the hair thinning should stabilize.  If it does not we can consider doing very low-dose oral minoxidil.  4. Bone health:  Given Ms. Espey's age/history of breast cancer and her current treatment regimen including anti-estrogen therapy with letrozole, she is at risk for bone demineralization.  Her last DEXA scan was March 03, 2022 and showed osteoporosis with a T score of -2.5 in the left femur.  She is recommended repeat in August 2025.  She will continue to receive Prolia every 6 months with her primary care provider.  I let her know that if she ever has any difficulty with obtaining the Prolia that we are always happy to help her by ordering it for [place] to give her here.  She was given education on specific activities to promote bone health.  5. Cancer screening:  Due to Ms. Babers's history and her age, she should receive screening for skin cancers, colon cancer.  The information and recommendations are listed on the patient's comprehensive  care plan/treatment summary and were reviewed in detail with the patient.    6. Health maintenance and wellness promotion: Ms. Demeo was encouraged to consume 5-7 servings of fruits and vegetables per day. We reviewed the "Nutrition Rainbow" handout.  She was also encouraged to engage in moderate to vigorous exercise for 30 minutes per day most days of the week. We discussed the [employer] fitness program, which is designed for cancer survivors to help them become more physically fit after cancer treatments.  She was instructed to limit her alcohol consumption and continue to abstain from tobacco use.     7. Support services/counseling: It is not uncommon for this period of the patient's cancer care trajectory to be one of many emotions and stressors.   She was given information regarding our available services and encouraged to contact me with any questions or for help enrolling in any of our support group/programs.    Follow up instructions:    -Return to cancer center in 6 months for follow-up -Mammogram due in 2024 -She is welcome to return back to the Survivorship Clinic at any time; no additional follow-up needed at this time.  -Consider referral back to survivorship as a long-term survivor for continued surveillance  The patient was provided an opportunity to ask questions and all were answered. The patient agreed with the plan and demonstrated an understanding of the instructions.   Total encounter time:40 minutes*in face-to-face visit time, chart review, lab review, care coordination, order entry, and documentation of the encounter time.    Wilber Bihari, NP 05/15/22 9:28 AM [facility] [place], [place] [address] Tel. [phone]    Fax. [phone]  *Total Encounter Time as defined by the Centers for Medicare and Medicaid Services includes, in addition to the face-to-face time of a patient visit (documented in  the note above) non-face-to-face time: obtaining and reviewing outside history, ordering and reviewing medications, tests or procedures, care coordination (communications with other health care professionals or caregivers) and documentation in the medical record.

## 2022-05-16 ENCOUNTER — Telehealth: Payer: Self-pay | Admitting: Adult Health

## 2022-05-16 ENCOUNTER — Encounter: Payer: Self-pay | Admitting: Hematology and Oncology

## 2022-05-16 NOTE — Telephone Encounter (Signed)
Scheduled appointment per 11/9 los. Patient is aware.

## 2022-05-26 ENCOUNTER — Telehealth: Payer: Self-pay | Admitting: Pharmacy Technician

## 2022-05-26 NOTE — Telephone Encounter (Signed)
PA RENEWAL: AP  Auth Submission: APPROVED Payer: [employer] Medication & CPT/J Code(s) submitted: Prolia (Denosumab) [id number] Route of submission (phone, fax, portal):  Phone # [phone] Fax # [phone] Auth type: Buy/Bill Units/visits requested: X2 Reference number: [id number] Approval from: 07/07/21 to 07/07/23

## 2022-06-18 ENCOUNTER — Ambulatory Visit: Payer: Medicare PPO | Attending: General Surgery

## 2022-06-18 DIAGNOSIS — R222 Localized swelling, mass and lump, trunk: Secondary | ICD-10-CM | POA: Diagnosis not present

## 2022-06-18 DIAGNOSIS — Z483 Aftercare following surgery for neoplasm: Secondary | ICD-10-CM | POA: Diagnosis not present

## 2022-06-18 DIAGNOSIS — R293 Abnormal posture: Secondary | ICD-10-CM | POA: Insufficient documentation

## 2022-06-18 DIAGNOSIS — C50111 Malignant neoplasm of central portion of right female breast: Secondary | ICD-10-CM | POA: Diagnosis not present

## 2022-06-18 DIAGNOSIS — I890 Lymphedema, not elsewhere classified: Secondary | ICD-10-CM | POA: Insufficient documentation

## 2022-06-18 DIAGNOSIS — Z170 Estrogen receptor positive status [ER+]: Secondary | ICD-10-CM | POA: Diagnosis not present

## 2022-06-18 NOTE — Therapy (Signed)
OUTPATIENT PHYSICAL THERAPY BREAST CANCER TREATMENT   Patient Name: Christy Flores MRN: [id number] DOB:18-Aug-1950, 71 y.o., female Today's Date: 06/18/2022   PT End of Session - 06/18/22 0904     Visit Number 20    Number of Visits 25    Date for PT Re-Evaluation 06/26/22    Authorization Time Period 1    Authorization - Visit Number 29    Authorization - Number of Visits 25    Progress Note Due on Visit 6    PT Start Time 0905    PT Stop Time 0952    PT Time Calculation (min) 47 min    Activity Tolerance Patient tolerated treatment well    Behavior During Therapy WFL for tasks assessed/performed               Past Medical History:  Diagnosis Date   GERD (gastroesophageal reflux disease)    Senile osteoporosis    Sleep apnea    on CPAP   Past Surgical History:  Procedure Laterality Date   ABDOMINAL HYSTERECTOMY     AXILLARY LYMPH NODE BIOPSY Right 12/17/2021   Procedure: RIGHT AXILLARY SENTINEL LYMPH NODE BIOPSY;  Surgeon: Rolm Bookbinder, MD;  Location: [facility];  Service: General;  Laterality: Right;   BACK SURGERY     BREAST BIOPSY Right 11/27/2021   BREAST BIOPSY Right 11/06/2021   times 2   BREAST LUMPECTOMY WITH RADIOACTIVE SEED LOCALIZATION Right 12/17/2021   Procedure: RIGHT BREAST LUMPECTOMY WITH RADIOACTIVE SEED LOCALIZATION;  Surgeon: Rolm Bookbinder, MD;  Location: [facility];  Service: General;  Laterality: Right;  GEN AND PEC BLOCK   CHOLECYSTECTOMY     EYE SURGERY  2000   lasik   RADIOACTIVE SEED GUIDED EXCISIONAL BREAST BIOPSY Right 12/17/2021   Procedure: RIGHT BREAST SEED BRACKETED EXCISIONAL BIOPSY;  Surgeon: Rolm Bookbinder, MD;  Location: [facility];  Service: General;  Laterality: Right;   RHINOPLASTY     TUBAL LIGATION     Patient Active Problem List   Diagnosis Date Noted   Family history of colon cancer 05/15/2022   Osteoporosis with pathological fracture 12/20/2021   Malignant  neoplasm of upper-outer quadrant of right breast in female, estrogen receptor positive ([place]) 11/12/2021   Allergic rhinitis 12/29/2014   Obesity (BMI 30.0-34.9) 12/29/2014   Sleep apnea, obstructive 12/29/2014   Vitamin D deficiency disease 12/29/2014    PCP: Asencion Noble, MD  REFERRING PROVIDER: Rolm Bookbinder, MD  REFERRING DIAG:  Right Breast Cancer  THERAPY DIAG:  Abnormal posture  Lymphedema, not elsewhere classified  Malignant neoplasm of central portion of right breast in female, estrogen receptor positive ([place])  Localized swelling, mass and lump, trunk  Aftercare following surgery for neoplasm  Rationale for Evaluation and Treatment Rehabilitation  ONSET DATE: 11/08/2022  SUBJECTIVE:  SUBJECTIVE STATEMENT:    [employer] needs a peer to peer review with Dr. Donne Hazel and he is in surgery this week. They need everything you can possibly give them in my notes. I think my arm is better. It does swell if I don't put my sleeve on in the morning. With or without the sleeve the hand gets swollen and  my fingers hurt. The longer sleeve is better at the wrist   PERTINENT HISTORY:  Patient was diagnosed on 11/07/2021 with right Invasive Ductal Carcinoma grade 1. It measures .7  and . 3  cm and is located in the central quadrant. It is ER+, PR=+,Her 2- with a  low Ki67 .She has a sister with breast cancer in her 65s who she thinks may have some unknown genetic mutation. She has a maternal grandmother and 5 maternal aunts with breast cancer. She thinks her mother had ovarian cancer. She has a brother and sister with colon cancer. She has a brother and her father both having melanoma.  She had a right lumpectomy x 2 on 12/17/2021 with 0/1 lymph nodes removed.  Pt had difficulty with skin rash post op. She has  radiation til 02/12/2022   PATIENT GOALS:  Reassess how my recovery is going related to arm function, pain, and swelling.  PAIN:  Are you having pain?  No Pt reports she has soreness in her R hand when she moves it and goes up to her wrist  PRECAUTIONS: Recent Surgery, right UE Lymphedema risk current radiation   ACTIVITY LEVEL / LEISURE: pt has been doing her post op shoulder exercises  She has not been able to walk because it has been so hot but wants to get back to it as she normally is a "walker" 3- 5 miles a day    OBJECTIVE:    OBSERVATIONS:  Pt  has healed incisions on right breast and axilla with only slight darkening of skin from radiation.  The swelling in her left lateral chest is much reduced. She does still have a firm area with well defined borders under the axillary incision but says that it is getting better   PALPATION: Small cords palpable in right antecubital area when she puts her arm a certain way POSTURE: forward head, rounded shoulders,   UPPER EXTREMITY AROM/PROM:   A/PROM RIGHT  11/12/2021   02/05/2022   Shoulder extension 65    Shoulder flexion 148 162   Shoulder abduction 170 175   Shoulder internal rotation 70    Shoulder external rotation 100                            (Blank rows = not tested)   A/PROM LEFT  11/12/2021  Shoulder extension 57  Shoulder flexion 150  Shoulder abduction 165  Shoulder internal rotation 70  Shoulder external rotation 85                          (Blank rows = not tested)   LYMPHEDEMA ASSESSMENTS:    [facility] RIGHT  11/12/2021 RIGHT 01/22/2022 02/28/2022 9/6/203 03/18/2022 04/03/22 04/14/22 04/22/2022 05/05/22 06/18/2022  10 cm proximal to olecranon process 32.3 33 33.2 33.2 33._0 31.7 30.5 30.5  Olecranon process 27._1 27.7 28 27.5 26.5 26.6 26.5 26.0  10 cm proximal to ulnar styloid process 23.7 24.3 24.0 23._2 23.4 23.4 23 23.3  Just proximal to ulnar styloid  process 17.9 17.5 17.7 17.5 18.8 18.1 18.1 18.5 18  17.4  Across hand at thumb web space 19.9 20 19.9 20 20.3 20.5 20.6 21 20.5 20.5  At base of 2nd digit 6.9 7 6.8 6.8 6._0 7.1 7 6.75  (Blank rows = not tested)   [facility] LEFT  11/12/2021 02/28/2022 04/22/2022  10 cm proximal to olecranon process 34.5 33.6 33.0  Olecranon process 27.5 27.5 27.3  10 cm proximal to ulnar styloid process 23.8 23.6 23.6  Just proximal to ulnar styloid process 17.3 17.8 17.2  Across hand at thumb web space 18.8 19.3 19.0  At base of 2nd digit 6.7 6.9 7.0  (Blank rows = not tested)      TREATMENT TODAY:  06/18/2022 Pt. Removed garments Pt remeasured. Improved result at wrist Performed MLD to right upper extremity short neck,, left axillary nodes, anterior interaxillary anastamosis, right inguinal nodes, right axillo-inguinal anastamosis and lateral trunk, right upper and lower arm to hand (extra time spent at hand where pt has increased edema) with return along pathways and ending with left axillary and right inguinal LN.   05/15/22 Educated pt on need for a longer sleeve for increased coverage at wrist and upper arm. She is still having swelling on dorsum of hand which may be caused by lack of much overlap between her glove and sleeve. Educated pt that she may need a custom glove that goes down further on her forearm and has a pocket for foam on the dorsal surface to help decrease edema. Assisted pt with ordering a longer [employer] Sleeve.  erformed MLD to right upper extremity short neck,, left axillary nodes, anterior interaxillary anastamosis, right inguinal nodes, right axillo-inguinal anastamosis and lateral trunk, right upper and lower arm to hand (extra time spent at hand where pt has increased edema) with return along pathways and ending with left axillary and right inguinal LN.  05/06/22 Remeasured circumferences and pt's arm and hand have decreased throughout. Performed MLD to right upper extremity short neck,, left axillary nodes, anterior  interaxillary anastamosis, right inguinal nodes, right axillo-inguinal anastamosis and lateral trunk, right upper and lower arm to hand (extra time spent at hand where pt has increased edema) with return along pathways and ending with left axillary and right inguinal LN. Assisted pt with redonning foam bracelet under edge of sleeve and pt place 1/2 grey foam at dorsum of hand in glove.   05/01/22 Pts garments removed. Larger pad staying in place a dorsum of hand, but hand is still swollen Performed MLD to right upper extremity short neck,, left axillary nodes, anterior interaxillary anastamosis, right inguinal nodes, right axillo-inguinal anastamosis and lateral trunk, right upper and lower arm to hand (extra time spent at hand where pt has increased edema) with return along pathways and ending with left axillary and right inguinal LN. Created a foam bracelet for pt to wear under end of sleeve to help reduce hand swelling 04/22/2022 Pts garments removed. Larger pad staying in place a dorsum of hand, but hand is still swollen Pt. Measured bilaterally In sitting:Pt performed  MLD to right upper extremity short neck,,5 breaths, left axillary nodes, anterior interaxillary anastamosis, right inguinal nodes, right axillo-inguinal anastamosis and  right upper and lower arm to hand  with return along pathways and ending with left axillary and right inguinal LN. Pt required multiple VC's throughout MLD for sequence/pressure  04/17/2022 Pts garments removed. Larger pad staying in place a dorsum of hand, but hand is still  swollen Performed MLD to right upper extremity short neck,, left axillary nodes, anterior interaxillary anastamosis, right inguinal nodes, right axillo-inguinal anastamosis and lateral trunk, right upper and lower arm to hand (extra time spent at hand where pt has increased edema) with return along pathways and ending with left axillary and right inguinal LN. Reviewed sequence with pt verbally  while performing. She has not heard from [employer] yet about Flexitouch. Surveyor, quantity again today. 04/14/2022 Pts garments removed and pt measured. Bogginess noted at posterior elbow. Pt continues with finger swelling. Therapist demonstrated finger wrap and had pt also do. She will try at night while wearing her night garment. New larger peach foam pad made for dorsum of hand/thumb area to place in glove Perfomed MLD to right upper extremity short neck,, left axillary nodes, anterior interaxillary anastamosis, right inguinal nodes, right axillo-inguinal anastamosis and lateral trunk, right upper and lower arm to hand (extra time spent at hand where pt has increased edema) with return along pathways and ending with left axillary and right inguinal LN. Pt left with sleeve and glove donned Discussed Flexi touch and pt is agreeable to have benefits checked.  04/08/22 Redid SOZO today which is now at 22.3 "off the charts" which frustrated patient - seems to be in chronic lymphedema stage.  Made small lymph pad for in glove Perfomed MLD to right upper extremity short neck,, left axillary nodes, anterior interaxillary anastamosis, right inguinal nodes, right axillo-inguinal anastamosis and lateral trunk, right upper and lower arm to hand (extra time spent at hand where pt has increased edema) with return along pathways and ending with left axillary and right inguinal LN. Pt left with sleeve and glove donned  04/03/22 Measured pt for compression garments today. She fit in to a CircAid Profile size 3, a Medi [employer] Esp[employer] size II sleeve and size II Medi [employer]. Garment order was placed using [employer] for night time garment and glove and pt purchased her sleeve. Educated pt that the sleeve and glove are to wear during day time and the profile is a night time garment. Remeasured circumferences and pt demonstrates a good decrease at forearm and upper arm today.  Pt wrapped with medium TG soft: 1/2  grey foam added to dorsum of R hand, elastomull to fingers 1-5 and covered with paper tape, artiflex, 6 cm to hand, 10 cm wrist to axilla in herringbone pattern, and 12 cm wrist to axilla.   03/31/2022 Pt arrived with sleeve and gauntlet donned. Educated pt that the gauntlet is the reason her fingers are swelling worse because the fluid is being pushed in to them.  Perfomed MLD to right upper extremity short neck,, left axillary nodes, anterior interaxillary anastamosis, right inguinal nodes, right axillo-inguinal anastamosis and lateral trunk, right upper and lower arm to hand (extra time spent at hand where pt has increased edema) with return along pathways and ending with left axillary and right inguinal LN. Pt wrapped with medium TG soft: 1/2 grey foam added to dorsum of R hand, elastomull to fingers 1-5 and covered with paper tape, artiflex, 6 cm to hand, 10 cm wrist to axilla in herringbone pattern, and 12 cm wrist to axilla. Pt reported increased comfort with 1/2 grey foam on back of hand.   PATIENT EDUCATION:  Education details: reviewed MLD , encouraged pt to wear compression bra  Person educated: Patient Education method: Explanation   Education comprehension: verbalized understanding and returned demonstration   ASSESSMENT:  CLINICAL IMPRESSION:  Pt continues to have increased swelling at the dorsum of the hand even with the longer sleeve, but her wrist swelling has improved. The texture of pts arm is good, and pt is compliant with doing her MLD and using her compression garments. We will set up an appt for Cassidy's next visit to have her check pts glove and consider making a custom glove.  Pt will benefit from skilled therapeutic intervention to improve on the following deficits: Decreased knowledge of precautions, impaired UE functional use, pain, swelling,decreased ROM, postural dysfunction.   PT treatment/interventions: ADL/Self care home management, Therapeutic exercises,  Therapeutic activity, Patient/Family education, Self Care, Orthotic/Fit training, DME instructions, Manual lymph drainage, Compression bandaging, scar mobilization, and Manual therapy     GOALS: Goals reviewed with patient? Yes  LONG TERM GOALS:  (STG=LTG)  GOALS Name Target Date  Goal status  1 Pt will demonstrate she has regained full shoulder ROM and function post operatively compared to baselines.  Baseline: 05/06/2022 MET  2 Pt will report the discomfort from fullness in her chest and right axilla is decreased by 50% 05/06/2022 MET  3 Pt will be independent in self MLD to the Right UE and trunk to decrease swelling 04/14/2022 MET  4 Pt will have no complaints of right UE tightness/tenderness from cording 04/14/2022 MET  5 Pts. SOZO score will be reduced to be within the green zone 05/12/2022 Increased last visit 05/15/22- ongoing  6 Pt will note decreased swelling in dorsum of hand with peach foam pad for glove 05/12/2022 05/15/22- Pt is still having increased swelling at dorsum of hand and peach foam pushes swelling in to her fingers  7 Pt will receive a custom compression glove with added compression at dorsum of hand and will receive a longer off the shelf sleeve.  06/12/22 INITIAL      PLAN: PT FREQUENCY/DURATION: 1x/week for  6 weeks   PLAN FOR NEXT SESSION:  Recert,how is foam bracelet? Continue MLD to the right UE, how is longer sleeve? If she still has increased wrist and hand edema may need to proceed with custom glove   Claris Pong, PT 06/18/2022, 9:53 AM

## 2022-06-24 ENCOUNTER — Encounter: Payer: Self-pay | Admitting: Physical Therapy

## 2022-06-25 ENCOUNTER — Ambulatory Visit: Payer: Medicare PPO

## 2022-06-25 ENCOUNTER — Telehealth: Payer: Self-pay | Admitting: Physical Therapy

## 2022-06-25 NOTE — Telephone Encounter (Signed)
Pt. came into the clinic to meet with me regarding her complaints about being denied for a Flexitouch pump. She has several complaints about the [employer] company but mostly is frustrated that she has been unable to get the equipment she needs. We discussed the reason for their denial being that she did not trial a basic pump for 4 weeks. We reviewed the documentation I had from [facility], [employer], and her physical therapists. She verbalized understanding that this is [employer]'s guideline requiring the basic pump trial period and that we followed the steps to try to help her bypass the trial - as it is not what is medically necessary for her. But that she was still denied for a Flexitouch.  I called [employer] and spoke with Annie Main (call ref # [id number]). He said I would need to call and try to do a peer to peer. I then called the peer to peer line at [phone] and was told that because her surgeon did not complete the peer to peer within 72 hours of the request, the only option is to either do a 4 week trial of the basic pump or do an appeal process. After discussing with Ms. Zigmund Daniel, she requested to try the appeal as the next step. A letter of medical necessity was written and faxed to the appeals department following the guidelines in the appeal process outlined in her denial letter. A copy of the letter was scanned into Epic and then given to the pt. Patient plans to let us know of the decision by [facility] and if denied again, she said she would try to basic pump for 4 weeks. Annia Friendly, [place] 06/25/22 2:38 PM

## 2022-07-02 ENCOUNTER — Encounter: Payer: Self-pay | Admitting: Physical Therapy

## 2022-07-02 ENCOUNTER — Ambulatory Visit: Payer: Medicare PPO | Admitting: Physical Therapy

## 2022-07-02 DIAGNOSIS — I890 Lymphedema, not elsewhere classified: Secondary | ICD-10-CM | POA: Diagnosis not present

## 2022-07-02 DIAGNOSIS — Z483 Aftercare following surgery for neoplasm: Secondary | ICD-10-CM

## 2022-07-02 DIAGNOSIS — C50111 Malignant neoplasm of central portion of right female breast: Secondary | ICD-10-CM | POA: Diagnosis not present

## 2022-07-02 DIAGNOSIS — Z170 Estrogen receptor positive status [ER+]: Secondary | ICD-10-CM

## 2022-07-02 DIAGNOSIS — R222 Localized swelling, mass and lump, trunk: Secondary | ICD-10-CM | POA: Diagnosis not present

## 2022-07-02 DIAGNOSIS — R293 Abnormal posture: Secondary | ICD-10-CM

## 2022-07-02 NOTE — Therapy (Signed)
OUTPATIENT PHYSICAL THERAPY BREAST CANCER TREATMENT   Patient Name: Christy Flores MRN: [id number] DOB:05/31/1951, 71 y.o., female Today's Date: 07/02/2022   PT End of Session - 07/02/22 0910     Visit Number 21    Number of Visits 27    Date for PT Re-Evaluation 08/13/22    Authorization Type new auth sent today for 6 weeks through 08/13/22    PT Start Time 0904    PT Stop Time 1000    PT Time Calculation (min) 56 min    Activity Tolerance Patient tolerated treatment well    Behavior During Therapy WFL for tasks assessed/performed               Past Medical History:  Diagnosis Date   GERD (gastroesophageal reflux disease)    Senile osteoporosis    Sleep apnea    on CPAP   Past Surgical History:  Procedure Laterality Date   ABDOMINAL HYSTERECTOMY     AXILLARY LYMPH NODE BIOPSY Right 12/17/2021   Procedure: RIGHT AXILLARY SENTINEL LYMPH NODE BIOPSY;  Surgeon: Rolm Bookbinder, MD;  Location: [facility];  Service: General;  Laterality: Right;   BACK SURGERY     BREAST BIOPSY Right 11/27/2021   BREAST BIOPSY Right 11/06/2021   times 2   BREAST LUMPECTOMY WITH RADIOACTIVE SEED LOCALIZATION Right 12/17/2021   Procedure: RIGHT BREAST LUMPECTOMY WITH RADIOACTIVE SEED LOCALIZATION;  Surgeon: Rolm Bookbinder, MD;  Location: [place];  Service: General;  Laterality: Right;  GEN AND PEC BLOCK   CHOLECYSTECTOMY     EYE SURGERY  2000   lasik   RADIOACTIVE SEED GUIDED EXCISIONAL BREAST BIOPSY Right 12/17/2021   Procedure: RIGHT BREAST SEED BRACKETED EXCISIONAL BIOPSY;  Surgeon: Rolm Bookbinder, MD;  Location: [place];  Service: General;  Laterality: Right;   RHINOPLASTY     TUBAL LIGATION     Patient Active Problem List   Diagnosis Date Noted   Family history of colon cancer 05/15/2022   Osteoporosis with pathological fracture 12/20/2021   Malignant neoplasm of upper-outer quadrant of right breast in female, estrogen  receptor positive ([place]) 11/12/2021   Allergic rhinitis 12/29/2014   Obesity (BMI 30.0-34.9) 12/29/2014   Sleep apnea, obstructive 12/29/2014   Vitamin D deficiency disease 12/29/2014    PCP: Asencion Noble, MD  REFERRING PROVIDER: Rolm Bookbinder, MD  REFERRING DIAG:  Right Breast Cancer  THERAPY DIAG:  Lymphedema, not elsewhere classified  Aftercare following surgery for neoplasm  Abnormal posture  Malignant neoplasm of central portion of right breast in female, estrogen receptor positive ([place])  Rationale for Evaluation and Treatment Rehabilitation  ONSET DATE: 11/08/2022  SUBJECTIVE:  SUBJECTIVE STATEMENT:    [employer] needs a peer to peer review with Dr. Donne Hazel and he is in surgery this week. They need everything you can possibly give them in my notes. I think my arm is better. It does swell if I don't put my sleeve on in the morning. With or without the sleeve the hand gets swollen and  my fingers hurt. The longer sleeve is better at the wrist   PERTINENT HISTORY:  Patient was diagnosed on 11/07/2021 with right Invasive Ductal Carcinoma grade 1. It measures .7  and . 3  cm and is located in the central quadrant. It is ER+, PR=+,Her 2- with a  low Ki67 .She has a sister with breast cancer in her 12s who she thinks may have some unknown genetic mutation. She has a maternal grandmother and 5 maternal aunts with breast cancer. She thinks her mother had ovarian cancer. She has a brother and sister with colon cancer. She has a brother and her father both having melanoma.  She had a right lumpectomy x 2 on 12/17/2021 with 0/1 lymph nodes removed.  Pt had difficulty with skin rash post op. She has radiation til 02/12/2022   PATIENT GOALS:  Reassess how my recovery is going related to arm function, pain, and  swelling.  PAIN:  Are you having pain?  No Pt reports she has soreness in her R hand when she moves it and goes up to her wrist  PRECAUTIONS: Recent Surgery, right UE Lymphedema risk current radiation   ACTIVITY LEVEL / LEISURE: pt has been doing her post op shoulder exercises  She has not been able to walk because it has been so hot but wants to get back to it as she normally is a "walker" 3- 5 miles a day    OBJECTIVE:    OBSERVATIONS:  Pt  has healed incisions on right breast and axilla with only slight darkening of skin from radiation.  The swelling in her left lateral chest is much reduced. She does still have a firm area with well defined borders under the axillary incision but says that it is getting better   PALPATION: Small cords palpable in right antecubital area when she puts her arm a certain way POSTURE: forward head, rounded shoulders,   UPPER EXTREMITY AROM/PROM:   A/PROM RIGHT  11/12/2021   02/05/2022   Shoulder extension 65    Shoulder flexion 148 162   Shoulder abduction 170 175   Shoulder internal rotation 70    Shoulder external rotation 100                            (Blank rows = not tested)   A/PROM LEFT  11/12/2021  Shoulder extension 57  Shoulder flexion 150  Shoulder abduction 165  Shoulder internal rotation 70  Shoulder external rotation 85                          (Blank rows = not tested)   LYMPHEDEMA ASSESSMENTS:    [facility] RIGHT  11/12/2021 RIGHT 01/22/2022 02/28/2022 9/6/203 03/18/2022 04/03/22 04/14/22 04/22/2022 05/05/22 06/18/2022 07/02/22  10 cm proximal to olecranon process 32.3 33 33.2 33.2 33._0 31.7 30.5 30.5 30.4  Olecranon process 27._1 27.7 28 27.5 26.5 26.6 26.5 26.0 26  10 cm proximal to ulnar styloid process 23.7 24.3 24.0 23._2 23.4 23.4 23 23.3 23.5  Just  proximal to ulnar styloid process 17.9 17.5 17.7 17.5 18.8 18.1 18.1 18.5 18 17.4 17.4  Across hand at thumb web space 19.9 20 19.9 20 20.3 20.5 20.6 21 20.5 20.5 21   At base of 2nd digit 6.9 7 6.8 6.8 6._0 7.1 7 6.75 6.7  (Blank rows = not tested)   [facility] LEFT  11/12/2021 02/28/2022 04/22/2022  10 cm proximal to olecranon process 34.5 33.6 33.0  Olecranon process 27.5 27.5 27.3  10 cm proximal to ulnar styloid process 23.8 23.6 23.6  Just proximal to ulnar styloid process 17.3 17.8 17.2  Across hand at thumb web space 18.8 19.3 19.0  At base of 2nd digit 6.7 6.9 7.0  (Blank rows = not tested)      TREATMENT TODAY: 07/02/2022 Pt. Removed garments Pt remeasured. Her hand measurement has increased some and pt is having more edema at R lateral trunk. Performed MLD to right upper extremity short neck,, left axillary nodes, anterior interaxillary anastamosis, right inguinal nodes, right axillo-inguinal anastamosis and lateral trunk, right upper and lower arm to hand (extra time spent at hand where pt has increased edema) with return along pathways and ending with left axillary and right inguinal LN.  06/18/2022 Pt. Removed garments Pt remeasured. Improved result at wrist Performed MLD to right upper extremity short neck,, left axillary nodes, anterior interaxillary anastamosis, right inguinal nodes, right axillo-inguinal anastamosis and lateral trunk, right upper and lower arm to hand (extra time spent at hand where pt has increased edema) with return along pathways and ending with left axillary and right inguinal LN.   05/15/22 Educated pt on need for a longer sleeve for increased coverage at wrist and upper arm. She is still having swelling on dorsum of hand which may be caused by lack of much overlap between her glove and sleeve. Educated pt that she may need a custom glove that goes down further on her forearm and has a pocket for foam on the dorsal surface to help decrease edema. Assisted pt with ordering a longer Medi [employer] Sleeve.  erformed MLD to right upper extremity short neck,, left axillary nodes, anterior interaxillary anastamosis,  right inguinal nodes, right axillo-inguinal anastamosis and lateral trunk, right upper and lower arm to hand (extra time spent at hand where pt has increased edema) with return along pathways and ending with left axillary and right inguinal LN.  05/06/22 Remeasured circumferences and pt's arm and hand have decreased throughout. Performed MLD to right upper extremity short neck,, left axillary nodes, anterior interaxillary anastamosis, right inguinal nodes, right axillo-inguinal anastamosis and lateral trunk, right upper and lower arm to hand (extra time spent at hand where pt has increased edema) with return along pathways and ending with left axillary and right inguinal LN. Assisted pt with redonning foam bracelet under edge of sleeve and pt place 1/2 grey foam at dorsum of hand in glove.   05/01/22 Pts garments removed. Larger pad staying in place a dorsum of hand, but hand is still swollen Performed MLD to right upper extremity short neck,, left axillary nodes, anterior interaxillary anastamosis, right inguinal nodes, right axillo-inguinal anastamosis and lateral trunk, right upper and lower arm to hand (extra time spent at hand where pt has increased edema) with return along pathways and ending with left axillary and right inguinal LN. Created a foam bracelet for pt to wear under end of sleeve to help reduce hand swelling 04/22/2022 Pts garments removed. Larger pad staying in place a dorsum of  hand, but hand is still swollen Pt. Measured bilaterally In sitting:Pt performed  MLD to right upper extremity short neck,,5 breaths, left axillary nodes, anterior interaxillary anastamosis, right inguinal nodes, right axillo-inguinal anastamosis and  right upper and lower arm to hand  with return along pathways and ending with left axillary and right inguinal LN. Pt required multiple VC's throughout MLD for sequence/pressure  04/17/2022 Pts garments removed. Larger pad staying in place a dorsum of hand,  but hand is still swollen Performed MLD to right upper extremity short neck,, left axillary nodes, anterior interaxillary anastamosis, right inguinal nodes, right axillo-inguinal anastamosis and lateral trunk, right upper and lower arm to hand (extra time spent at hand where pt has increased edema) with return along pathways and ending with left axillary and right inguinal LN. Reviewed sequence with pt verbally while performing. She has not heard from [employer] yet about Flexitouch. Surveyor, quantity again today. 04/14/2022 Pts garments removed and pt measured. Bogginess noted at posterior elbow. Pt continues with finger swelling. Therapist demonstrated finger wrap and had pt also do. She will try at night while wearing her night garment. New larger peach foam pad made for dorsum of hand/thumb area to place in glove Perfomed MLD to right upper extremity short neck,, left axillary nodes, anterior interaxillary anastamosis, right inguinal nodes, right axillo-inguinal anastamosis and lateral trunk, right upper and lower arm to hand (extra time spent at hand where pt has increased edema) with return along pathways and ending with left axillary and right inguinal LN. Pt left with sleeve and glove donned Discussed Flexi touch and pt is agreeable to have benefits checked.  04/08/22 Redid SOZO today which is now at 22.3 "off the charts" which frustrated patient - seems to be in chronic lymphedema stage.  Made small lymph pad for in glove Perfomed MLD to right upper extremity short neck,, left axillary nodes, anterior interaxillary anastamosis, right inguinal nodes, right axillo-inguinal anastamosis and lateral trunk, right upper and lower arm to hand (extra time spent at hand where pt has increased edema) with return along pathways and ending with left axillary and right inguinal LN. Pt left with sleeve and glove donned  04/03/22 Measured pt for compression garments today. She fit in to a CircAid Profile size  3, a Medi Mondi Espirit size II sleeve and size II Medi [employer]. Garment order was placed using [employer] for night time garment and glove and pt purchased her sleeve. Educated pt that the sleeve and glove are to wear during day time and the profile is a night time garment. Remeasured circumferences and pt demonstrates a good decrease at forearm and upper arm today.  Pt wrapped with medium TG soft: 1/2 grey foam added to dorsum of R hand, elastomull to fingers 1-5 and covered with paper tape, artiflex, 6 cm to hand, 10 cm wrist to axilla in herringbone pattern, and 12 cm wrist to axilla.   03/31/2022 Pt arrived with sleeve and gauntlet donned. Educated pt that the gauntlet is the reason her fingers are swelling worse because the fluid is being pushed in to them.  Perfomed MLD to right upper extremity short neck,, left axillary nodes, anterior interaxillary anastamosis, right inguinal nodes, right axillo-inguinal anastamosis and lateral trunk, right upper and lower arm to hand (extra time spent at hand where pt has increased edema) with return along pathways and ending with left axillary and right inguinal LN. Pt wrapped with medium TG soft: 1/2 grey foam added to dorsum of R  hand, elastomull to fingers 1-5 and covered with paper tape, artiflex, 6 cm to hand, 10 cm wrist to axilla in herringbone pattern, and 12 cm wrist to axilla. Pt reported increased comfort with 1/2 grey foam on back of hand.   PATIENT EDUCATION:  Education details: reviewed MLD , encouraged pt to wear compression bra  Person educated: Patient Education method: Explanation   Education comprehension: verbalized understanding and returned demonstration   ASSESSMENT:  CLINICAL IMPRESSION:    Assessed pt's progress towards goals in therapy. Pt is unable to get measured for a custom compression sleeve until 07/16/22 due to the DME companies schedule. She is continuing ot present with increased edema at dorsum of hand. Her long  sleeve is controlling the edema at her wrist. Her lymphedema along R lateral trunk has worsened and pt would still benefit from a FlexiTouch compression pump to help manage her RUE and truncal edema. Pt would benefit from continued skilled PT services to continue to try and decrease RUE lymphedema and assist pt with obtaining appropriate compression garments for independent management.   Pt will benefit from skilled therapeutic intervention to improve on the following deficits: Decreased knowledge of precautions, impaired UE functional use, pain, swelling,decreased ROM, postural dysfunction.   PT treatment/interventions: ADL/Self care home management, Therapeutic exercises, Therapeutic activity, Patient/Family education, Self Care, Orthotic/Fit training, DME instructions, Manual lymph drainage, Compression bandaging, scar mobilization, and Manual therapy     GOALS: Goals reviewed with patient? Yes  LONG TERM GOALS:  (STG=LTG)  GOALS Name Target Date  Goal status  1 Pt will demonstrate she has regained full shoulder ROM and function post operatively compared to baselines.  Baseline: 05/06/2022 MET  2 Pt will report the discomfort from fullness in her chest and right axilla is decreased by 50% 05/06/2022 MET  3 Pt will be independent in self MLD to the Right UE and trunk to decrease swelling 04/14/2022 MET  4 Pt will have no complaints of right UE tightness/tenderness from cording 04/14/2022 MET  5 Pts. SOZO score will be reduced to be within the green zone 05/12/2022 DEFERRED- pt has lymphedema and will not return to the green  6 Pt will note decreased swelling in dorsum of hand with peach foam pad for glove 05/12/2022 05/15/22- Pt is still having increased swelling at dorsum of hand and peach foam pushes swelling in to her fingers; 07/02/22- hand has increased further- plan is to order custom glove in January  7 Pt will receive a custom compression glove with added compression at dorsum of hand and  will receive a longer off the shelf sleeve.  06/12/22 ONGOING - 07/02/22- pt to be measured in January      PLAN: PT FREQUENCY/DURATION: 1x/week for  6 weeks   PLAN FOR NEXT SESSION:  Continue MLD to the right UE, how is longer sleeve? If she still has increased wrist and hand edema may need to proceed with custom glove - sent in basket message to pt to see if 07/16/22 would work at 1 for measurement    [employer], PT 07/02/2022, 1:09 PM

## 2022-07-03 ENCOUNTER — Other Ambulatory Visit: Payer: Self-pay

## 2022-07-16 ENCOUNTER — Encounter: Payer: Self-pay | Admitting: Physical Therapy

## 2022-07-16 ENCOUNTER — Ambulatory Visit: Payer: Medicare PPO | Attending: General Surgery | Admitting: Physical Therapy

## 2022-07-16 ENCOUNTER — Encounter (HOSPITAL_COMMUNITY)
Admission: RE | Admit: 2022-07-16 | Discharge: 2022-07-16 | Disposition: A | Discharge status: Home / Self Care | Payer: Medicare PPO | Source: Ambulatory Visit | Attending: Internal Medicine | Admitting: Internal Medicine

## 2022-07-16 VITALS — BP 116/71 | HR 64 | Temp 98.2°F | Resp 20

## 2022-07-16 DIAGNOSIS — R293 Abnormal posture: Secondary | ICD-10-CM | POA: Insufficient documentation

## 2022-07-16 DIAGNOSIS — Z170 Estrogen receptor positive status [ER+]: Secondary | ICD-10-CM | POA: Insufficient documentation

## 2022-07-16 DIAGNOSIS — I890 Lymphedema, not elsewhere classified: Secondary | ICD-10-CM | POA: Insufficient documentation

## 2022-07-16 DIAGNOSIS — M810 Age-related osteoporosis without current pathological fracture: Secondary | ICD-10-CM | POA: Insufficient documentation

## 2022-07-16 DIAGNOSIS — C50111 Malignant neoplasm of central portion of right female breast: Secondary | ICD-10-CM | POA: Diagnosis not present

## 2022-07-16 DIAGNOSIS — Z483 Aftercare following surgery for neoplasm: Secondary | ICD-10-CM | POA: Insufficient documentation

## 2022-07-16 DIAGNOSIS — M8000XA Age-related osteoporosis with current pathological fracture, unspecified site, initial encounter for fracture: Secondary | ICD-10-CM

## 2022-07-16 MED ORDER — DENOSUMAB 60 MG/ML ~~LOC~~ SOSY
60.0000 mg | PREFILLED_SYRINGE | Freq: Once | SUBCUTANEOUS | Status: AC
  Administered 2022-07-16: 60 mg via SUBCUTANEOUS
  Filled 2022-07-16: qty 1

## 2022-07-16 NOTE — Addendum Note (Signed)
Encounter addended by: Baxter Hire, RN on: 07/16/2022 9:53 AM  Actions taken: Therapy plan modified

## 2022-07-16 NOTE — Progress Notes (Signed)
Diagnosis: Osteoporosis  Provider:  Asencion Noble MD  Procedure: Injection  Prolia (Denosumab), Dose: 60 mg, Site: subcutaneous, Number of injections: 1  Post Care: Patient declined observation  Discharge: Condition: Good, Destination: Home . AVS provided to patient.   Performed by:  Binnie Kand, RN

## 2022-07-16 NOTE — Therapy (Signed)
OUTPATIENT PHYSICAL THERAPY BREAST CANCER TREATMENT   Patient Name: Christy Flores MRN: [id number] DOB:1951/02/14, 72 y.o., female Today's Date: 07/16/2022   PT End of Session - 07/16/22 1205     Visit Number 22    Number of Visits 27    Date for PT Re-Evaluation 08/13/22    PT Start Time 1058    PT Stop Time 1200    PT Time Calculation (min) 62 min    Activity Tolerance Patient tolerated treatment well    Behavior During Therapy WFL for tasks assessed/performed               Past Medical History:  Diagnosis Date   GERD (gastroesophageal reflux disease)    Senile osteoporosis    Sleep apnea    on CPAP   Past Surgical History:  Procedure Laterality Date   ABDOMINAL HYSTERECTOMY     AXILLARY LYMPH NODE BIOPSY Right 12/17/2021   Procedure: RIGHT AXILLARY SENTINEL LYMPH NODE BIOPSY;  Surgeon: Rolm Bookbinder, MD;  Location: [place];  Service: General;  Laterality: Right;   BACK SURGERY     BREAST BIOPSY Right 11/27/2021   BREAST BIOPSY Right 11/06/2021   times 2   BREAST LUMPECTOMY WITH RADIOACTIVE SEED LOCALIZATION Right 12/17/2021   Procedure: RIGHT BREAST LUMPECTOMY WITH RADIOACTIVE SEED LOCALIZATION;  Surgeon: Rolm Bookbinder, MD;  Location: [place];  Service: General;  Laterality: Right;  GEN AND PEC BLOCK   CHOLECYSTECTOMY     EYE SURGERY  2000   lasik   RADIOACTIVE SEED GUIDED EXCISIONAL BREAST BIOPSY Right 12/17/2021   Procedure: RIGHT BREAST SEED BRACKETED EXCISIONAL BIOPSY;  Surgeon: Rolm Bookbinder, MD;  Location: [place];  Service: General;  Laterality: Right;   RHINOPLASTY     TUBAL LIGATION     Patient Active Problem List   Diagnosis Date Noted   Family history of colon cancer 05/15/2022   Osteoporosis with pathological fracture 12/20/2021   Malignant neoplasm of upper-outer quadrant of right breast in female, estrogen receptor positive ([place]) 11/12/2021   Allergic rhinitis 12/29/2014    Obesity (BMI 30.0-34.9) 12/29/2014   Sleep apnea, obstructive 12/29/2014   Vitamin D deficiency disease 12/29/2014    PCP: Asencion Noble, MD  REFERRING PROVIDER: Rolm Bookbinder, MD  REFERRING DIAG:  Right Breast Cancer  THERAPY DIAG:  Lymphedema, not elsewhere classified  Aftercare following surgery for neoplasm  Abnormal posture  Malignant neoplasm of central portion of right breast in female, estrogen receptor positive ([place])  Rationale for Evaluation and Treatment Rehabilitation  ONSET DATE: 11/08/2022  SUBJECTIVE:  SUBJECTIVE STATEMENT:    The request for the pump got denied again. I want to go ahead and do the basic pump trial.    PERTINENT HISTORY:  Patient was diagnosed on 11/07/2021 with right Invasive Ductal Carcinoma grade 1. It measures .7  and . 3  cm and is located in the central quadrant. It is ER+, PR=+,Her 2- with a  low Ki67 .She has a sister with breast cancer in her 94s who she thinks may have some unknown genetic mutation. She has a maternal grandmother and 5 maternal aunts with breast cancer. She thinks her mother had ovarian cancer. She has a brother and sister with colon cancer. She has a brother and her father both having melanoma.  She had a right lumpectomy x 2 on 12/17/2021 with 0/1 lymph nodes removed.  Pt had difficulty with skin rash post op. She has radiation til 02/12/2022   PATIENT GOALS:  Reassess how my recovery is going related to arm function, pain, and swelling.  PAIN:  Are you having pain?  5/10 in R hand and lateral trunk, sharp pain, nothing makes it worse, massage improves it  PRECAUTIONS: Recent Surgery, right UE Lymphedema risk current radiation   ACTIVITY LEVEL / LEISURE: pt has been doing her post op shoulder exercises  She has not been able to walk because  it has been so hot but wants to get back to it as she normally is a "walker" 3- 5 miles a day    OBJECTIVE:    OBSERVATIONS:  Pt  has healed incisions on right breast and axilla with only slight darkening of skin from radiation.  The swelling in her left lateral chest is much reduced. She does still have a firm area with well defined borders under the axillary incision but says that it is getting better   PALPATION: Small cords palpable in right antecubital area when she puts her arm a certain way POSTURE: forward head, rounded shoulders,   UPPER EXTREMITY AROM/PROM:   A/PROM RIGHT  11/12/2021   02/05/2022   Shoulder extension 65    Shoulder flexion 148 162   Shoulder abduction 170 175   Shoulder internal rotation 70    Shoulder external rotation 100                            (Blank rows = not tested)   A/PROM LEFT  11/12/2021  Shoulder extension 57  Shoulder flexion 150  Shoulder abduction 165  Shoulder internal rotation 70  Shoulder external rotation 85                          (Blank rows = not tested)   LYMPHEDEMA ASSESSMENTS:    [facility] RIGHT  11/12/2021 RIGHT 01/22/2022 02/28/2022 9/6/203 03/18/2022 04/03/22 04/14/22 04/22/2022 05/05/22 06/18/2022 07/02/22  10 cm proximal to olecranon process 32.3 33 33.2 33.2 33.'2 31 31 '$ 31.7 30.5 30.5 30.4  Olecranon process 27.'2 27 28 '$ 27.7 28 27.5 26.5 26.6 26.5 26.0 26  10 cm proximal to ulnar styloid process 23.7 24.3 24.0 23.'5 25 23 '$ 23.4 23.4 23 23.3 23.5  Just proximal to ulnar styloid process 17.9 17.5 17.7 17.5 18.8 18.1 18.1 18.5 18 17.4 17.4  Across hand at thumb web space 19.9 20 19.9 20 20.3 20.5 20.6 21 20.5 20.5 21  At base of 2nd digit 6.9 7 6.8 6.8 6.'9 7 7 '$ 7.1 7 6.75 6.7  (Blank  rows = not tested)   [facility] LEFT  11/12/2021 02/28/2022 04/22/2022  10 cm proximal to olecranon process 34.5 33.6 33.0  Olecranon process 27.5 27.5 27.3  10 cm proximal to ulnar styloid process 23.8 23.6 23.6  Just proximal to ulnar styloid process  17.3 17.8 17.2  Across hand at thumb web space 18.8 19.3 19.0  At base of 2nd digit 6.7 6.9 7.0  (Blank rows = not tested)      TREATMENT TODAY: 07/02/2022 Pt. Removed garments Pt remeasured. Her hand measurement has increased some and pt is having more edema at R lateral trunk. Performed MLD to right upper extremity short neck,, left axillary nodes, anterior interaxillary anastamosis, right inguinal nodes, right axillo-inguinal anastamosis and lateral trunk, right upper and lower arm to hand (extra time spent at hand where pt has increased edema) with return along pathways and ending with left axillary and right inguinal LN. Pt to be measured for custom glove right after this appt.   07/02/2022 Pt. Removed garments Pt remeasured. Her hand measurement has increased some and pt is having more edema at R lateral trunk. Performed MLD to right upper extremity short neck,, left axillary nodes, anterior interaxillary anastamosis, right inguinal nodes, right axillo-inguinal anastamosis and lateral trunk, right upper and lower arm to hand (extra time spent at hand where pt has increased edema) with return along pathways and ending with left axillary and right inguinal LN.  06/18/2022 Pt. Removed garments Pt remeasured. Improved result at wrist Performed MLD to right upper extremity short neck,, left axillary nodes, anterior interaxillary anastamosis, right inguinal nodes, right axillo-inguinal anastamosis and lateral trunk, right upper and lower arm to hand (extra time spent at hand where pt has increased edema) with return along pathways and ending with left axillary and right inguinal LN.   05/15/22 Educated pt on need for a longer sleeve for increased coverage at wrist and upper arm. She is still having swelling on dorsum of hand which may be caused by lack of much overlap between her glove and sleeve. Educated pt that she may need a custom glove that goes down further on her forearm and has a  pocket for foam on the dorsal surface to help decrease edema. Assisted pt with ordering a longer [employer] Sleeve.  erformed MLD to right upper extremity short neck,, left axillary nodes, anterior interaxillary anastamosis, right inguinal nodes, right axillo-inguinal anastamosis and lateral trunk, right upper and lower arm to hand (extra time spent at hand where pt has increased edema) with return along pathways and ending with left axillary and right inguinal LN.  05/06/22 Remeasured circumferences and pt's arm and hand have decreased throughout. Performed MLD to right upper extremity short neck,, left axillary nodes, anterior interaxillary anastamosis, right inguinal nodes, right axillo-inguinal anastamosis and lateral trunk, right upper and lower arm to hand (extra time spent at hand where pt has increased edema) with return along pathways and ending with left axillary and right inguinal LN. Assisted pt with redonning foam bracelet under edge of sleeve and pt place 1/2 grey foam at dorsum of hand in glove.   05/01/22 Pts garments removed. Larger pad staying in place a dorsum of hand, but hand is still swollen Performed MLD to right upper extremity short neck,, left axillary nodes, anterior interaxillary anastamosis, right inguinal nodes, right axillo-inguinal anastamosis and lateral trunk, right upper and lower arm to hand (extra time spent at hand where pt has increased edema) with return along pathways and ending with  left axillary and right inguinal LN. Created a foam bracelet for pt to wear under end of sleeve to help reduce hand swelling 04/22/2022 Pts garments removed. Larger pad staying in place a dorsum of hand, but hand is still swollen Pt. Measured bilaterally In sitting:Pt performed  MLD to right upper extremity short neck,,5 breaths, left axillary nodes, anterior interaxillary anastamosis, right inguinal nodes, right axillo-inguinal anastamosis and  right upper and lower arm to  hand  with return along pathways and ending with left axillary and right inguinal LN. Pt required multiple VC's throughout MLD for sequence/pressure  04/17/2022 Pts garments removed. Larger pad staying in place a dorsum of hand, but hand is still swollen Performed MLD to right upper extremity short neck,, left axillary nodes, anterior interaxillary anastamosis, right inguinal nodes, right axillo-inguinal anastamosis and lateral trunk, right upper and lower arm to hand (extra time spent at hand where pt has increased edema) with return along pathways and ending with left axillary and right inguinal LN. Reviewed sequence with pt verbally while performing. She has not heard from [employer] yet about Flexitouch. Surveyor, quantity again today. 04/14/2022 Pts garments removed and pt measured. Bogginess noted at posterior elbow. Pt continues with finger swelling. Therapist demonstrated finger wrap and had pt also do. She will try at night while wearing her night garment. New larger peach foam pad made for dorsum of hand/thumb area to place in glove Perfomed MLD to right upper extremity short neck,, left axillary nodes, anterior interaxillary anastamosis, right inguinal nodes, right axillo-inguinal anastamosis and lateral trunk, right upper and lower arm to hand (extra time spent at hand where pt has increased edema) with return along pathways and ending with left axillary and right inguinal LN. Pt left with sleeve and glove donned Discussed Flexi touch and pt is agreeable to have benefits checked.  04/08/22 Redid SOZO today which is now at 22.3 "off the charts" which frustrated patient - seems to be in chronic lymphedema stage.  Made small lymph pad for in glove Perfomed MLD to right upper extremity short neck,, left axillary nodes, anterior interaxillary anastamosis, right inguinal nodes, right axillo-inguinal anastamosis and lateral trunk, right upper and lower arm to hand (extra time spent at hand where pt  has increased edema) with return along pathways and ending with left axillary and right inguinal LN. Pt left with sleeve and glove donned  04/03/22 Measured pt for compression garments today. She fit in to a CircAid Profile size 3, a Medi [employer] Esp[employer] size II sleeve and size II Medi [employer]. Garment order was placed using [employer] for night time garment and glove and pt purchased her sleeve. Educated pt that the sleeve and glove are to wear during day time and the profile is a night time garment. Remeasured circumferences and pt demonstrates a good decrease at forearm and upper arm today.  Pt wrapped with medium TG soft: 1/2 grey foam added to dorsum of R hand, elastomull to fingers 1-5 and covered with paper tape, artiflex, 6 cm to hand, 10 cm wrist to axilla in herringbone pattern, and 12 cm wrist to axilla.   03/31/2022 Pt arrived with sleeve and gauntlet donned. Educated pt that the gauntlet is the reason her fingers are swelling worse because the fluid is being pushed in to them.  Perfomed MLD to right upper extremity short neck,, left axillary nodes, anterior interaxillary anastamosis, right inguinal nodes, right axillo-inguinal anastamosis and lateral trunk, right upper and lower arm to hand (extra time  spent at hand where pt has increased edema) with return along pathways and ending with left axillary and right inguinal LN. Pt wrapped with medium TG soft: 1/2 grey foam added to dorsum of R hand, elastomull to fingers 1-5 and covered with paper tape, artiflex, 6 cm to hand, 10 cm wrist to axilla in herringbone pattern, and 12 cm wrist to axilla. Pt reported increased comfort with 1/2 grey foam on back of hand.   PATIENT EDUCATION:  Education details: reviewed MLD , encouraged pt to wear compression bra  Person educated: Patient Education method: Explanation   Education comprehension: verbalized understanding and returned demonstration   ASSESSMENT:  CLINICAL IMPRESSION:    Pt  continues to demonstrate increased edema at dorsum of hand. She is being measured today for a custom glove with extra compression over this area to help contain the swelling. She has been wearing her sleeve and her arm lymphedema has been more controlled than her hand. She is going to pursue the basic pump since the FlexiTouch pump has been denied and she would really benefit from a pump.   Pt will benefit from skilled therapeutic intervention to improve on the following deficits: Decreased knowledge of precautions, impaired UE functional use, pain, swelling,decreased ROM, postural dysfunction.   PT treatment/interventions: ADL/Self care home management, Therapeutic exercises, Therapeutic activity, Patient/Family education, Self Care, Orthotic/Fit training, DME instructions, Manual lymph drainage, Compression bandaging, scar mobilization, and Manual therapy     GOALS: Goals reviewed with patient? Yes  LONG TERM GOALS:  (STG=LTG)  GOALS Name Target Date  Goal status  1 Pt will demonstrate she has regained full shoulder ROM and function post operatively compared to baselines.  Baseline: 05/06/2022 MET  2 Pt will report the discomfort from fullness in her chest and right axilla is decreased by 50% 05/06/2022 MET  3 Pt will be independent in self MLD to the Right UE and trunk to decrease swelling 04/14/2022 MET  4 Pt will have no complaints of right UE tightness/tenderness from cording 04/14/2022 MET  5 Pts. SOZO score will be reduced to be within the green zone 05/12/2022 DEFERRED- pt has lymphedema and will not return to the green  6 Pt will note decreased swelling in dorsum of hand with peach foam pad for glove 05/12/2022 05/15/22- Pt is still having increased swelling at dorsum of hand and peach foam pushes swelling in to her fingers; 07/02/22- hand has increased further- plan is to order custom glove in January  7 Pt will receive a custom compression glove with added compression at dorsum of hand  and will receive a longer off the shelf sleeve.  06/12/22 ONGOING - 07/02/22- pt to be measured in January      PLAN: PT FREQUENCY/DURATION: 1x/week for  6 weeks   PLAN FOR NEXT SESSION:  Continue MLD to the right UE, how is longer sleeve? If she still has increased wrist and hand edema may need to proceed with custom glove - sent in basket message to pt to see if 07/16/22 would work at 1 for measurement    [employer], PT 07/16/2022, 1:08 PM

## 2022-07-22 ENCOUNTER — Ambulatory Visit: Payer: Medicare PPO | Admitting: Physical Therapy

## 2022-07-22 ENCOUNTER — Encounter: Payer: Self-pay | Admitting: Physical Therapy

## 2022-07-22 DIAGNOSIS — I890 Lymphedema, not elsewhere classified: Secondary | ICD-10-CM

## 2022-07-22 DIAGNOSIS — Z483 Aftercare following surgery for neoplasm: Secondary | ICD-10-CM | POA: Diagnosis not present

## 2022-07-22 DIAGNOSIS — Z170 Estrogen receptor positive status [ER+]: Secondary | ICD-10-CM

## 2022-07-22 DIAGNOSIS — R293 Abnormal posture: Secondary | ICD-10-CM

## 2022-07-22 DIAGNOSIS — C50111 Malignant neoplasm of central portion of right female breast: Secondary | ICD-10-CM | POA: Diagnosis not present

## 2022-07-22 NOTE — Therapy (Addendum)
 OUTPATIENT PHYSICAL THERAPY BREAST CANCER TREATMENT   Patient Name: Christy Flores MRN: [id number] DOB:04-24-1951, 72 y.o., female Today's Date: 07/22/2022   PT End of Session - 07/22/22 1414     Visit Number 23    Number of Visits 27    Date for PT Re-Evaluation 08/13/22    Authorization Type new auth sent today for 6 weeks through 08/13/22    PT Start Time 1415   pt arrived late   PT Stop Time 1455    PT Time Calculation (min) 40 min    Activity Tolerance Patient tolerated treatment well    Behavior During Therapy WFL for tasks assessed/performed               Past Medical History:  Diagnosis Date   GERD (gastroesophageal reflux disease)    Senile osteoporosis    Sleep apnea    on CPAP   Past Surgical History:  Procedure Laterality Date   ABDOMINAL HYSTERECTOMY     AXILLARY LYMPH NODE BIOPSY Right 12/17/2021   Procedure: RIGHT AXILLARY SENTINEL LYMPH NODE BIOPSY;  Surgeon: Emelia Loron, MD;  Location: [facility];  Service: General;  Laterality: Right;   BACK SURGERY     BREAST BIOPSY Right 11/27/2021   BREAST BIOPSY Right 11/06/2021   times 2   BREAST LUMPECTOMY WITH RADIOACTIVE SEED LOCALIZATION Right 12/17/2021   Procedure: RIGHT BREAST LUMPECTOMY WITH RADIOACTIVE SEED LOCALIZATION;  Surgeon: Emelia Loron, MD;  Location: [facility];  Service: General;  Laterality: Right;  GEN AND PEC BLOCK   CHOLECYSTECTOMY     EYE SURGERY  2000   lasik   RADIOACTIVE SEED GUIDED EXCISIONAL BREAST BIOPSY Right 12/17/2021   Procedure: RIGHT BREAST SEED BRACKETED EXCISIONAL BIOPSY;  Surgeon: Emelia Loron, MD;  Location: [facility];  Service: General;  Laterality: Right;   RHINOPLASTY     TUBAL LIGATION     Patient Active Problem List   Diagnosis Date Noted   Family history of colon cancer 05/15/2022   Osteoporosis with pathological fracture 12/20/2021   Malignant neoplasm of upper-outer quadrant of right breast in  female, estrogen receptor positive (HCC) 11/12/2021   Allergic rhinitis 12/29/2014   Obesity (BMI 30.0-34.9) 12/29/2014   Sleep apnea, obstructive 12/29/2014   Vitamin D deficiency disease 12/29/2014    PCP: Carylon Perches, MD  REFERRING PROVIDER: Emelia Loron, MD  REFERRING DIAG:  Right Breast Cancer  THERAPY DIAG:  Lymphedema, not elsewhere classified  Aftercare following surgery for neoplasm  Abnormal posture  Malignant neoplasm of central portion of right breast in female, estrogen receptor positive (HCC)  Rationale for Evaluation and Treatment Rehabilitation  ONSET DATE: 11/08/2022  SUBJECTIVE:  SUBJECTIVE STATEMENT:    I have not had my sleeve on today. In the morning my arm and hand looks pretty good.    PERTINENT HISTORY:  Patient was diagnosed on 11/07/2021 with right Invasive Ductal Carcinoma grade 1. It measures .7  and . 3  cm and is located in the central quadrant. It is ER+, PR=+,Her 2- with a  low Ki67 .She has a sister with breast cancer in her 64s who she thinks may have some unknown genetic mutation. She has a maternal grandmother and 5 maternal aunts with breast cancer. She thinks her mother had ovarian cancer. She has a brother and sister with colon cancer. She has a brother and her father both having melanoma.  She had a right lumpectomy x 2 on 12/17/2021 with 0/1 lymph nodes removed.  Pt had difficulty with skin rash post op. She has radiation til 02/12/2022   PATIENT GOALS:  Reassess how my recovery is going related to arm function, pain, and swelling.  PAIN:  Are you having pain?  5/10 in R hand and lateral trunk, sharp pain, nothing makes it worse, massage improves it  PRECAUTIONS: Recent Surgery, right UE Lymphedema risk current radiation   ACTIVITY LEVEL / LEISURE: pt has  been doing her post op shoulder exercises  She has not been able to walk because it has been so hot but wants to get back to it as she normally is a "walker" 3- 5 miles a day    OBJECTIVE:    OBSERVATIONS:  Pt  has healed incisions on right breast and axilla with only slight darkening of skin from radiation.  The swelling in her left lateral chest is much reduced. She does still have a firm area with well defined borders under the axillary incision but says that it is getting better   PALPATION: Small cords palpable in right antecubital area when she puts her arm a certain way POSTURE: forward head, rounded shoulders,   UPPER EXTREMITY AROM/PROM:   A/PROM RIGHT  11/12/2021   02/05/2022   Shoulder extension 65    Shoulder flexion 148 162   Shoulder abduction 170 175   Shoulder internal rotation 70    Shoulder external rotation 100                            (Blank rows = not tested)   A/PROM LEFT  11/12/2021  Shoulder extension 57  Shoulder flexion 150  Shoulder abduction 165  Shoulder internal rotation 70  Shoulder external rotation 85                          (Blank rows = not tested)   LYMPHEDEMA ASSESSMENTS:    [facility] RIGHT  11/12/2021 RIGHT 01/22/2022 02/28/2022 9/6/203 03/18/2022 04/03/22 04/14/22 04/22/2022 05/05/22 06/18/2022 07/02/22  10 cm proximal to olecranon process 32.3 33 33.2 33.2 33.2 31 31  31.7 30.5 30.5 30.4  Olecranon process 27.2 27 28  27.7 28 27.5 26.5 26.6 26.5 26.0 26  10 cm proximal to ulnar styloid process 23.7 24.3 24.0 23.5 25 23  23.4 23.4 23 23.3 23.5  Just proximal to ulnar styloid process 17.9 17.5 17.7 17.5 18.8 18.1 18.1 18.5 18 17.4 17.4  Across hand at thumb web space 19.9 20 19.9 20 20.3 20.5 20.6 21 20.5 20.5 21  At base of 2nd digit 6.9 7 6.8 6.8 6.9 7 7  7.1 7 6.75 6.7  (Blank rows =  not tested)   [facility] LEFT  11/12/2021 02/28/2022 04/22/2022  10 cm proximal to olecranon process 34.5 33.6 33.0  Olecranon process 27.5 27.5 27.3  10 cm proximal to  ulnar styloid process 23.8 23.6 23.6  Just proximal to ulnar styloid process 17.3 17.8 17.2  Across hand at thumb web space 18.8 19.3 19.0  At base of 2nd digit 6.7 6.9 7.0  (Blank rows = not tested)      TREATMENT TODAY: 07/22/2022 Performed MLD to right upper extremity short neck,, left axillary nodes, anterior interaxillary anastamosis, right inguinal nodes, right axillo-inguinal anastamosis and lateral trunk, right upper and lower arm to hand (extra time spent at hand where pt has increased edema) with return along pathways and ending with left axillary and right inguinal LN.  07/02/2022 Pt. Removed garments Pt remeasured. Her hand measurement has increased some and pt is having more edema at R lateral trunk. Performed MLD to right upper extremity short neck,, left axillary nodes, anterior interaxillary anastamosis, right inguinal nodes, right axillo-inguinal anastamosis and lateral trunk, right upper and lower arm to hand (extra time spent at hand where pt has increased edema) with return along pathways and ending with left axillary and right inguinal LN. Pt to be measured for custom glove right after this appt.   07/02/2022 Pt. Removed garments Pt remeasured. Her hand measurement has increased some and pt is having more edema at R lateral trunk. Performed MLD to right upper extremity short neck,, left axillary nodes, anterior interaxillary anastamosis, right inguinal nodes, right axillo-inguinal anastamosis and lateral trunk, right upper and lower arm to hand (extra time spent at hand where pt has increased edema) with return along pathways and ending with left axillary and right inguinal LN.  06/18/2022 Pt. Removed garments Pt remeasured. Improved result at wrist Performed MLD to right upper extremity short neck,, left axillary nodes, anterior interaxillary anastamosis, right inguinal nodes, right axillo-inguinal anastamosis and lateral trunk, right upper and lower arm to hand (extra  time spent at hand where pt has increased edema) with return along pathways and ending with left axillary and right inguinal LN.   05/15/22 Educated pt on need for a longer sleeve for increased coverage at wrist and upper arm. She is still having swelling on dorsum of hand which may be caused by lack of much overlap between her glove and sleeve. Educated pt that she may need a custom glove that goes down further on her forearm and has a pocket for foam on the dorsal surface to help decrease edema. Assisted pt with ordering a longer [employer] Sleeve.  erformed MLD to right upper extremity short neck,, left axillary nodes, anterior interaxillary anastamosis, right inguinal nodes, right axillo-inguinal anastamosis and lateral trunk, right upper and lower arm to hand (extra time spent at hand where pt has increased edema) with return along pathways and ending with left axillary and right inguinal LN.  05/06/22 Remeasured circumferences and pt's arm and hand have decreased throughout. Performed MLD to right upper extremity short neck,, left axillary nodes, anterior interaxillary anastamosis, right inguinal nodes, right axillo-inguinal anastamosis and lateral trunk, right upper and lower arm to hand (extra time spent at hand where pt has increased edema) with return along pathways and ending with left axillary and right inguinal LN. Assisted pt with redonning foam bracelet under edge of sleeve and pt place 1/2 grey foam at dorsum of hand in glove.   05/01/22 Pts garments removed. Larger pad staying in place a dorsum of  hand, but hand is still swollen Performed MLD to right upper extremity short neck,, left axillary nodes, anterior interaxillary anastamosis, right inguinal nodes, right axillo-inguinal anastamosis and lateral trunk, right upper and lower arm to hand (extra time spent at hand where pt has increased edema) with return along pathways and ending with left axillary and right inguinal  LN. Created a foam bracelet for pt to wear under end of sleeve to help reduce hand swelling 04/22/2022 Pts garments removed. Larger pad staying in place a dorsum of hand, but hand is still swollen Pt. Measured bilaterally In sitting:Pt performed  MLD to right upper extremity short neck,,5 breaths, left axillary nodes, anterior interaxillary anastamosis, right inguinal nodes, right axillo-inguinal anastamosis and  right upper and lower arm to hand  with return along pathways and ending with left axillary and right inguinal LN. Pt required multiple VC's throughout MLD for sequence/pressure  04/17/2022 Pts garments removed. Larger pad staying in place a dorsum of hand, but hand is still swollen Performed MLD to right upper extremity short neck,, left axillary nodes, anterior interaxillary anastamosis, right inguinal nodes, right axillo-inguinal anastamosis and lateral trunk, right upper and lower arm to hand (extra time spent at hand where pt has increased edema) with return along pathways and ending with left axillary and right inguinal LN. Reviewed sequence with pt verbally while performing. She has not heard from [employer] yet about Flexitouch. Management consultant again today. 04/14/2022 Pts garments removed and pt measured. Bogginess noted at posterior elbow. Pt continues with finger swelling. Therapist demonstrated finger wrap and had pt also do. She will try at night while wearing her night garment. New larger peach foam pad made for dorsum of hand/thumb area to place in glove Perfomed MLD to right upper extremity short neck,, left axillary nodes, anterior interaxillary anastamosis, right inguinal nodes, right axillo-inguinal anastamosis and lateral trunk, right upper and lower arm to hand (extra time spent at hand where pt has increased edema) with return along pathways and ending with left axillary and right inguinal LN. Pt left with sleeve and glove donned Discussed Flexi touch and pt is agreeable  to have benefits checked.  04/08/22 Redid SOZO today which is now at 22.3 "off the charts" which frustrated patient - seems to be in chronic lymphedema stage.  Made small lymph pad for in glove Perfomed MLD to right upper extremity short neck,, left axillary nodes, anterior interaxillary anastamosis, right inguinal nodes, right axillo-inguinal anastamosis and lateral trunk, right upper and lower arm to hand (extra time spent at hand where pt has increased edema) with return along pathways and ending with left axillary and right inguinal LN. Pt left with sleeve and glove donned  04/03/22 Measured pt for compression garments today. She fit in to a CircAid Profile size 3, a Medi [employer] size II sleeve and size II Medi [employer]. Garment order was placed using [employer] for night time garment and glove and pt purchased her sleeve. Educated pt that the sleeve and glove are to wear during day time and the profile is a night time garment. Remeasured circumferences and pt demonstrates a good decrease at forearm and upper arm today.  Pt wrapped with medium TG soft: 1/2 grey foam added to dorsum of R hand, elastomull to fingers 1-5 and covered with paper tape, artiflex, 6 cm to hand, 10 cm wrist to axilla in herringbone pattern, and 12 cm wrist to axilla.   03/31/2022 Pt arrived with sleeve and gauntlet donned. Educated pt  that the gauntlet is the reason her fingers are swelling worse because the fluid is being pushed in to them.  Perfomed MLD to right upper extremity short neck,, left axillary nodes, anterior interaxillary anastamosis, right inguinal nodes, right axillo-inguinal anastamosis and lateral trunk, right upper and lower arm to hand (extra time spent at hand where pt has increased edema) with return along pathways and ending with left axillary and right inguinal LN. Pt wrapped with medium TG soft: 1/2 grey foam added to dorsum of R hand, elastomull to fingers 1-5 and covered with paper  tape, artiflex, 6 cm to hand, 10 cm wrist to axilla in herringbone pattern, and 12 cm wrist to axilla. Pt reported increased comfort with 1/2 grey foam on back of hand.   PATIENT EDUCATION:  Education details: reviewed MLD , encouraged pt to wear compression bra  Person educated: Patient Education method: Explanation   Education comprehension: verbalized understanding and returned demonstration   ASSESSMENT:  CLINICAL IMPRESSION:    Continued with MLD to RUE today. Her hand swelling was down some today because she has not been wearing her sleeve and glove. She has been been measured for a new glove but has not heard about coverage for it at this point. She should be receiving the basic pump soon.   Pt will benefit from skilled therapeutic intervention to improve on the following deficits: Decreased knowledge of precautions, impaired UE functional use, pain, swelling,decreased ROM, postural dysfunction.   PT treatment/interventions: ADL/Self care home management, Therapeutic exercises, Therapeutic activity, Patient/Family education, Self Care, Orthotic/Fit training, DME instructions, Manual lymph drainage, Compression bandaging, scar mobilization, and Manual therapy     GOALS: Goals reviewed with patient? Yes  LONG TERM GOALS:  (STG=LTG)  GOALS Name Target Date  Goal status  1 Pt will demonstrate she has regained full shoulder ROM and function post operatively compared to baselines.  Baseline: 05/06/2022 MET  2 Pt will report the discomfort from fullness in her chest and right axilla is decreased by 50% 05/06/2022 MET  3 Pt will be independent in self MLD to the Right UE and trunk to decrease swelling 04/14/2022 MET  4 Pt will have no complaints of right UE tightness/tenderness from cording 04/14/2022 MET  5 Pts. SOZO score will be reduced to be within the green zone 05/12/2022 DEFERRED- pt has lymphedema and will not return to the green  6 Pt will note decreased swelling in dorsum of  hand with peach foam pad for glove 05/12/2022 05/15/22- Pt is still having increased swelling at dorsum of hand and peach foam pushes swelling in to her fingers; 07/02/22- hand has increased further- plan is to order custom glove in January  7 Pt will receive a custom compression glove with added compression at dorsum of hand and will receive a longer off the shelf sleeve.  06/12/22 ONGOING - 07/02/22- pt to be measured in January      PLAN: PT FREQUENCY/DURATION: 1x/week for  6 weeks   PLAN FOR NEXT SESSION:  Continue MLD to the right UE, how is longer sleeve? If she still has increased wrist and hand edema may need to proceed with custom glove - sent in basket message to pt to see if 07/16/22 would work at 1 for measurement    [employer], PT 07/22/2022, 2:59 PM   PHYSICAL THERAPY DISCHARGE SUMMARY  Visits from Start of Care: 23  Current functional level related to goals / functional outcomes: See above   Remaining deficits: See above  Education / Equipment: MLD, compression garments   Patient agrees to discharge. Patient goals were partially met. Patient is being discharged due to not returning since the last visit. Milagros Loll [place], [place] 09/22/23 12:01 PM

## 2022-07-23 ENCOUNTER — Encounter: Payer: Medicare PPO | Admitting: Physical Therapy

## 2022-07-28 DIAGNOSIS — I890 Lymphedema, not elsewhere classified: Secondary | ICD-10-CM | POA: Diagnosis not present

## 2022-07-30 ENCOUNTER — Encounter: Payer: Medicare PPO | Admitting: Physical Therapy

## 2022-08-06 ENCOUNTER — Encounter: Payer: Medicare PPO | Admitting: Physical Therapy

## 2022-08-08 ENCOUNTER — Other Ambulatory Visit: Payer: Self-pay | Admitting: Hematology and Oncology

## 2022-08-14 ENCOUNTER — Other Ambulatory Visit: Payer: Self-pay | Admitting: Pharmacy Technician

## 2022-11-08 NOTE — Progress Notes (Signed)
Patient Care Team: Carylon Perches, MD as PCP - General (Internal Medicine) Serena Croissant, MD as Consulting Physician (Hematology and Oncology) Lonie Peak, MD as Attending Physician (Radiation Oncology) Emelia Loron, MD as Consulting Physician (General Surgery)  DIAGNOSIS: No diagnosis found.  SUMMARY OF ONCOLOGIC HISTORY: Oncology History  Malignant neoplasm of upper-outer quadrant of right breast in female, estrogen receptor positive (HCC)  10/23/2021 Initial Diagnosis   Screening mammogram detected abnormalities in the right breast: 12 o'clock position 0.7 cm: Biopsy: Grade 1 IDC with ALH ER 90%, PR 95%, Ki-67 less than 1%, HER2 1+, the other 2 biopsies were benign (fibrocystic change and focal ADH and ALH)   11/12/2021 Cancer Staging   Staging form: Breast, AJCC 8th Edition - Clinical: Stage IA (cT1b, cN0, cM0, G1, ER+, PR+, HER2-) - Signed by Serena Croissant, MD on 11/12/2021 Stage prefix: Initial diagnosis Histologic grading system: 3 grade system   11/12/2021 Genetic Testing    VUS Dicer1 gene, p.1907T. Genes tested: The CancerNext gene panel offered by [employer] includes sequencing and rearrangement analysis for the following 34 genes:   APC, ATM, BARD1, BMPR1A, BRCA1, BRCA2, BRIP1, CDH1, CDK4, CDKN2A, CHEK2, DICER1, HOXB13, EPCAM, GREM1, MLH1, MRE11A, MSH2, MSH6, MUTYH, NBN, NF1, PALB2, PMS2, POLD1, POLE, PTEN, RAD50, RAD51C, RAD51D, SMAD4, SMARCA4, STK11, and TP53.     12/17/2021 Surgery   Right lumpectomy: Grade 1 IDC 10 mm, margins negative, 0/1 lymph node negative, ER 90%, PR 95%, HER2 negative 1+, Ki-67 less than 1% Right lumpectomy: Invasive cribriform carcinoma, grade 1, 2 mm, DCIS grade 2 8 mm, margins negative, ER 100%, PR 100%, Ki-67 5%, HER2 1+ negative   01/22/2022 - 02/12/2022 Radiation Therapy   Site Technique Total Dose (Gy) Dose per Fx (Gy) Completed Fx Beam Energies  Breast, Right: Breast_R 3D 42.56/42.56 2.66 16/16 10X     02/2022 -  Anti-estrogen oral  therapy   2.5 mg Letrozole x 5-7 years     CHIEF COMPLIANT: Follow-up right breast cancer and Letrozole   INTERVAL HISTORY: Christy Flores is a 72 y.o with right breast cancer. She presents to the clinic for a follow-up.     ALLERGIES:  is allergic to codeine.  MEDICATIONS:  Current Outpatient Medications  Medication Sig Dispense Refill   acetaminophen (TYLENOL) 500 MG tablet Take 500 mg by mouth every 6 (six) hours as needed for moderate pain. (Patient not taking: Reported on 05/15/2022)     BIOTIN PO Take 1 tablet by mouth daily. (Patient not taking: Reported on 05/15/2022)     CALCIUM PO Take 1 tablet by mouth daily. (Patient not taking: Reported on 05/15/2022)     citalopram (CELEXA) 20 MG tablet Take 20 mg by mouth daily.     Cyanocobalamin (B-12 PO) Take 1 tablet by mouth daily as needed (energy). (Patient not taking: Reported on 05/15/2022)     cycloSPORINE (RESTASIS) 0.05 % ophthalmic emulsion Place 1 drop into both eyes 2 (two) times daily as needed (dry eyes).     denosumab (PROLIA) 60 MG/ML SOSY injection Inject 60 mg into the skin every 6 (six) months.     letrozole (FEMARA) 2.5 MG tablet TAKE 1 TABLET(2.5 MG) BY MOUTH DAILY 90 tablet 3   Multiple Vitamins-Minerals (MULTIVITAMIN WITH MINERALS) tablet Take 1 tablet by mouth daily. (Patient not taking: Reported on 05/15/2022)     naproxen sodium (ALEVE) 220 MG tablet Take 220 mg by mouth daily as needed (pain). (Patient not taking: Reported on 05/15/2022)     Polyvinyl  Alcohol-Povidone (REFRESH OP) Place 1 drop into both eyes daily as needed (dry eyes).     traMADol (ULTRAM) 50 MG tablet Take 2 tablets (100 mg total) by mouth every 6 (six) hours as needed. (Patient not taking: Reported on 05/15/2022) 10 tablet 0   TURMERIC PO Take 1 capsule by mouth daily. (Patient not taking: Reported on 05/15/2022)     VITAMIN D PO Take 1 tablet by mouth daily. (Patient not taking: Reported on 05/15/2022)     No current facility-administered  medications for this visit.    PHYSICAL EXAMINATION: ECOG PERFORMANCE STATUS: {CHL ONC ECOG PS:[phone]}  There were no vitals filed for this visit. There were no vitals filed for this visit.  BREAST:*** No palpable masses or nodules in either right or left breasts. No palpable axillary supraclavicular or infraclavicular adenopathy no breast tenderness or nipple discharge. (exam performed in the presence of a chaperone)  LABORATORY DATA:  I have reviewed the data as listed    Latest Ref Rng & Units 06/25/2020   12:40 PM 05/23/2008   11:45 AM  CMP  Glucose 70 - 99 mg/dL 98  97   BUN 8 - 23 mg/dL 14  11   Creatinine 1.61 - 1.00 mg/dL 0.96  0.45   Sodium 409 - 145 mmol/L 139  138   Potassium 3.5 - 5.1 mmol/L 4.3  4.2   Chloride 98 - 111 mmol/L 103  102   CO2   29   Calcium   9.2     Lab Results  Component Value Date   WBC 5.1 12/11/2021   HGB 13.5 12/11/2021   HCT 41.3 12/11/2021   MCV 99.3 12/11/2021   PLT 214 12/11/2021    ASSESSMENT & PLAN:  No problem-specific Assessment & Plan notes found for this encounter.    No orders of the defined types were placed in this encounter.  The patient has a good understanding of the overall plan. she agrees with it. she will call with any problems that may develop before the next visit here. Total time spent: 30 mins including face to face time and time spent for planning, charting and co-ordination of care   Sherlyn Lick, CMA 11/08/22    I Janan Ridge am acting as a Neurosurgeon for [employer]  ***

## 2022-11-13 ENCOUNTER — Other Ambulatory Visit: Payer: Self-pay

## 2022-11-13 ENCOUNTER — Inpatient Hospital Stay: Payer: Medicare PPO | Attending: Hematology and Oncology | Admitting: Hematology and Oncology

## 2022-11-13 VITALS — BP 124/70 | HR 65 | Temp 97.3°F | Resp 16 | Ht 64.0 in | Wt 173.8 lb

## 2022-11-13 DIAGNOSIS — Z170 Estrogen receptor positive status [ER+]: Secondary | ICD-10-CM | POA: Diagnosis not present

## 2022-11-13 DIAGNOSIS — C50411 Malignant neoplasm of upper-outer quadrant of right female breast: Secondary | ICD-10-CM | POA: Diagnosis not present

## 2022-11-13 DIAGNOSIS — Z79811 Long term (current) use of aromatase inhibitors: Secondary | ICD-10-CM | POA: Insufficient documentation

## 2022-11-13 NOTE — Assessment & Plan Note (Addendum)
12/17/2021:Right lumpectomy: Grade 1 IDC 10 mm, margins negative, 0/1 lymph node negative, ER 90%, PR 95%, HER2 negative 1+, Ki-67 less than 1% Right lumpectomy: Invasive cribriform carcinoma, grade 1, 2 mm, DCIS grade 2 8 mm, margins negative, ER 100%, PR 100%, Ki-67 5%, HER2 1+ negative Adjuvant radiation: 01/23/2022-02/12/2022 ------------------------------------------------------------------------------------------------------------------------------------------------------ Current treatment: letrozole 2.5 mg daily started August 2023   Letrozole toxicities: Hair loss Muscle aches and pains Fatigue I recommended that she stop letrozole at this time. We can discuss switching her antiestrogen therapy once she starts to feel better.  Osteoporosis: 03/03/2022: T-score -2.2 (February 2020 it was T-score of -2.5).  She also had a couple of fractures in her feet.  She is currently on Prolia every 6 months   Telephone visit in 1 month to discuss if her symptoms have improved and what would the next antiestrogen therapy be. Her husband has a feeding tube and is scheduled to undergo surgery towards the end of May.  Breast cancer surveillance: Breast exam 11/13/2022: Benign Mammogram: Will be scheduled at the end of May in [place] in [place].

## 2022-11-27 DIAGNOSIS — I890 Lymphedema, not elsewhere classified: Secondary | ICD-10-CM | POA: Diagnosis not present

## 2022-12-10 NOTE — Progress Notes (Signed)
HEMATOLOGY-ONCOLOGY TELEPHONE VISIT PROGRESS NOTE  I connected with our patient on 12/24/22 at 11:15 AM EDT by telephone and verified that I am speaking with the correct person using two identifiers.  I discussed the limitations, risks, security and privacy concerns of performing an evaluation and management service by telephone and the availability of in person appointments.  I also discussed with the patient that there may be a patient responsible charge related to this service. The patient expressed understanding and agreed to proceed.   History of Present Illness: Christy Flores is a 72 y.o with right breast cancer. She presents to the clinic for a telephone follow-up to discuss if her symptoms have improved.  She reports that most of the musculoskeletal aches and pains have resolved after stopping letrozole.  She feels significantly better.  Oncology History  Malignant neoplasm of upper-outer quadrant of right breast in female, estrogen receptor positive (HCC)  10/23/2021 Initial Diagnosis   Screening mammogram detected abnormalities in the right breast: 12 o'clock position 0.7 cm: Biopsy: Grade 1 IDC with ALH ER 90%, PR 95%, Ki-67 less than 1%, HER2 1+, the other 2 biopsies were benign (fibrocystic change and focal ADH and ALH)   11/12/2021 Cancer Staging   Staging form: Breast, AJCC 8th Edition - Clinical: Stage IA (cT1b, cN0, cM0, G1, ER+, PR+, HER2-) - Signed by Serena Croissant, MD on 11/12/2021 Stage prefix: Initial diagnosis Histologic grading system: 3 grade system   11/12/2021 Genetic Testing    VUS Dicer1 gene, p.1907T. Genes tested: The CancerNext gene panel offered by [employer] includes sequencing and rearrangement analysis for the following 34 genes:   APC, ATM, BARD1, BMPR1A, BRCA1, BRCA2, BRIP1, CDH1, CDK4, CDKN2A, CHEK2, DICER1, HOXB13, EPCAM, GREM1, MLH1, MRE11A, MSH2, MSH6, MUTYH, NBN, NF1, PALB2, PMS2, POLD1, POLE, PTEN, RAD50, RAD51C, RAD51D, SMAD4, SMARCA4, STK11, and TP53.      12/17/2021 Surgery   Right lumpectomy: Grade 1 IDC 10 mm, margins negative, 0/1 lymph node negative, ER 90%, PR 95%, HER2 negative 1+, Ki-67 less than 1% Right lumpectomy: Invasive cribriform carcinoma, grade 1, 2 mm, DCIS grade 2 8 mm, margins negative, ER 100%, PR 100%, Ki-67 5%, HER2 1+ negative   01/22/2022 - 02/12/2022 Radiation Therapy   Site Technique Total Dose (Gy) Dose per Fx (Gy) Completed Fx Beam Energies  Breast, Right: Breast_R 3D 42.56/42.56 2.66 16/16 10X     02/2022 -  Anti-estrogen oral therapy   2.5 mg Letrozole x 5-7 years     REVIEW OF SYSTEMS:   Constitutional: Denies fevers, chills or abnormal weight loss All other systems were reviewed with the patient and are negative. Observations/Objective:     Assessment Plan:  Malignant neoplasm of upper-outer quadrant of right breast in female, estrogen receptor positive (HCC) 12/17/2021:Right lumpectomy: Grade 1 IDC 10 mm, margins negative, 0/1 lymph node negative, ER 90%, PR 95%, HER2 negative 1+, Ki-67 less than 1% Right lumpectomy: Invasive cribriform carcinoma, grade 1, 2 mm, DCIS grade 2 8 mm, margins negative, ER 100%, PR 100%, Ki-67 5%, HER2 1+ negative Adjuvant radiation: 01/23/2022-02/12/2022 ------------------------------------------------------------------------------------------------------------------------------------------------------ Current treatment: letrozole 2.5 mg daily started August 2023 discontinued May 2024 due to musculoskeletal aches and pains Patient symptoms resolved after stopping letrozole. Therefore I recommended switching her to anastrozole.  I sent a 30-day supply of anastrozole.   Osteoporosis: 03/03/2022: T-score -2.2 (February 2020 it was T-score of -2.5).  She also had a couple of fractures in her feet.  She is currently on Prolia every 6 months  Her husband has a feeding tube and is scheduled to undergo surgery towards the end of May.   Breast cancer surveillance: Breast exam  12/24/2022: Benign Mammogram: We have sent an order for the mammogram to be done at [facility] in [place].  She will call and get that scheduled.  1 month telephone visit to discuss tolerance to anastrozole  I discussed the assessment and treatment plan with the patient. The patient was provided an opportunity to ask questions and all were answered. The patient agreed with the plan and demonstrated an understanding of the instructions. The patient was advised to call back or seek an in-person evaluation if the symptoms worsen or if the condition fails to improve as anticipated.   I provided 12 minutes of non-face-to-face time during this encounter.  This includes time for charting and coordination of care   Tamsen Meek, MD  I Janan Ridge am acting as a scribe for Dr.Sharrieff Spratlin  I have reviewed the above documentation for accuracy and completeness, and I agree with the above.

## 2022-12-24 ENCOUNTER — Inpatient Hospital Stay: Payer: Medicare PPO | Attending: Hematology and Oncology | Admitting: Hematology and Oncology

## 2022-12-24 DIAGNOSIS — Z170 Estrogen receptor positive status [ER+]: Secondary | ICD-10-CM | POA: Diagnosis not present

## 2022-12-24 DIAGNOSIS — C50411 Malignant neoplasm of upper-outer quadrant of right female breast: Secondary | ICD-10-CM

## 2022-12-24 MED ORDER — ANASTROZOLE 1 MG PO TABS: 1.0000 mg | ORAL_TABLET | Freq: Every day | ORAL | 0 refills | Status: DC

## 2022-12-24 NOTE — Assessment & Plan Note (Signed)
12/17/2021:Right lumpectomy: Grade 1 IDC 10 mm, margins negative, 0/1 lymph node negative, ER 90%, PR 95%, HER2 negative 1+, Ki-67 less than 1% Right lumpectomy: Invasive cribriform carcinoma, grade 1, 2 mm, DCIS grade 2 8 mm, margins negative, ER 100%, PR 100%, Ki-67 5%, HER2 1+ negative Adjuvant radiation: 01/23/2022-02/12/2022 ------------------------------------------------------------------------------------------------------------------------------------------------------ Current treatment: letrozole 2.5 mg daily started August 2023   Letrozole toxicities: Hair loss Muscle aches and pains Fatigue I recommended that she stop letrozole at this time. We can discuss switching her antiestrogen therapy once she starts to feel better.   Osteoporosis: 03/03/2022: T-score -2.2 (February 2020 it was T-score of -2.5).  She also had a couple of fractures in her feet.  She is currently on Prolia every 6 months    Telephone visit in 1 month to discuss if her symptoms have improved and what would the next antiestrogen therapy be. Her husband has a feeding tube and is scheduled to undergo surgery towards the end of May.   Breast cancer surveillance: Breast exam 12/24/2022: Benign Mammogram: Will be scheduled at the end of May in [place] in [place].

## 2022-12-25 ENCOUNTER — Other Ambulatory Visit (HOSPITAL_COMMUNITY): Payer: Self-pay | Admitting: Hematology and Oncology

## 2022-12-25 DIAGNOSIS — Z853 Personal history of malignant neoplasm of breast: Secondary | ICD-10-CM

## 2022-12-27 ENCOUNTER — Telehealth: Payer: Self-pay | Admitting: Hematology and Oncology

## 2022-12-27 NOTE — Telephone Encounter (Signed)
Scheduled appointment per 6/19 los. Patient is aware of the made appointment. 

## 2022-12-28 DIAGNOSIS — I890 Lymphedema, not elsewhere classified: Secondary | ICD-10-CM | POA: Diagnosis not present

## 2023-01-03 IMAGING — MG MM PLC BREAST LOC DEV 1ST LESION INC*R*
7 of 15 series · 7 of 15 positions shown · non-contrast
Comparison: With priors.

CLINICAL DATA: 71-year-old female who has undergone multiple right
biopsies.

[R ML]
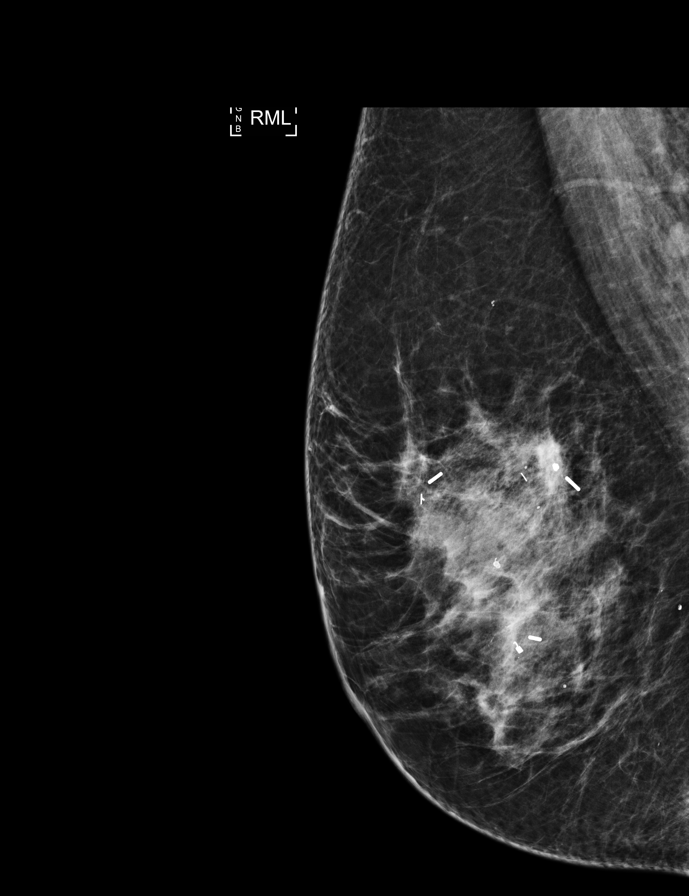

[R CC (1 of 5)]
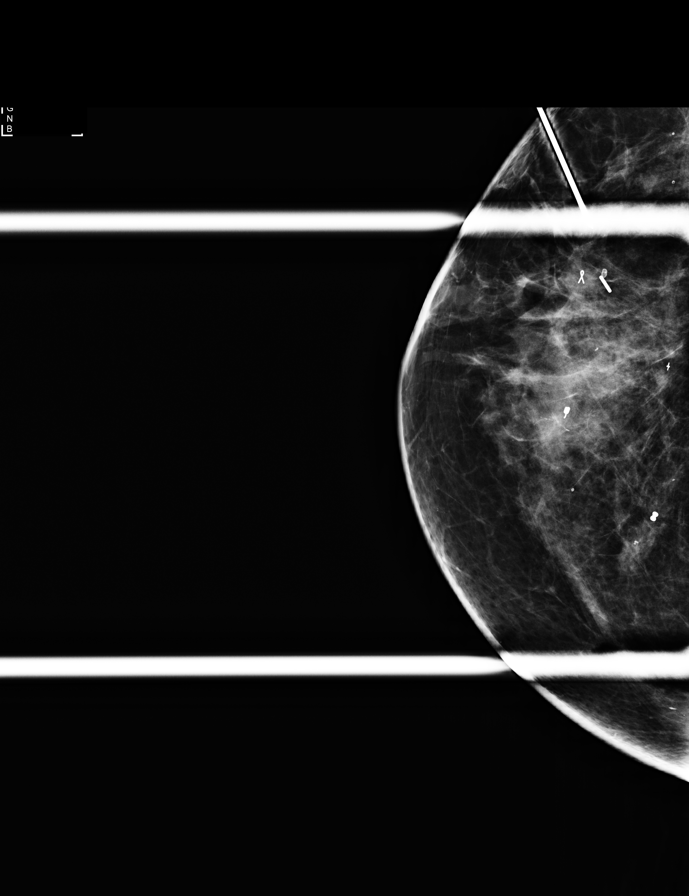

[R CC (2 of 5)]
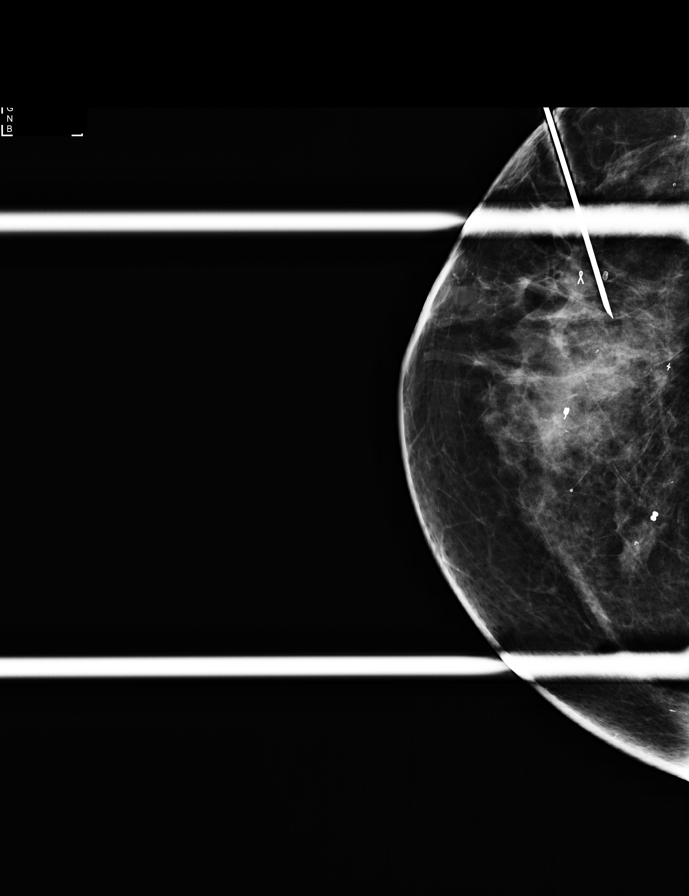

[R CC (3 of 5)]
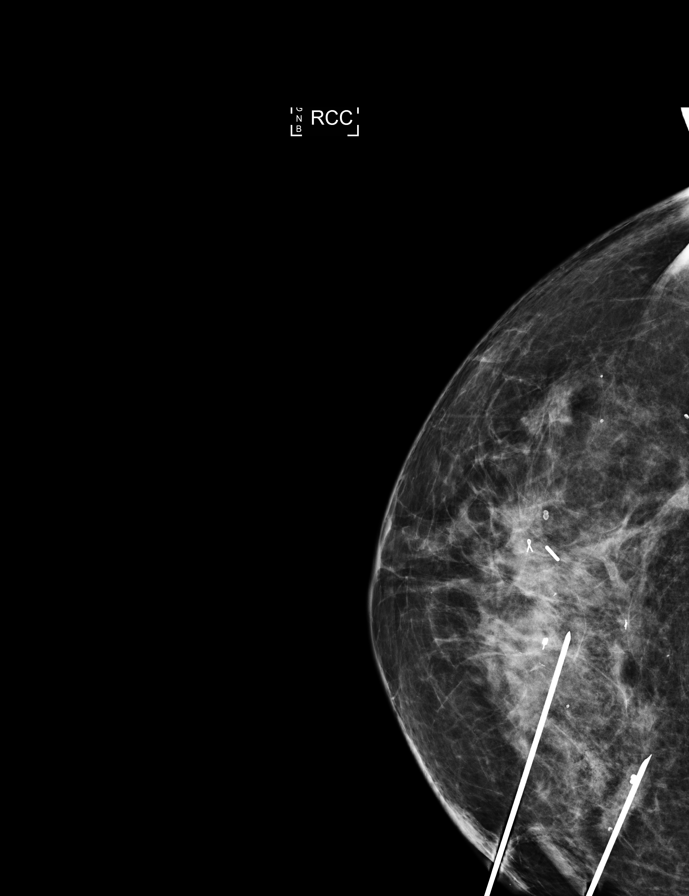

[R CC (4 of 5)]
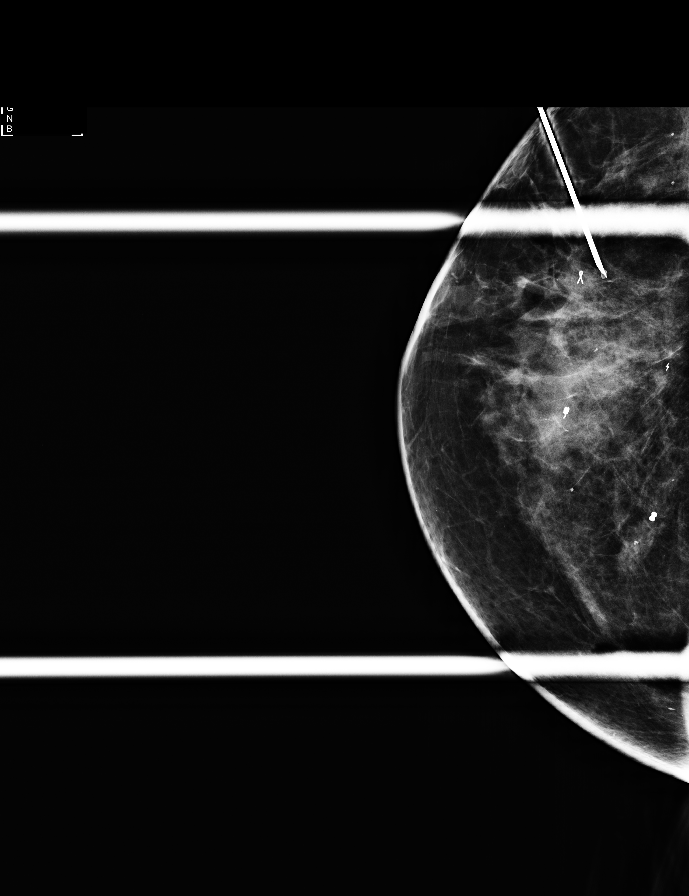

[R LM]
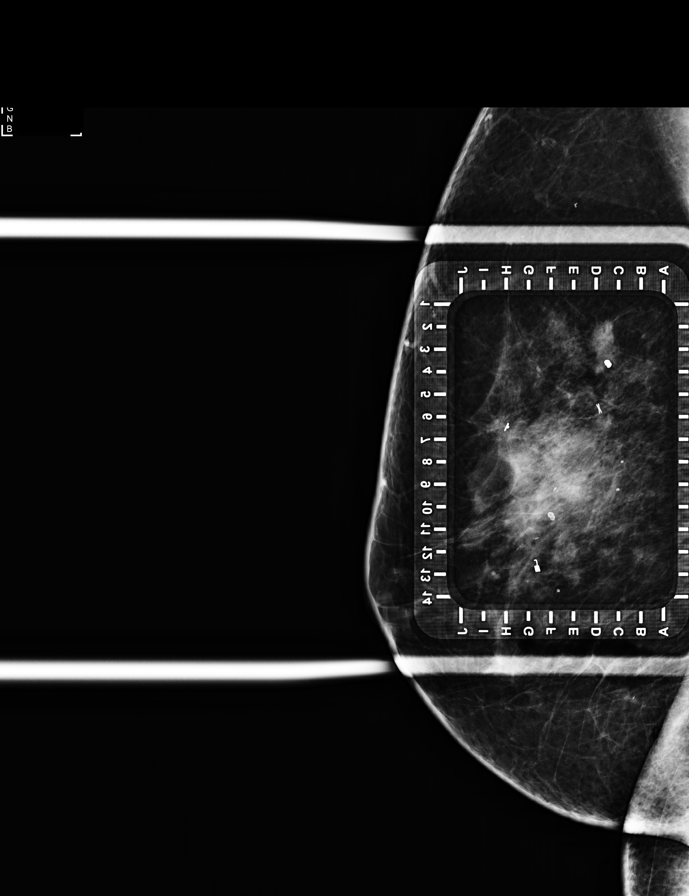

[R CC (5 of 5)]
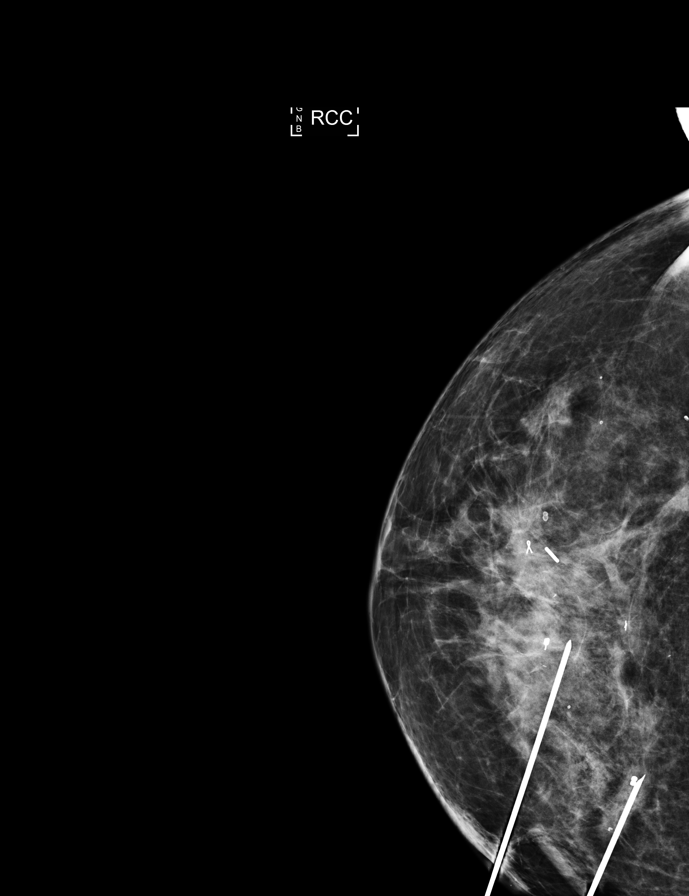

[7 of 15 positions shown; findings below may reference images not displayed]

1. Ribbon shaped clip- invasive ductal carcinoma and focal atypical
lobular hyperplasia.

2. Coil shaped clip-atypical ductal hyperplasia and calcifications,
atypical lobular hyperplasia, negative for invasive carcinoma.

3. Barbell shaped clip-lobular neoplasia (atypical lobular
hyperplasia).

4.  X shaped clip-fibrocystic change with calcifications/benign.

EXAM:
MAMMOGRAPHIC GUIDED RADIOACTIVE SEED LOCALIZATIONS OF THE RIGHT
BREAST
FINDINGS: Patient presents for radioactive seed localization prior to surgery.
I met with the patient and we discussed the procedure of seed
localization including benefits and alternatives. We discussed the
high likelihood of a successful procedure. We discussed the risks of
the procedure including infection, bleeding, tissue injury and
further surgery. We discussed the low dose of radioactivity involved
in the procedure. Informed, written consent was given.

The usual time-out protocol was performed immediately prior to the
procedure.

Using mammographic guidance, sterile technique, 1% lidocaine and an
Q-G0Z radioactive seed, the ribbon shaped clip was localized using a
lateral to medial approach. The follow-up mammogram images confirm
the seed in the expected location and were marked for Dr. Besford.

Follow-up survey of the patient confirms presence of the radioactive
seed.

Order number of Q-G0Z seed:  [id number].

Total activity:  0.246 millicuries reference Date: 11/25/2021

The patient tolerated the procedure well and was released from the
[REDACTED]. She was given instructions regarding seed removal.

Patient presents for radioactive seed localization prior to surgery.
I met with the patient and we discussed the procedure of seed
localization including benefits and alternatives. We discussed the
high likelihood of a successful procedure. We discussed the risks of
the procedure including infection, bleeding, tissue injury and
further surgery. We discussed the low dose of radioactivity involved
in the procedure. Informed, written consent was given.

The usual time-out protocol was performed immediately prior to the
procedure.

Using mammographic guidance, sterile technique, 1% lidocaine and an
Q-G0Z radioactive seed, coil shaped clip was localized using a
medial to lateral approach. The follow-up mammogram images confirm
the seed in the expected location and were marked for Dr. Besford.

Follow-up survey of the patient confirms presence of the radioactive
seed.

Order number of Q-G0Z seed:  [id number].

Total activity:  0.248 millicuries reference Date: 12/03/2021

The patient tolerated the procedure well and was released from the
[REDACTED]. She was given instructions regarding seed removal.

Patient presents for radioactive seed localization prior to surgery.
I met with the patient and we discussed the procedure of seed
localization including benefits and alternatives. We discussed the
high likelihood of a successful procedure. We discussed the risks of
the procedure including infection, bleeding, tissue injury and
further surgery. We discussed the low dose of radioactivity involved
in the procedure. Informed, written consent was given.

The usual time-out protocol was performed immediately prior to the
procedure.

Using mammographic guidance, sterile technique, 1% lidocaine and an
Q-G0Z radioactive seed, barbell shaped clip was localized using a
medial to lateral approach. The follow-up mammogram images confirm
the seed in the expected location and were marked for Dr. Besford.

Follow-up survey of the patient confirms presence of the radioactive
seed.

Order number of Q-G0Z seed:  [id number].

Total activity:  0.246 millicuries reference Date: 11/25/2021

The patient tolerated the procedure well and was released from the
[REDACTED]. She was given instructions regarding seed removal.
IMPRESSION: Radioactive seed localizations of the right breast. No apparent
complications.

## 2023-01-04 IMAGING — MG MM BREAST SURGICAL SPECIMEN
1 series · 2 of 2 positions shown · non-contrast
Comparison: None Available.

CLINICAL DATA: Patient status post right breast lumpectomy.

EXAM:
SPECIMEN RADIOGRAPH OF THE RIGHT BREAST

[Series 2: R · right · 0.07mm/px · 2 of 2 slices shown]
[im 1/2]
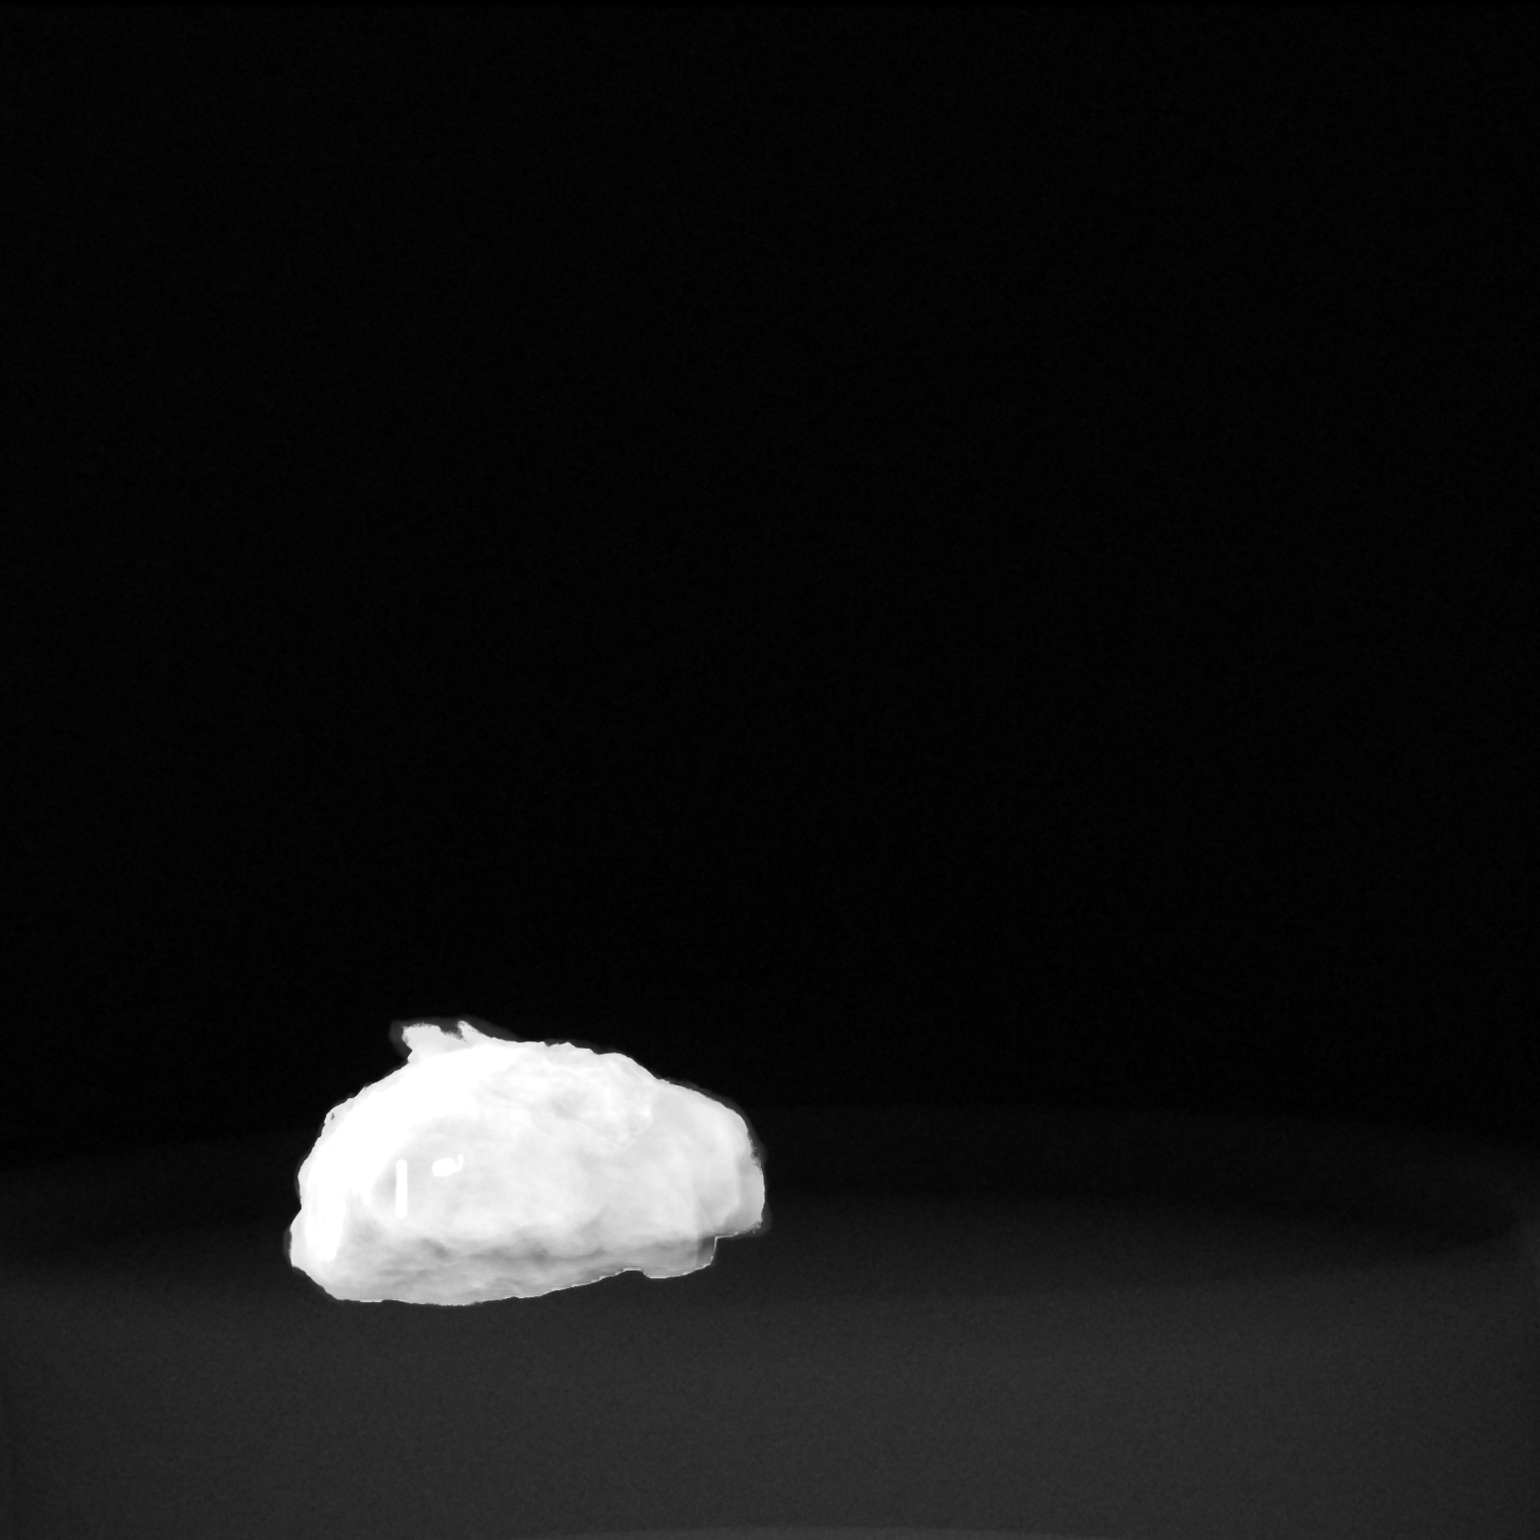
[im 2/2]
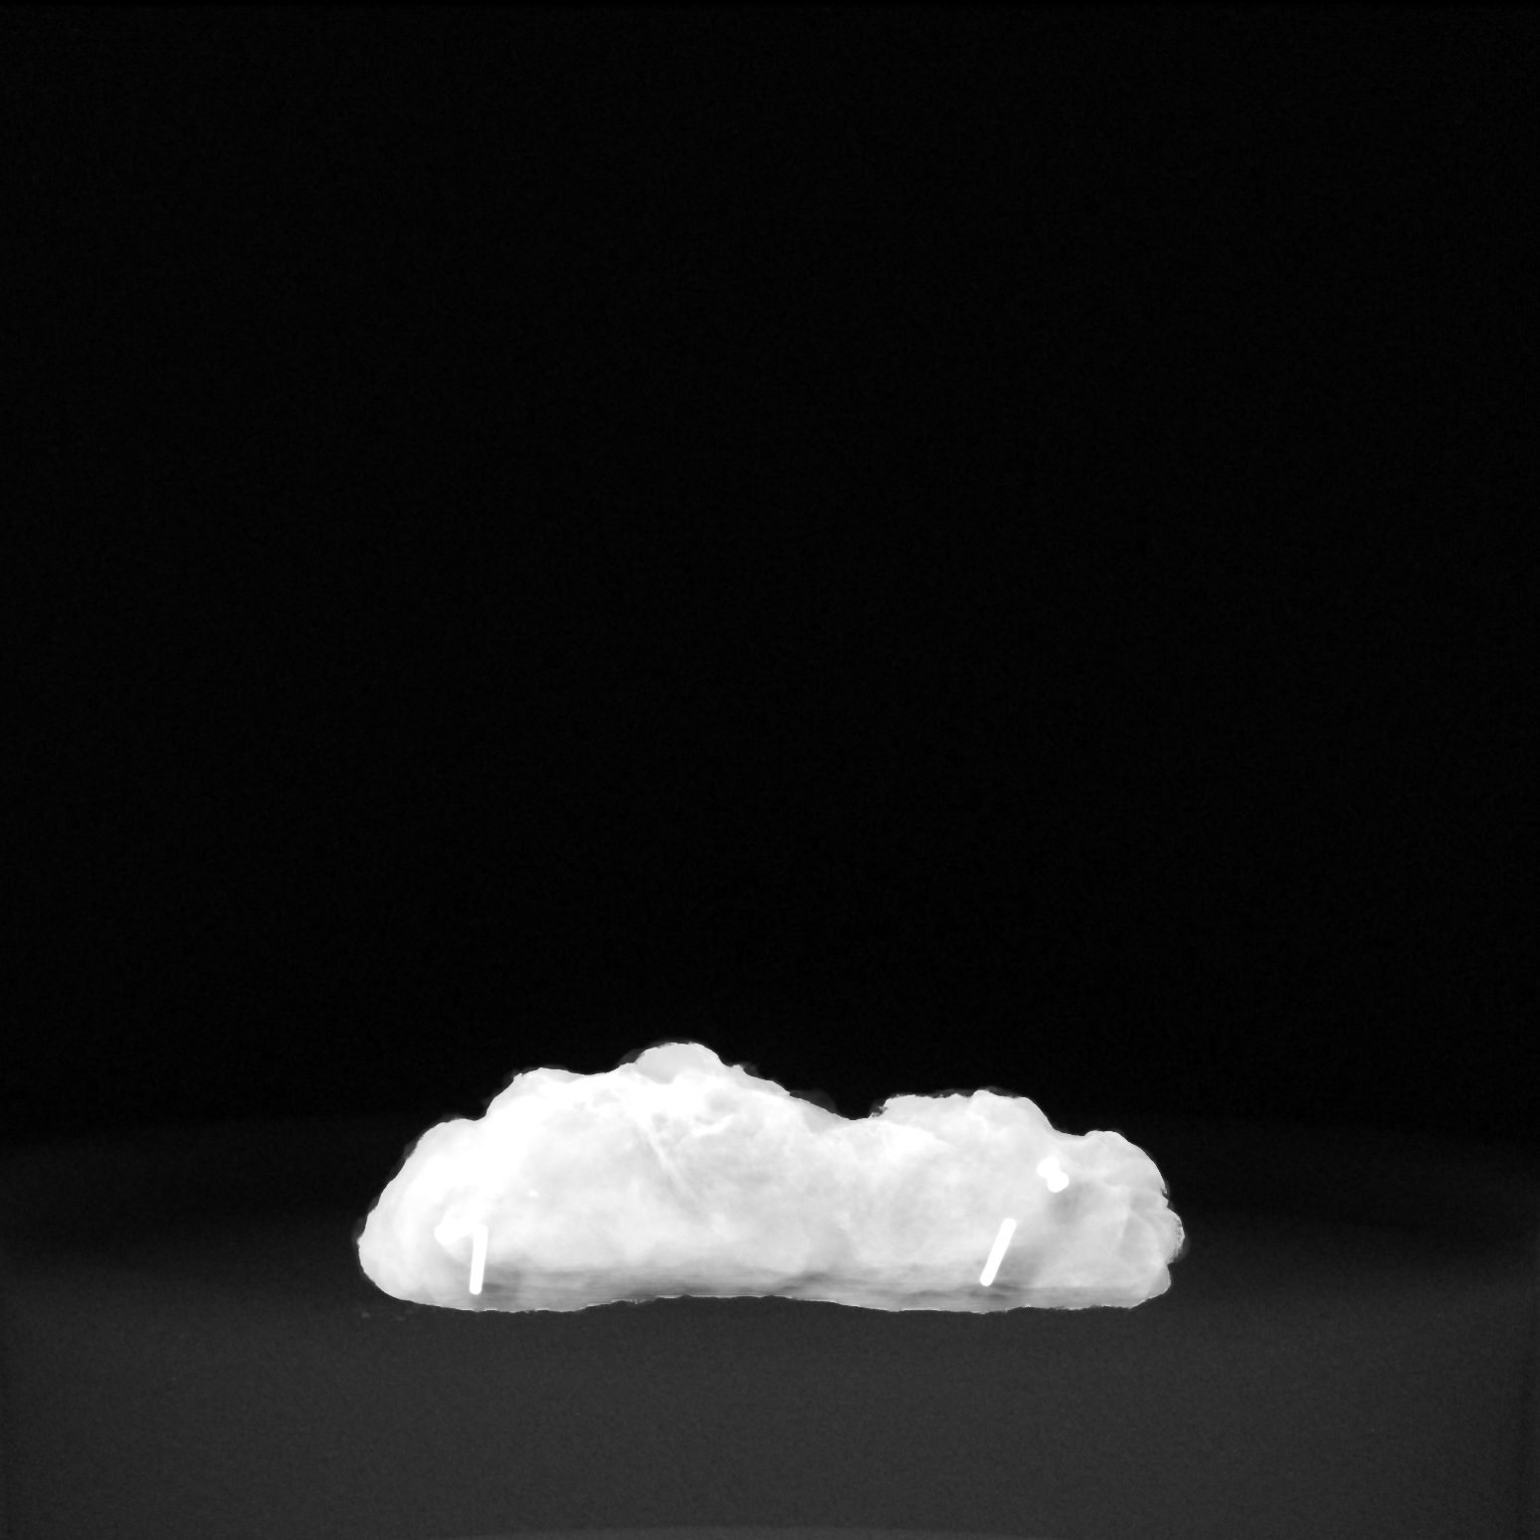

[2 of 2 positions shown; findings below may reference images not displayed]

FINDINGS: Status post excision of the right breast. The radioactive seeds and
biopsy marker clips are present and completely intact. Note the
ribbon clip, X clip, barbell clip and coil clip were all localized
within the specimen. Additionally all 3 radioactive seeds were
identified.
IMPRESSION: Specimen radiograph of the right breast.

## 2023-01-04 IMAGING — MG MM BREAST SURGICAL SPECIMEN
1 series · 2 of 2 positions shown · non-contrast
Comparison: None Available.

CLINICAL DATA: Patient status post right breast lumpectomy.

EXAM:
SPECIMEN RADIOGRAPH OF THE RIGHT BREAST

[Series 1: R · right · 0.07mm/px · 2 of 2 slices shown]
[im 1/2]
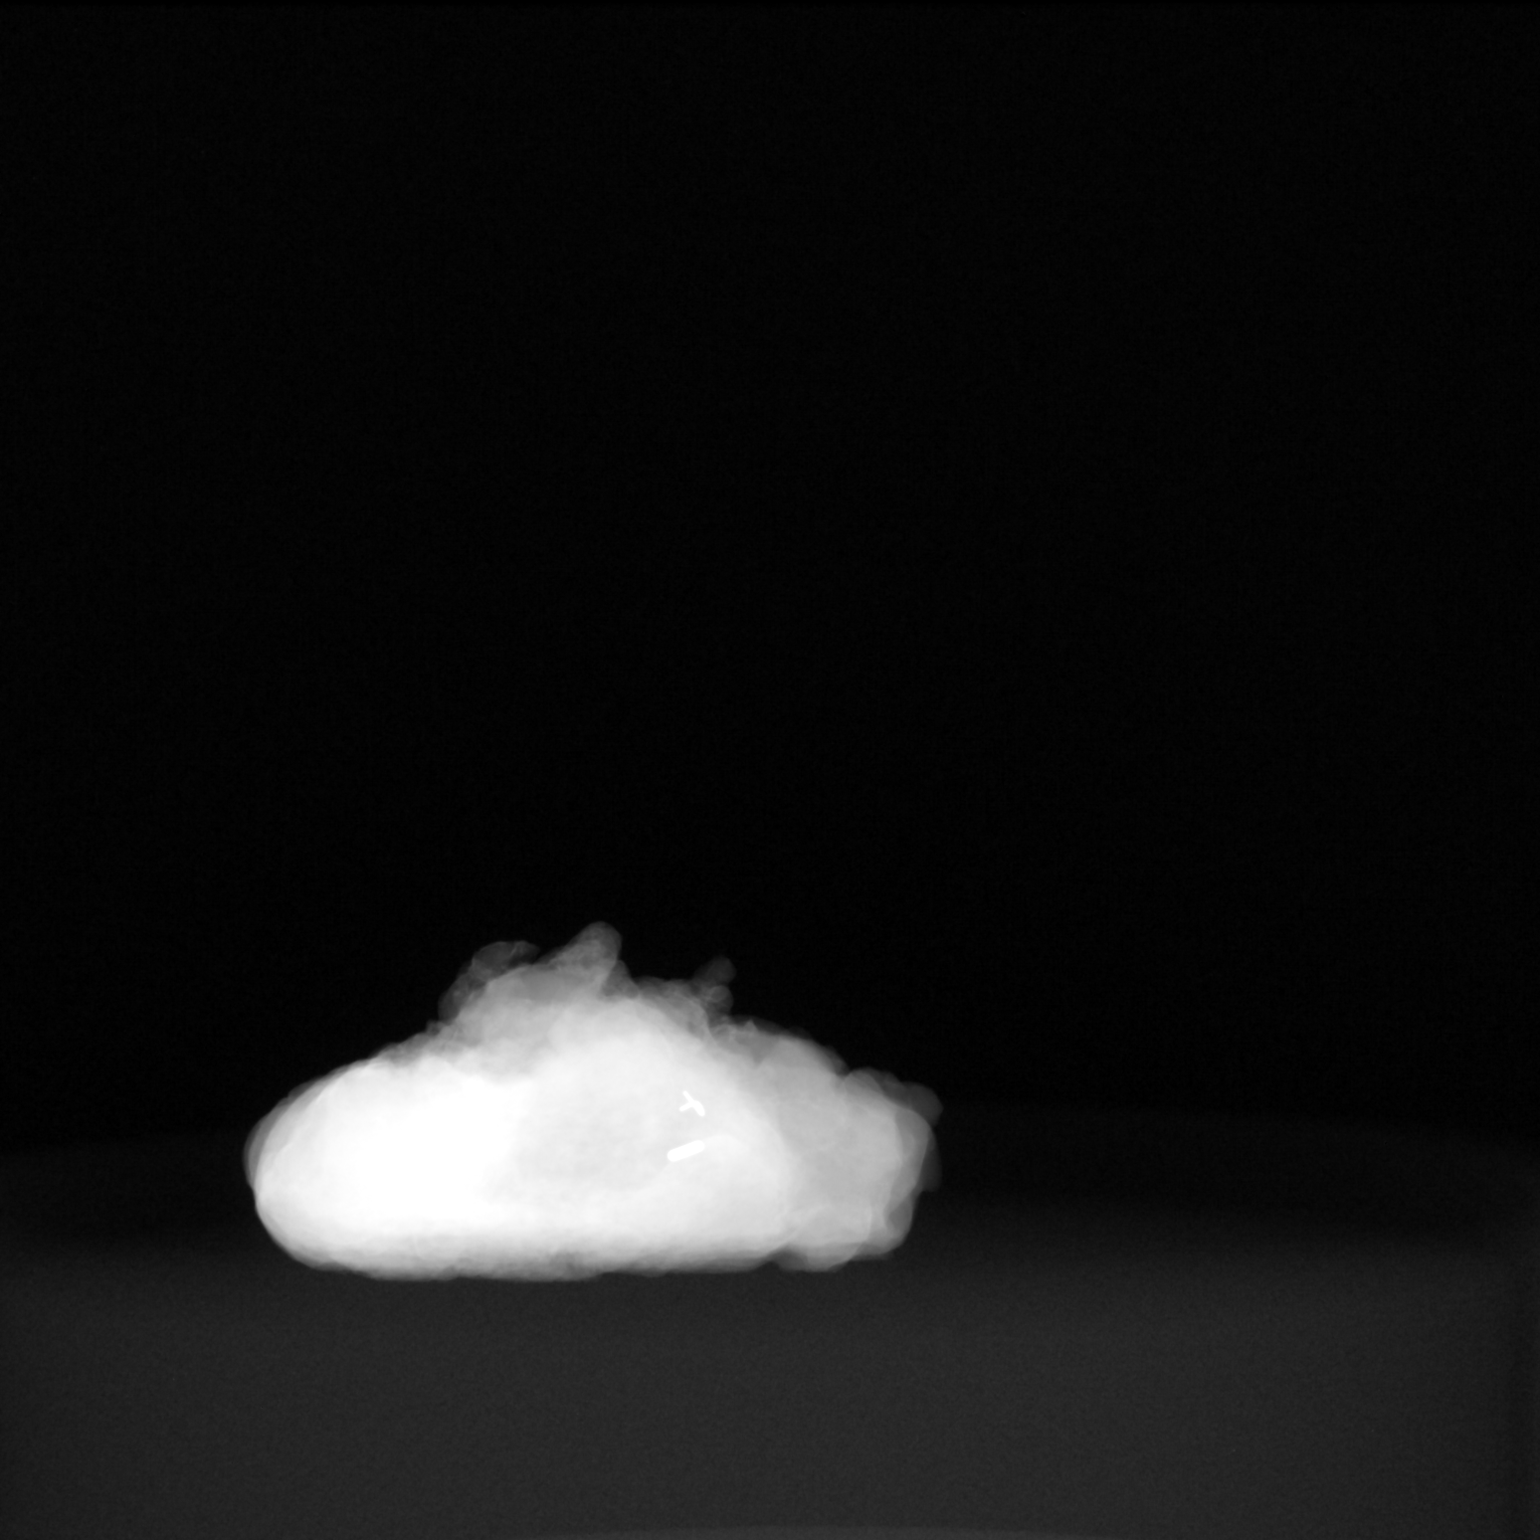
[im 2/2]
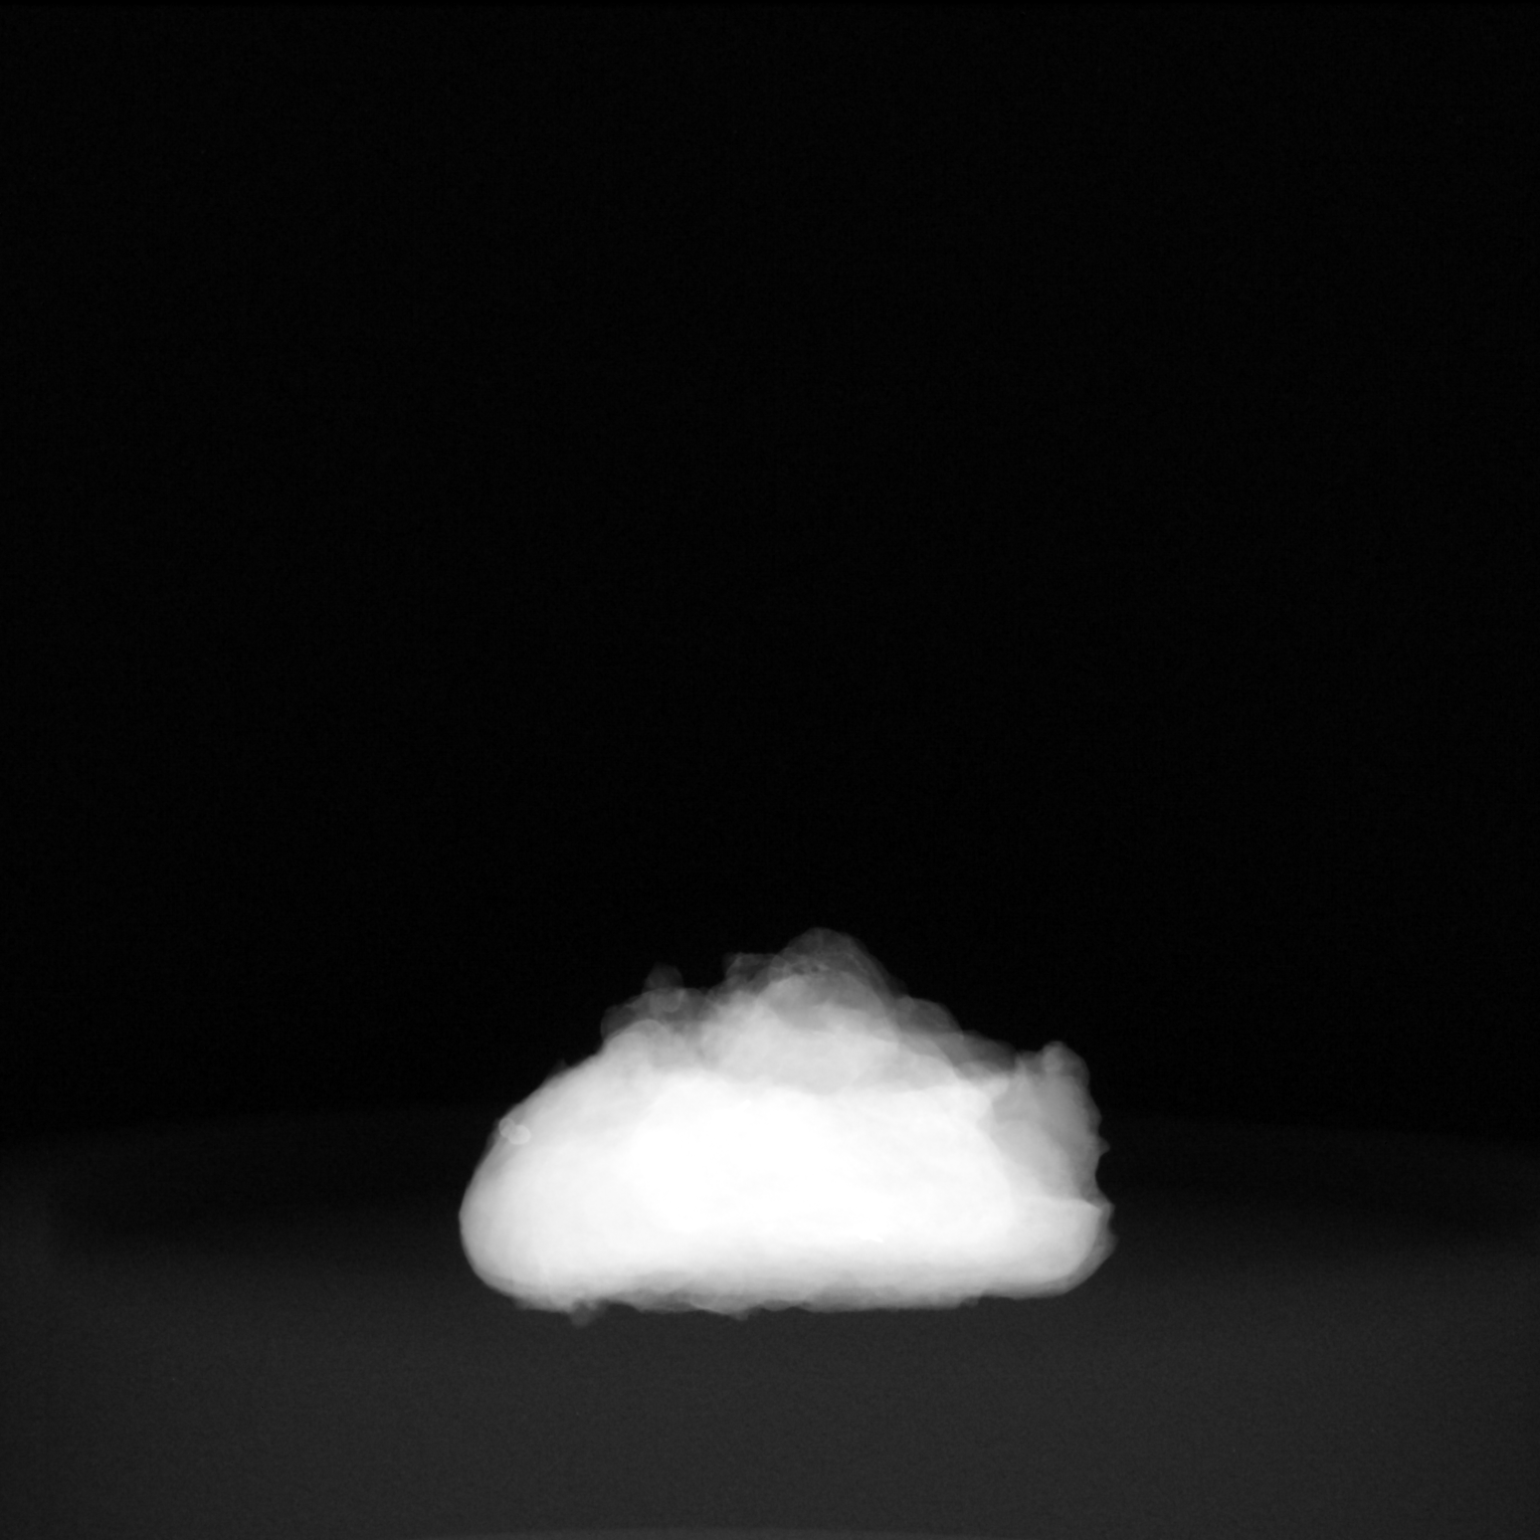

[2 of 2 positions shown; findings below may reference images not displayed]

FINDINGS: Status post excision of the right breast. The radioactive seeds and
biopsy marker clips are present and completely intact. Note the
ribbon clip, X clip, barbell clip and coil clip were all localized
within the specimen. Additionally all 3 radioactive seeds were
identified.
IMPRESSION: Specimen radiograph of the right breast.

## 2023-01-13 ENCOUNTER — Ambulatory Visit (HOSPITAL_COMMUNITY)
Admission: RE | Admit: 2023-01-13 | Discharge: 2023-01-13 | Disposition: A | Discharge status: Home / Self Care | Payer: Medicare PPO | Source: Ambulatory Visit | Attending: Hematology and Oncology | Admitting: Hematology and Oncology

## 2023-01-13 DIAGNOSIS — Z853 Personal history of malignant neoplasm of breast: Secondary | ICD-10-CM

## 2023-01-13 DIAGNOSIS — Z170 Estrogen receptor positive status [ER+]: Secondary | ICD-10-CM | POA: Insufficient documentation

## 2023-01-13 DIAGNOSIS — C50411 Malignant neoplasm of upper-outer quadrant of right female breast: Secondary | ICD-10-CM | POA: Diagnosis not present

## 2023-01-13 DIAGNOSIS — R92333 Mammographic heterogeneous density, bilateral breasts: Secondary | ICD-10-CM | POA: Diagnosis not present

## 2023-01-14 ENCOUNTER — Telehealth: Payer: Self-pay | Admitting: Emergency Medicine

## 2023-01-14 ENCOUNTER — Encounter (HOSPITAL_COMMUNITY)
Admission: RE | Admit: 2023-01-14 | Discharge: 2023-01-14 | Disposition: A | Discharge status: Home / Self Care | Payer: Medicare PPO | Source: Ambulatory Visit | Attending: Internal Medicine | Admitting: Internal Medicine

## 2023-01-14 VITALS — BP 127/70 | HR 58 | Temp 97.7°F | Resp 20

## 2023-01-14 DIAGNOSIS — M8000XA Age-related osteoporosis with current pathological fracture, unspecified site, initial encounter for fracture: Secondary | ICD-10-CM | POA: Insufficient documentation

## 2023-01-14 LAB — BASIC METABOLIC PANEL
Anion gap: 8 (ref 5–15)
BUN: 20 mg/dL (ref 8–23)
CO2: 27 mmol/L (ref 22–32)
Calcium: 9.2 mg/dL (ref 8.9–10.3)
Chloride: 101 mmol/L (ref 98–111)
Creatinine, Ser: 0.83 mg/dL (ref 0.44–1.00)
GFR, Estimated: >60 mL/min (ref >60)
Glucose, Bld: 103 mg/dL — ABNORMAL HIGH (ref 70–99)
Potassium: 4.4 mmol/L (ref 3.5–5.1)
Sodium: 136 mmol/L (ref 135–145)

## 2023-01-14 MED ORDER — DENOSUMAB 60 MG/ML ~~LOC~~ SOSY
60.0000 mg | PREFILLED_SYRINGE | Freq: Once | SUBCUTANEOUS | Status: AC
  Administered 2023-01-14: 60 mg via SUBCUTANEOUS

## 2023-01-14 NOTE — Telephone Encounter (Signed)
Spoke with Nurse Jewel from Dr Karleen Hampshire office, pt has not had a calcium level checked since 08/2021 and it was 9.  Verbal order to completed a BMP on arrival to check calcium.

## 2023-01-14 NOTE — Progress Notes (Signed)
Diagnosis: Osteoporosis  Provider:  Carylon Perches MD  Procedure: Injection  Prolia (Denosumab), Dose: 60 mg, Site: subcutaneous, Number of injections: 1 Calcium Level 9.2   Post Care: Observation period completed  Discharge: Condition: Good, Destination: Home . AVS Provided  Performed by:  Daleen Squibb, RN

## 2023-01-14 NOTE — Addendum Note (Signed)
Encounter addended by: Arrie Senate, RN on: 01/14/2023 9:40 AM  Actions taken: Therapy plan modified

## 2023-01-20 NOTE — Progress Notes (Signed)
HEMATOLOGY-ONCOLOGY TELEPHONE VISIT PROGRESS NOTE  I connected with our patient on 01/23/23 at  8:30 AM EDT by telephone and verified that I am speaking with the correct person using two identifiers.  I discussed the limitations, risks, security and privacy concerns of performing an evaluation and management service by telephone and the availability of in person appointments.  I also discussed with the patient that there may be a patient responsible charge related to this service. The patient expressed understanding and agreed to proceed.   History of Present Illness: Christy Flores is a 72 y.o with right breast cancer. She presents to the clinic for a telephone follow-up  to discuss tolerance to anastrozole.    Oncology History  Malignant neoplasm of upper-outer quadrant of right breast in female, estrogen receptor positive (HCC)  10/23/2021 Initial Diagnosis   Screening mammogram detected abnormalities in the right breast: 12 o'clock position 0.7 cm: Biopsy: Grade 1 IDC with ALH ER 90%, PR 95%, Ki-67 less than 1%, HER2 1+, the other 2 biopsies were benign (fibrocystic change and focal ADH and ALH)   11/12/2021 Cancer Staging   Staging form: Breast, AJCC 8th Edition - Clinical: Stage IA (cT1b, cN0, cM0, G1, ER+, PR+, HER2-) - Signed by Serena Croissant, MD on 11/12/2021 Stage prefix: Initial diagnosis Histologic grading system: 3 grade system   11/12/2021 Genetic Testing    VUS Dicer1 gene, p.1907T. Genes tested: The CancerNext gene panel offered by [employer] includes sequencing and rearrangement analysis for the following 34 genes:   APC, ATM, BARD1, BMPR1A, BRCA1, BRCA2, BRIP1, CDH1, CDK4, CDKN2A, CHEK2, DICER1, HOXB13, EPCAM, GREM1, MLH1, MRE11A, MSH2, MSH6, MUTYH, NBN, NF1, PALB2, PMS2, POLD1, POLE, PTEN, RAD50, RAD51C, RAD51D, SMAD4, SMARCA4, STK11, and TP53.     12/17/2021 Surgery   Right lumpectomy: Grade 1 IDC 10 mm, margins negative, 0/1 lymph node negative, ER 90%, PR 95%, HER2  negative 1+, Ki-67 less than 1% Right lumpectomy: Invasive cribriform carcinoma, grade 1, 2 mm, DCIS grade 2 8 mm, margins negative, ER 100%, PR 100%, Ki-67 5%, HER2 1+ negative   01/22/2022 - 02/12/2022 Radiation Therapy   Site Technique Total Dose (Gy) Dose per Fx (Gy) Completed Fx Beam Energies  Breast, Right: Breast_R 3D 42.56/42.56 2.66 16/16 10X     02/2022 -  Anti-estrogen oral therapy   2.5 mg Letrozole x 5-7 years     REVIEW OF SYSTEMS:   Constitutional: Denies fevers, chills or abnormal weight loss All other systems were reviewed with the patient and are negative. Observations/Objective:     Assessment Plan:  Malignant neoplasm of upper-outer quadrant of right breast in female, estrogen receptor positive (HCC) 12/17/2021:Right lumpectomy: Grade 1 IDC 10 mm, margins negative, 0/1 lymph node negative, ER 90%, PR 95%, HER2 negative 1+, Ki-67 less than 1% Right lumpectomy: Invasive cribriform carcinoma, grade 1, 2 mm, DCIS grade 2 8 mm, margins negative, ER 100%, PR 100%, Ki-67 5%, HER2 1+ negative Adjuvant radiation: 01/23/2022-02/12/2022 ------------------------------------------------------------------------------------------------------------------------------------------------------ Current treatment: letrozole 2.5 mg daily started August 2023 discontinued May 2024 due to musculoskeletal aches and pains Patient symptoms resolved after stopping letrozole. Therefore I recommended switching her to anastrozole.  I sent a 30-day supply of anastrozole.   Osteoporosis: 03/03/2022: T-score -2.2 (February 2020 it was T-score of -2.5).  She also had a couple of fractures in her feet.  She is currently on Prolia every 6 months    Her husband has a feeding tube and is trying to get more nutrition   Breast cancer surveillance:  Breast exam 12/24/2022: Benign Mammogram: 01/13/23: Benign, density Cat C.  Anastrozole Toxicities: Tolerating it extremely well   F/U in 1 year    I discussed the  assessment and treatment plan with the patient. The patient was provided an opportunity to ask questions and all were answered. The patient agreed with the plan and demonstrated an understanding of the instructions. The patient was advised to call back or seek an in-person evaluation if the symptoms worsen or if the condition fails to improve as anticipated.   I provided 12 minutes of non-face-to-face time during this encounter.  This includes time for charting and coordination of care   Tamsen Meek, MD  I Janan Ridge am acting as a scribe for Dr.Apollonia Amini  I have reviewed the above documentation for accuracy and completeness, and I agree with the above.

## 2023-01-21 ENCOUNTER — Other Ambulatory Visit: Payer: Self-pay

## 2023-01-21 ENCOUNTER — Other Ambulatory Visit: Payer: Self-pay | Admitting: Hematology and Oncology

## 2023-01-23 ENCOUNTER — Inpatient Hospital Stay: Payer: Medicare PPO | Attending: Hematology and Oncology | Admitting: Hematology and Oncology

## 2023-01-23 DIAGNOSIS — Z170 Estrogen receptor positive status [ER+]: Secondary | ICD-10-CM

## 2023-01-23 DIAGNOSIS — M810 Age-related osteoporosis without current pathological fracture: Secondary | ICD-10-CM | POA: Diagnosis not present

## 2023-01-23 DIAGNOSIS — Z79811 Long term (current) use of aromatase inhibitors: Secondary | ICD-10-CM

## 2023-01-23 DIAGNOSIS — C50411 Malignant neoplasm of upper-outer quadrant of right female breast: Secondary | ICD-10-CM | POA: Diagnosis not present

## 2023-01-23 MED ORDER — ANASTROZOLE 1 MG PO TABS: 1.0000 mg | ORAL_TABLET | Freq: Every day | ORAL | 3 refills | Status: DC

## 2023-01-23 NOTE — Assessment & Plan Note (Signed)
12/17/2021:Right lumpectomy: Grade 1 IDC 10 mm, margins negative, 0/1 lymph node negative, ER 90%, PR 95%, HER2 negative 1+, Ki-67 less than 1% Right lumpectomy: Invasive cribriform carcinoma, grade 1, 2 mm, DCIS grade 2 8 mm, margins negative, ER 100%, PR 100%, Ki-67 5%, HER2 1+ negative Adjuvant radiation: 01/23/2022-02/12/2022 ------------------------------------------------------------------------------------------------------------------------------------------------------ Current treatment: letrozole 2.5 mg daily started August 2023 discontinued May 2024 due to musculoskeletal aches and pains Patient symptoms resolved after stopping letrozole. Therefore I recommended switching her to anastrozole.  I sent a 30-day supply of anastrozole.   Osteoporosis: 03/03/2022: T-score -2.2 (February 2020 it was T-score of -2.5).  She also had a couple of fractures in her feet.  She is currently on Prolia every 6 months    Her husband has a feeding tube and is scheduled to undergo surgery towards the end of May.   Breast cancer surveillance: Breast exam 12/24/2022: Benign Mammogram: 01/13/23: Benign, density Cat C.  Anastrozole Toxicities:   F/U in 1 year

## 2023-01-26 ENCOUNTER — Telehealth: Payer: Self-pay | Admitting: Hematology and Oncology

## 2023-01-26 NOTE — Telephone Encounter (Signed)
Scheduled appointments per 7/19 los. Left voicemail for patient.

## 2023-01-27 DIAGNOSIS — I890 Lymphedema, not elsewhere classified: Secondary | ICD-10-CM | POA: Diagnosis not present

## 2023-02-06 DIAGNOSIS — Z170 Estrogen receptor positive status [ER+]: Secondary | ICD-10-CM | POA: Diagnosis not present

## 2023-02-06 DIAGNOSIS — C50411 Malignant neoplasm of upper-outer quadrant of right female breast: Secondary | ICD-10-CM | POA: Diagnosis not present

## 2023-02-23 ENCOUNTER — Other Ambulatory Visit: Payer: Self-pay | Admitting: Hematology and Oncology

## 2023-02-23 NOTE — Telephone Encounter (Signed)
Per last OV, switch to anastrazole. Lorayne Marek, RN

## 2023-02-27 DIAGNOSIS — I890 Lymphedema, not elsewhere classified: Secondary | ICD-10-CM | POA: Diagnosis not present

## 2023-03-18 ENCOUNTER — Other Ambulatory Visit: Payer: Self-pay

## 2023-03-30 DIAGNOSIS — I890 Lymphedema, not elsewhere classified: Secondary | ICD-10-CM | POA: Diagnosis not present

## 2023-04-06 DIAGNOSIS — H04123 Dry eye syndrome of bilateral lacrimal glands: Secondary | ICD-10-CM | POA: Diagnosis not present

## 2023-04-08 DIAGNOSIS — F330 Major depressive disorder, recurrent, mild: Secondary | ICD-10-CM | POA: Diagnosis not present

## 2023-04-29 DIAGNOSIS — I890 Lymphedema, not elsewhere classified: Secondary | ICD-10-CM | POA: Diagnosis not present

## 2023-05-06 DIAGNOSIS — F330 Major depressive disorder, recurrent, mild: Secondary | ICD-10-CM | POA: Diagnosis not present

## 2023-05-13 ENCOUNTER — Telehealth: Payer: Self-pay | Admitting: *Deleted

## 2023-05-13 MED ORDER — EXEMESTANE 25 MG PO TABS: 25.0000 mg | ORAL_TABLET | Freq: Every day | ORAL | 0 refills | Status: DC

## 2023-05-13 NOTE — Telephone Encounter (Signed)
Received call from pt with complaint of ongoing joint pain while on Anastrozole.  Pt states she has stopped the medication for 2 weeks and symptoms have improved.   RN reviewed with MD and verbal orders received for pt to be prescribed Exemestane 25 mg p.o daily since she was not able to tolerate Letrozole or Anastrozole.  Pt educated on medication and verbalized understanding.

## 2023-05-21 ENCOUNTER — Other Ambulatory Visit: Payer: Self-pay

## 2023-05-25 ENCOUNTER — Other Ambulatory Visit: Payer: Self-pay

## 2023-05-26 ENCOUNTER — Telehealth: Payer: Self-pay

## 2023-05-26 ENCOUNTER — Other Ambulatory Visit: Payer: Self-pay

## 2023-05-26 NOTE — Telephone Encounter (Signed)
Auth Submission: APPROVED Site of care: Site of care: [facility] Payer: [employer] Medication & CPT/J Code(s) submitted: Prolia (Denosumab) [id number] Route of submission (phone, fax, portal): fax Phone # Fax # Auth type: Buy/Bill PB Units/visits requested: 60mg , q6 months Reference number: [id number] Approval from: 07/07/2021 to 07/06/2024

## 2023-05-30 DIAGNOSIS — I890 Lymphedema, not elsewhere classified: Secondary | ICD-10-CM | POA: Diagnosis not present

## 2023-06-14 ENCOUNTER — Other Ambulatory Visit: Payer: Self-pay | Admitting: Hematology and Oncology

## 2023-06-15 ENCOUNTER — Encounter: Payer: Self-pay | Admitting: Internal Medicine

## 2023-06-24 ENCOUNTER — Other Ambulatory Visit: Payer: Self-pay | Admitting: *Deleted

## 2023-06-24 ENCOUNTER — Telehealth: Payer: Self-pay | Admitting: *Deleted

## 2023-06-24 MED ORDER — EXEMESTANE 25 MG PO TABS: 25.0000 mg | ORAL_TABLET | Freq: Every day | ORAL | 3 refills | Status: DC

## 2023-06-24 NOTE — Telephone Encounter (Signed)
Received call from pt with complaint of ongoing dizziness since starting Exemestane.  Per MD pt needing to hold medication x2 weeks and f/u in office for further discussion.

## 2023-06-29 DIAGNOSIS — I890 Lymphedema, not elsewhere classified: Secondary | ICD-10-CM | POA: Diagnosis not present

## 2023-07-06 ENCOUNTER — Encounter: Payer: Self-pay | Admitting: Internal Medicine

## 2023-07-07 DIAGNOSIS — F330 Major depressive disorder, recurrent, mild: Secondary | ICD-10-CM | POA: Diagnosis not present

## 2023-07-07 DIAGNOSIS — N390 Urinary tract infection, site not specified: Secondary | ICD-10-CM | POA: Diagnosis not present

## 2023-07-14 ENCOUNTER — Inpatient Hospital Stay: Payer: Medicare PPO | Attending: Hematology and Oncology | Admitting: Hematology and Oncology

## 2023-07-14 DIAGNOSIS — Z170 Estrogen receptor positive status [ER+]: Secondary | ICD-10-CM | POA: Diagnosis not present

## 2023-07-14 DIAGNOSIS — C50411 Malignant neoplasm of upper-outer quadrant of right female breast: Secondary | ICD-10-CM | POA: Diagnosis not present

## 2023-07-14 DIAGNOSIS — Z1732 Human epidermal growth factor receptor 2 negative status: Secondary | ICD-10-CM | POA: Insufficient documentation

## 2023-07-14 DIAGNOSIS — Z79811 Long term (current) use of aromatase inhibitors: Secondary | ICD-10-CM | POA: Insufficient documentation

## 2023-07-14 DIAGNOSIS — Z1721 Progesterone receptor positive status: Secondary | ICD-10-CM | POA: Insufficient documentation

## 2023-07-14 DIAGNOSIS — Z923 Personal history of irradiation: Secondary | ICD-10-CM | POA: Insufficient documentation

## 2023-07-14 NOTE — Assessment & Plan Note (Signed)
 12/17/2021:Right lumpectomy: Grade 1 IDC 10 mm, margins negative, 0/1 lymph node negative, ER 90%, PR 95%, HER2 negative 1+, Ki-67 less than 1% Right lumpectomy: Invasive cribriform carcinoma, grade 1, 2 mm, DCIS grade 2 8 mm, margins negative, ER 100%, PR 100%, Ki-67 5%, HER2 1+ negative Adjuvant radiation: 01/23/2022-02/12/2022 ------------------------------------------------------------------------------------------------------------------------------------------------------ Current treatment: letrozole  2.5 mg daily started August 2023 discontinued May 2024 due to musculoskeletal aches and pains, switched to anastrozole  and then to exemestane   Osteoporosis: 03/03/2022: T-score -2.2 (February 2020 it was T-score of -2.5).  She also had a couple of fractures in her feet.  She is currently on Prolia  every 6 months    Her husband has a feeding tube and is trying to get more nutrition   Breast cancer surveillance: Breast exam 12/24/2022: Benign Mammogram: 01/13/23: Benign, density Cat C.   Exemestane  toxicities: Dizziness

## 2023-07-14 NOTE — Progress Notes (Signed)
 HEMATOLOGY-ONCOLOGY TELEPHONE VISIT PROGRESS NOTE  I connected with our patient on 07/14/23 at  9:45 AM EST by telephone and verified that I am speaking with the correct person using two identifiers.  I discussed the limitations, risks, security and privacy concerns of performing an evaluation and management service by telephone and the availability of in person appointments.  I also discussed with the patient that there may be a patient responsible charge related to this service. The patient expressed understanding and agreed to proceed.   History of Present Illness:  Discussed the use of AI scribe software for clinical note transcription with the patient, who gave verbal consent to proceed.  History of Present Illness   The patient, with a history of breast cancer, presents with significant adverse effects from exemestane . She reports severe fatigue, aching in the hips and legs, and difficulty getting up from a seated or lying position. The discomfort is so severe that it wakes her up at night. She also reports a bald spot on the back of her head. The patient has tried multiple medications, but all have resulted in similar side effects. She feels significantly better when not on the medication.        Oncology History  Malignant neoplasm of upper-outer quadrant of right breast in female, estrogen receptor positive (HCC)  10/23/2021 Initial Diagnosis   Screening mammogram detected abnormalities in the right breast: 12 o'clock position 0.7 cm: Biopsy: Grade 1 IDC with ALH ER 90%, PR 95%, Ki-67 less than 1%, HER2 1+, the other 2 biopsies were benign (fibrocystic change and focal ADH and ALH)   11/12/2021 Cancer Staging   Staging form: Breast, AJCC 8th Edition - Clinical: Stage IA (cT1b, cN0, cM0, G1, ER+, PR+, HER2-) - Signed by Odean Potts, MD on 11/12/2021 Stage prefix: Initial diagnosis Histologic grading system: 3 grade system   11/12/2021 Genetic Testing    VUS Dicer1 gene, p.1907T. Genes  tested: The CancerNext gene panel offered by [employer] includes sequencing and rearrangement analysis for the following 34 genes:   APC, ATM, BARD1, BMPR1A, BRCA1, BRCA2, BRIP1, CDH1, CDK4, CDKN2A, CHEK2, DICER1, HOXB13, EPCAM, GREM1, MLH1, MRE11A, MSH2, MSH6, MUTYH, NBN, NF1, PALB2, PMS2, POLD1, POLE, PTEN, RAD50, RAD51C, RAD51D, SMAD4, SMARCA4, STK11, and TP53.     12/17/2021 Surgery   Right lumpectomy: Grade 1 IDC 10 mm, margins negative, 0/1 lymph node negative, ER 90%, PR 95%, HER2 negative 1+, Ki-67 less than 1% Right lumpectomy: Invasive cribriform carcinoma, grade 1, 2 mm, DCIS grade 2 8 mm, margins negative, ER 100%, PR 100%, Ki-67 5%, HER2 1+ negative   01/22/2022 - 02/12/2022 Radiation Therapy   Site Technique Total Dose (Gy) Dose per Fx (Gy) Completed Fx Beam Energies  Breast, Right: Breast_R 3D 42.56/42.56 2.66 16/16 10X     02/2022 -  Anti-estrogen oral therapy   2.5 mg Letrozole  x 5-7 years     REVIEW OF SYSTEMS:   Constitutional: Denies fevers, chills or abnormal weight loss All other systems were reviewed with the patient and are negative. Observations/Objective:     Assessment Plan:  Malignant neoplasm of upper-outer quadrant of right breast in female, estrogen receptor positive (HCC) 12/17/2021:Right lumpectomy: Grade 1 IDC 10 mm, margins negative, 0/1 lymph node negative, ER 90%, PR 95%, HER2 negative 1+, Ki-67 less than 1% Right lumpectomy: Invasive cribriform carcinoma, grade 1, 2 mm, DCIS grade 2 8 mm, margins negative, ER 100%, PR 100%, Ki-67 5%, HER2 1+ negative Adjuvant radiation: 01/23/2022-02/12/2022 ------------------------------------------------------------------------------------------------------------------------------------------------------ Current treatment: letrozole  2.5 mg  daily started August 2023 discontinued May 2024 due to musculoskeletal aches and pains, switched to anastrozole  and then to exemestane   Osteoporosis: 03/03/2022: T-score -2.2  (February 2020 it was T-score of -2.5).  She also had a couple of fractures in her feet.  She is currently on Prolia  every 6 months    Her husband has a feeding tube and is trying to get more nutrition   Breast cancer surveillance: Breast exam 12/24/2022: Benign Mammogram: 01/13/23: Benign, density Cat C.   Exemestane  toxicities: Diffuse body aches and pains especially in the joints with inability to function well at home.  This is the same side effect that she had with the other 2 medications.    --------------------------------- Assessment and Plan    Breast Cancer Patient experienced significant side effects including fatigue, aching hips and legs, and hair loss with multiple medications. Given the lower risk nature of the cancer and the significant side effects, decision made to discontinue further medications. -Discontinue further medications. -Close monitoring and follow-up.  Bone Health Patient on Prolia  injections for bone health. -Continue Prolia  injections as scheduled. -Follow-up appointment in July.          I discussed the assessment and treatment plan with the patient. The patient was provided an opportunity to ask questions and all were answered. The patient agreed with the plan and demonstrated an understanding of the instructions. The patient was advised to call back or seek an in-person evaluation if the symptoms worsen or if the condition fails to improve as anticipated.   I provided 12 minutes of non-face-to-face time during this encounter.  This includes time for charting and coordination of care   Naomi MARLA Chad, MD

## 2023-07-17 ENCOUNTER — Encounter (HOSPITAL_COMMUNITY): Payer: Medicare PPO

## 2023-07-20 ENCOUNTER — Encounter (HOSPITAL_COMMUNITY): Admission: RE | Admit: 2023-07-20 | Payer: Medicare PPO | Source: Ambulatory Visit

## 2023-07-20 ENCOUNTER — Encounter (HOSPITAL_COMMUNITY): Payer: Medicare PPO | Attending: Internal Medicine | Admitting: *Deleted

## 2023-07-20 ENCOUNTER — Ambulatory Visit (HOSPITAL_COMMUNITY): Payer: Medicare PPO

## 2023-07-20 ENCOUNTER — Encounter (HOSPITAL_COMMUNITY): Payer: Medicare PPO

## 2023-07-20 VITALS — BP 111/72 | HR 91 | Temp 97.5°F | Resp 18

## 2023-07-20 DIAGNOSIS — R1084 Generalized abdominal pain: Secondary | ICD-10-CM | POA: Diagnosis not present

## 2023-07-20 DIAGNOSIS — M8000XA Age-related osteoporosis with current pathological fracture, unspecified site, initial encounter for fracture: Secondary | ICD-10-CM | POA: Insufficient documentation

## 2023-07-20 DIAGNOSIS — N3941 Urge incontinence: Secondary | ICD-10-CM | POA: Diagnosis not present

## 2023-07-20 MED ORDER — DENOSUMAB 60 MG/ML ~~LOC~~ SOSY
60.0000 mg | PREFILLED_SYRINGE | Freq: Once | SUBCUTANEOUS | Status: AC
  Administered 2023-07-20: 60 mg via SUBCUTANEOUS

## 2023-07-20 NOTE — Progress Notes (Signed)
 Diagnosis: Osteoporosis  Provider:  Carylon Perches MD  Procedure: Injection  Prolia (Denosumab), Dose: 60 mg, Site: subcutaneous, Number of injections: 1  Injection Site(s): Left arm  Post Care: Observation period completed  Discharge: Condition: Good, Destination: Home . AVS Provided  Performed by:  Daleen Squibb, RN

## 2023-07-21 ENCOUNTER — Telehealth: Payer: Self-pay | Admitting: Hematology and Oncology

## 2023-07-21 NOTE — Telephone Encounter (Signed)
 Patient called to cancel injection scheduled for 1/20.Christy Flores Patient had injection on 1/13 @[facility] location. Appt was canalled lab appt was kept.

## 2023-07-22 DIAGNOSIS — M6281 Muscle weakness (generalized): Secondary | ICD-10-CM | POA: Diagnosis not present

## 2023-07-22 DIAGNOSIS — R350 Frequency of micturition: Secondary | ICD-10-CM | POA: Diagnosis not present

## 2023-07-22 DIAGNOSIS — R1084 Generalized abdominal pain: Secondary | ICD-10-CM | POA: Diagnosis not present

## 2023-07-22 DIAGNOSIS — N3946 Mixed incontinence: Secondary | ICD-10-CM | POA: Diagnosis not present

## 2023-07-24 ENCOUNTER — Other Ambulatory Visit: Payer: Self-pay

## 2023-07-24 DIAGNOSIS — Z170 Estrogen receptor positive status [ER+]: Secondary | ICD-10-CM

## 2023-07-27 ENCOUNTER — Inpatient Hospital Stay: Payer: Medicare PPO

## 2023-07-27 ENCOUNTER — Encounter: Payer: Self-pay | Admitting: Internal Medicine

## 2023-07-27 DIAGNOSIS — C50411 Malignant neoplasm of upper-outer quadrant of right female breast: Secondary | ICD-10-CM | POA: Diagnosis not present

## 2023-07-27 DIAGNOSIS — Z1732 Human epidermal growth factor receptor 2 negative status: Secondary | ICD-10-CM | POA: Diagnosis not present

## 2023-07-27 DIAGNOSIS — Z170 Estrogen receptor positive status [ER+]: Secondary | ICD-10-CM

## 2023-07-27 DIAGNOSIS — Z1721 Progesterone receptor positive status: Secondary | ICD-10-CM | POA: Diagnosis not present

## 2023-07-27 DIAGNOSIS — Z923 Personal history of irradiation: Secondary | ICD-10-CM | POA: Diagnosis not present

## 2023-07-27 DIAGNOSIS — Z79811 Long term (current) use of aromatase inhibitors: Secondary | ICD-10-CM | POA: Diagnosis not present

## 2023-07-27 LAB — CBC WITH DIFFERENTIAL (CANCER CENTER ONLY)
Abs Immature Granulocytes: 0.01 10*3/uL (ref 0.00–0.07)
Basophils Absolute: 0 10*3/uL (ref 0.0–0.1)
Basophils Relative: 1 %
Eosinophils Absolute: 0.1 10*3/uL (ref 0.0–0.5)
Eosinophils Relative: 3 %
HCT: 41.3 % (ref 36.0–46.0)
Hemoglobin: 13.8 g/dL (ref 12.0–15.0)
Immature Granulocytes: 0 %
Lymphocytes Relative: 32 %
Lymphs Abs: 1.3 10*3/uL (ref 0.7–4.0)
MCH: 31.8 pg (ref 26.0–34.0)
MCHC: 33.4 g/dL (ref 30.0–36.0)
MCV: 95.2 fL (ref 80.0–100.0)
Monocytes Absolute: 0.4 10*3/uL (ref 0.1–1.0)
Monocytes Relative: 9 %
Neutro Abs: 2.2 10*3/uL (ref 1.7–7.7)
Neutrophils Relative %: 55 %
Platelet Count: 235 10*3/uL (ref 150–400)
RBC: 4.34 MIL/uL (ref 3.87–5.11)
RDW: 12.2 % (ref 11.5–15.5)
WBC Count: 4 10*3/uL (ref 4.0–10.5)
nRBC: 0 % (ref 0.0–0.2)

## 2023-07-27 LAB — BASIC METABOLIC PANEL - CANCER CENTER ONLY
Anion gap: 6 (ref 5–15)
BUN: 18 mg/dL (ref 8–23)
CO2: 31 mmol/L (ref 22–32)
Calcium: 9.9 mg/dL (ref 8.9–10.3)
Chloride: 101 mmol/L (ref 98–111)
Creatinine: 0.75 mg/dL (ref 0.44–1.00)
GFR, Estimated: >60 mL/min (ref >60)
Glucose, Bld: 94 mg/dL (ref 70–99)
Potassium: 4.3 mmol/L (ref 3.5–5.1)
Sodium: 138 mmol/L (ref 135–145)

## 2023-07-30 DIAGNOSIS — I890 Lymphedema, not elsewhere classified: Secondary | ICD-10-CM | POA: Diagnosis not present

## 2023-08-21 DIAGNOSIS — R102 Pelvic and perineal pain: Secondary | ICD-10-CM | POA: Diagnosis not present

## 2023-08-21 DIAGNOSIS — M6281 Muscle weakness (generalized): Secondary | ICD-10-CM | POA: Diagnosis not present

## 2023-08-21 DIAGNOSIS — R350 Frequency of micturition: Secondary | ICD-10-CM | POA: Diagnosis not present

## 2023-08-21 DIAGNOSIS — N3946 Mixed incontinence: Secondary | ICD-10-CM | POA: Diagnosis not present

## 2023-08-24 DIAGNOSIS — M6281 Muscle weakness (generalized): Secondary | ICD-10-CM | POA: Diagnosis not present

## 2023-08-24 DIAGNOSIS — N3946 Mixed incontinence: Secondary | ICD-10-CM | POA: Diagnosis not present

## 2023-08-24 DIAGNOSIS — R1084 Generalized abdominal pain: Secondary | ICD-10-CM | POA: Diagnosis not present

## 2023-08-24 DIAGNOSIS — R350 Frequency of micturition: Secondary | ICD-10-CM | POA: Diagnosis not present

## 2023-08-26 DIAGNOSIS — M545 Low back pain, unspecified: Secondary | ICD-10-CM | POA: Diagnosis not present

## 2023-08-26 DIAGNOSIS — N200 Calculus of kidney: Secondary | ICD-10-CM | POA: Diagnosis not present

## 2023-08-26 DIAGNOSIS — R1032 Left lower quadrant pain: Secondary | ICD-10-CM | POA: Diagnosis not present

## 2023-08-26 DIAGNOSIS — K5730 Diverticulosis of large intestine without perforation or abscess without bleeding: Secondary | ICD-10-CM | POA: Diagnosis not present

## 2023-08-30 DIAGNOSIS — I890 Lymphedema, not elsewhere classified: Secondary | ICD-10-CM | POA: Diagnosis not present

## 2023-09-18 DIAGNOSIS — N3946 Mixed incontinence: Secondary | ICD-10-CM | POA: Diagnosis not present

## 2023-09-18 DIAGNOSIS — R102 Pelvic and perineal pain: Secondary | ICD-10-CM | POA: Diagnosis not present

## 2023-09-23 ENCOUNTER — Other Ambulatory Visit: Payer: Self-pay | Admitting: Urology

## 2023-09-23 DIAGNOSIS — M545 Low back pain, unspecified: Secondary | ICD-10-CM

## 2023-09-27 DIAGNOSIS — I890 Lymphedema, not elsewhere classified: Secondary | ICD-10-CM | POA: Diagnosis not present

## 2023-09-30 ENCOUNTER — Ambulatory Visit
Admission: RE | Admit: 2023-09-30 | Discharge: 2023-09-30 | Disposition: A | Discharge status: Home / Self Care | Source: Ambulatory Visit | Attending: Urology | Admitting: Urology

## 2023-09-30 ENCOUNTER — Encounter: Payer: Self-pay | Admitting: Internal Medicine

## 2023-09-30 DIAGNOSIS — M5126 Other intervertebral disc displacement, lumbar region: Secondary | ICD-10-CM | POA: Diagnosis not present

## 2023-09-30 DIAGNOSIS — M545 Low back pain, unspecified: Secondary | ICD-10-CM

## 2023-09-30 DIAGNOSIS — M48061 Spinal stenosis, lumbar region without neurogenic claudication: Secondary | ICD-10-CM | POA: Diagnosis not present

## 2023-09-30 DIAGNOSIS — M47816 Spondylosis without myelopathy or radiculopathy, lumbar region: Secondary | ICD-10-CM | POA: Diagnosis not present

## 2023-09-30 MED ORDER — GADOPICLENOL 0.5 MMOL/ML IV SOLN
7.5000 mL | Freq: Once | INTRAVENOUS | Status: AC | PRN
  Administered 2023-09-30: 7.5 mL via INTRAVENOUS

## 2023-10-01 ENCOUNTER — Other Ambulatory Visit

## 2023-10-08 DIAGNOSIS — R350 Frequency of micturition: Secondary | ICD-10-CM | POA: Diagnosis not present

## 2023-10-08 DIAGNOSIS — N3941 Urge incontinence: Secondary | ICD-10-CM | POA: Diagnosis not present

## 2023-10-26 DIAGNOSIS — N3941 Urge incontinence: Secondary | ICD-10-CM | POA: Diagnosis not present

## 2023-10-26 DIAGNOSIS — M5416 Radiculopathy, lumbar region: Secondary | ICD-10-CM | POA: Diagnosis not present

## 2023-10-26 DIAGNOSIS — M545 Low back pain, unspecified: Secondary | ICD-10-CM | POA: Diagnosis not present

## 2023-10-26 DIAGNOSIS — Z6828 Body mass index (BMI) 28.0-28.9, adult: Secondary | ICD-10-CM | POA: Diagnosis not present

## 2023-10-28 DIAGNOSIS — M5416 Radiculopathy, lumbar region: Secondary | ICD-10-CM | POA: Diagnosis not present

## 2023-10-28 DIAGNOSIS — M545 Low back pain, unspecified: Secondary | ICD-10-CM | POA: Diagnosis not present

## 2023-10-28 DIAGNOSIS — I890 Lymphedema, not elsewhere classified: Secondary | ICD-10-CM | POA: Diagnosis not present

## 2023-11-03 DIAGNOSIS — M5416 Radiculopathy, lumbar region: Secondary | ICD-10-CM | POA: Diagnosis not present

## 2023-11-10 DIAGNOSIS — M545 Low back pain, unspecified: Secondary | ICD-10-CM | POA: Diagnosis not present

## 2023-11-27 DIAGNOSIS — I890 Lymphedema, not elsewhere classified: Secondary | ICD-10-CM | POA: Diagnosis not present

## 2023-12-04 DIAGNOSIS — M48061 Spinal stenosis, lumbar region without neurogenic claudication: Secondary | ICD-10-CM | POA: Diagnosis not present

## 2023-12-04 DIAGNOSIS — M5126 Other intervertebral disc displacement, lumbar region: Secondary | ICD-10-CM | POA: Diagnosis not present

## 2023-12-04 DIAGNOSIS — M5116 Intervertebral disc disorders with radiculopathy, lumbar region: Secondary | ICD-10-CM | POA: Diagnosis not present

## 2023-12-30 DIAGNOSIS — L259 Unspecified contact dermatitis, unspecified cause: Secondary | ICD-10-CM | POA: Diagnosis not present

## 2024-01-22 ENCOUNTER — Other Ambulatory Visit: Payer: Self-pay | Admitting: *Deleted

## 2024-01-22 DIAGNOSIS — Z170 Estrogen receptor positive status [ER+]: Secondary | ICD-10-CM

## 2024-01-25 ENCOUNTER — Inpatient Hospital Stay: Payer: Medicare PPO | Attending: Hematology and Oncology | Admitting: Hematology and Oncology

## 2024-01-25 ENCOUNTER — Inpatient Hospital Stay: Payer: Medicare PPO

## 2024-01-25 VITALS — BP 128/72 | HR 75 | Temp 97.6°F | Resp 16 | Ht 64.0 in | Wt 170.3 lb

## 2024-01-25 DIAGNOSIS — M810 Age-related osteoporosis without current pathological fracture: Secondary | ICD-10-CM

## 2024-01-25 DIAGNOSIS — C50411 Malignant neoplasm of upper-outer quadrant of right female breast: Secondary | ICD-10-CM

## 2024-01-25 DIAGNOSIS — Z853 Personal history of malignant neoplasm of breast: Secondary | ICD-10-CM | POA: Insufficient documentation

## 2024-01-25 DIAGNOSIS — Z170 Estrogen receptor positive status [ER+]: Secondary | ICD-10-CM

## 2024-01-25 LAB — CBC WITH DIFFERENTIAL (CANCER CENTER ONLY)
Abs Immature Granulocytes: 0.01 K/uL (ref 0.00–0.07)
Basophils Absolute: 0 K/uL (ref 0.0–0.1)
Basophils Relative: 1 %
Eosinophils Absolute: 0.2 K/uL (ref 0.0–0.5)
Eosinophils Relative: 5 %
HCT: 40.3 % (ref 36.0–46.0)
Hemoglobin: 13.9 g/dL (ref 12.0–15.0)
Immature Granulocytes: 0 %
Lymphocytes Relative: 27 %
Lymphs Abs: 1.1 K/uL (ref 0.7–4.0)
MCH: 32.9 pg (ref 26.0–34.0)
MCHC: 34.5 g/dL (ref 30.0–36.0)
MCV: 95.5 fL (ref 80.0–100.0)
Monocytes Absolute: 0.3 K/uL (ref 0.1–1.0)
Monocytes Relative: 8 %
Neutro Abs: 2.4 K/uL (ref 1.7–7.7)
Neutrophils Relative %: 59 %
Platelet Count: 221 K/uL (ref 150–400)
RBC: 4.22 MIL/uL (ref 3.87–5.11)
RDW: 12.5 % (ref 11.5–15.5)
WBC Count: 4.1 K/uL (ref 4.0–10.5)
nRBC: 0 % (ref 0.0–0.2)

## 2024-01-25 LAB — CMP (CANCER CENTER ONLY)
ALT: 25 U/L (ref 0–44)
AST: 19 U/L (ref 15–41)
Albumin: 4.4 g/dL (ref 3.5–5.0)
Alkaline Phosphatase: 47 U/L (ref 38–126)
Anion gap: 7 (ref 5–15)
BUN: 18 mg/dL (ref 8–23)
CO2: 30 mmol/L (ref 22–32)
Calcium: 9.3 mg/dL (ref 8.9–10.3)
Chloride: 102 mmol/L (ref 98–111)
Creatinine: 0.73 mg/dL (ref 0.44–1.00)
GFR, Estimated: >60 mL/min (ref >60)
Glucose, Bld: 119 mg/dL — ABNORMAL HIGH (ref 70–99)
Potassium: 4.2 mmol/L (ref 3.5–5.1)
Sodium: 139 mmol/L (ref 135–145)
Total Bilirubin: 0.3 mg/dL (ref 0.0–1.2)
Total Protein: 7.2 g/dL (ref 6.5–8.1)

## 2024-01-25 MED ORDER — DENOSUMAB 60 MG/ML ~~LOC~~ SOSY
60.0000 mg | PREFILLED_SYRINGE | Freq: Once | SUBCUTANEOUS | Status: AC
  Administered 2024-01-25: 60 mg via SUBCUTANEOUS
  Filled 2024-01-25: qty 1

## 2024-01-25 NOTE — Progress Notes (Signed)
 Patient Care Team: Sheryle Carwin, MD as PCP - General (Internal Medicine) Odean Potts, MD as Consulting Physician (Hematology and Oncology) Izell Domino, MD as Attending Physician (Radiation Oncology) Ebbie Cough, MD as Consulting Physician (General Surgery)  DIAGNOSIS:  Encounter Diagnosis  Name Primary?   Malignant neoplasm of upper-outer quadrant of right breast in female, estrogen receptor positive (HCC) Yes    SUMMARY OF ONCOLOGIC HISTORY: Oncology History  Malignant neoplasm of upper-outer quadrant of right breast in female, estrogen receptor positive (HCC)  10/23/2021 Initial Diagnosis   Screening mammogram detected abnormalities in the right breast: 12 o'clock position 0.7 cm: Biopsy: Grade 1 IDC with ALH ER 90%, PR 95%, Ki-67 less than 1%, HER2 1+, the other 2 biopsies were benign (fibrocystic change and focal ADH and ALH)   11/12/2021 Cancer Staging   Staging form: Breast, AJCC 8th Edition - Clinical: Stage IA (cT1b, cN0, cM0, G1, ER+, PR+, HER2-) - Signed by Odean Potts, MD on 11/12/2021 Stage prefix: Initial diagnosis Histologic grading system: 3 grade system   11/12/2021 Genetic Testing    VUS Dicer1 gene, p.1907T. Genes tested: The CancerNext gene panel offered by [employer] includes sequencing and rearrangement analysis for the following 34 genes:   APC, ATM, BARD1, BMPR1A, BRCA1, BRCA2, BRIP1, CDH1, CDK4, CDKN2A, CHEK2, DICER1, HOXB13, EPCAM, GREM1, MLH1, MRE11A, MSH2, MSH6, MUTYH, NBN, NF1, PALB2, PMS2, POLD1, POLE, PTEN, RAD50, RAD51C, RAD51D, SMAD4, SMARCA4, STK11, and TP53.     12/17/2021 Surgery   Right lumpectomy: Grade 1 IDC 10 mm, margins negative, 0/1 lymph node negative, ER 90%, PR 95%, HER2 negative 1+, Ki-67 less than 1% Right lumpectomy: Invasive cribriform carcinoma, grade 1, 2 mm, DCIS grade 2 8 mm, margins negative, ER 100%, PR 100%, Ki-67 5%, HER2 1+ negative   01/22/2022 - 02/12/2022 Radiation Therapy   Site Technique Total Dose (Gy) Dose per Fx  (Gy) Completed Fx Beam Energies  Breast, Right: Breast_R 3D 42.56/42.56 2.66 16/16 10X     02/2022 -  Anti-estrogen oral therapy   2.5 mg Letrozole  x 5-7 years     CHIEF COMPLIANT: Surveillance of breast cancer  HISTORY OF PRESENT ILLNESS: History of Present Illness Christy Flores is a 73 year old female with a history of breast cancer who presents for follow-up regarding back pain and medication management.  She has a history of back issues, including a surgery 30 years ago. Recently, she experienced severe back pain, loss of bladder control, and leg pain, leading to the discovery of crushed vertebrae at L4 and L5, and a herniated disc at L1 and L2. Surgery has significantly improved her symptoms.  She discontinued her breast cancer medication due to pain, initially suspecting a connection. Currently, she is not taking any medications except for Prolia , administered every six months. She acknowledges the need for calcium and B12 supplements but is not taking them.     ALLERGIES:  is allergic to codeine.  MEDICATIONS: Does not take any medications PHYSICAL EXAMINATION: ECOG PERFORMANCE STATUS: 1 - Symptomatic but completely ambulatory  Vitals:   01/25/24 1312  BP: 128/72  Pulse: 75  Resp: 16  Temp: 97.6 F (36.4 C)  SpO2: 99%   Filed Weights   01/25/24 1312  Weight: 170 lb 4.8 oz (77.2 kg)      LABORATORY DATA:  I have reviewed the data as listed    Latest Ref Rng & Units 07/27/2023    1:49 PM 01/14/2023    9:06 AM 06/25/2020   12:40 PM  CMP  Glucose 70 - 99 mg/dL 94  896  98   BUN 8 - 23 mg/dL 18  20  14    Creatinine 0.44 - 1.00 mg/dL 9.24  9.16  9.29   Sodium 135 - 145 mmol/L 138  136  139   Potassium 3.5 - 5.1 mmol/L 4.3  4.4  4.3   Chloride 98 - 111 mmol/L 101  101  103   CO2 22 - 32 mmol/L 31  27    Calcium 8.9 - 10.3 mg/dL 9.9  9.2      Lab Results  Component Value Date   WBC 4.1 01/25/2024   HGB 13.9 01/25/2024   HCT 40.3 01/25/2024   MCV 95.5  01/25/2024   PLT 221 01/25/2024   NEUTROABS 2.4 01/25/2024    ASSESSMENT & PLAN:  Malignant neoplasm of upper-outer quadrant of right breast in female, estrogen receptor positive (HCC) 12/17/2021:Right lumpectomy: Grade 1 IDC 10 mm, margins negative, 0/1 lymph node negative, ER 90%, PR 95%, HER2 negative 1+, Ki-67 less than 1% Right lumpectomy: Invasive cribriform carcinoma, grade 1, 2 mm, DCIS grade 2 8 mm, margins negative, ER 100%, PR 100%, Ki-67 5%, HER2 1+ negative Adjuvant radiation: 01/23/2022-02/12/2022 ------------------------------------------------------------------------------------------------------------------------------------------------------ Current treatment: letrozole  2.5 mg daily started August 2023 discontinued May 2024 due to musculoskeletal aches and pains, switched to anastrozole  and then to exemestane    Osteoporosis: 03/03/2022: T-score -2.2 (February 2020 it was T-score of -2.5).  She also had a couple of fractures in her feet.  She is currently on Prolia  every 6 months    Her husband has a feeding tube and is trying to get more nutrition   Breast cancer surveillance: Breast exam 01/25/2024: Benign Mammogram: 01/13/23: Benign, density Cat C.   Exemestane  toxicities: Diffuse body aches and pains especially in the joints with inability to function well at home.  This is the same side effect that she had with the other 2 medications.  Return to clinic every 6 months for Prolia  injections with labs in 1 year follow-up with me  ------------------------------------- Assessment and Plan Assessment & Plan Malignant neoplasm of upper-outer quadrant of right breast Favorable small 10 mm tumor with an additional 2 mm lesion. Risk of recurrence estimated at 5-6% over ten years. She opted not to resume estrogen receptor modulator due to previous pain and discomfort. Decision to monitor without medication was agreed upon. - Monitor without medication. - Schedule  mammogram.  Osteoporosis Currently on Prolia  for management. Due for next dose today. Does not take calcium or B12 supplements, acknowledges need for calcium. - Administer Prolia  today. - Continue Prolia  every 6 months. - Encourage calcium supplementation.      Orders Placed This Encounter  Procedures   MM DIAG BREAST TOMO BILATERAL    Standing Status:   Future    Expected Date:   02/25/2024    Expiration Date:   01/24/2025    Reason for Exam (SYMPTOM  OR DIAGNOSIS REQUIRED):   Annual mammograms    Preferred imaging location?:   [facility]    Release to patient:   Immediate   The patient has a good understanding of the overall plan. she agrees with it. she will call with any problems that may develop before the next visit here. Total time spent: 30 mins including face to face time and time spent for planning, charting and co-ordination of care   Christy MARLA Chad, MD 01/25/24

## 2024-01-25 NOTE — Addendum Note (Signed)
 Addended by: ODEAN POTTS on: 01/25/2024 02:08 PM   Modules accepted: Orders

## 2024-01-25 NOTE — Assessment & Plan Note (Signed)
 12/17/2021:Right lumpectomy: Grade 1 IDC 10 mm, margins negative, 0/1 lymph node negative, ER 90%, PR 95%, HER2 negative 1+, Ki-67 less than 1% Right lumpectomy: Invasive cribriform carcinoma, grade 1, 2 mm, DCIS grade 2 8 mm, margins negative, ER 100%, PR 100%, Ki-67 5%, HER2 1+ negative Adjuvant radiation: 01/23/2022-02/12/2022 ------------------------------------------------------------------------------------------------------------------------------------------------------ Current treatment: letrozole  2.5 mg daily started August 2023 discontinued May 2024 due to musculoskeletal aches and pains, switched to anastrozole  and then to exemestane    Osteoporosis: 03/03/2022: T-score -2.2 (February 2020 it was T-score of -2.5).  She also had a couple of fractures in her feet.  She is currently on Prolia  every 6 months    Her husband has a feeding tube and is trying to get more nutrition   Breast cancer surveillance: Breast exam 01/25/2024: Benign Mammogram: 01/13/23: Benign, density Cat C.   Exemestane  toxicities: Diffuse body aches and pains especially in the joints with inability to function well at home.  This is the same side effect that she had with the other 2 medications.  Return to clinic every 6 months for Prolia  injections with labs in 1 year follow-up with me

## 2024-01-26 ENCOUNTER — Ambulatory Visit: Payer: Medicare PPO

## 2024-01-29 DIAGNOSIS — M545 Low back pain, unspecified: Secondary | ICD-10-CM | POA: Diagnosis not present

## 2024-02-05 DIAGNOSIS — M545 Low back pain, unspecified: Secondary | ICD-10-CM | POA: Diagnosis not present

## 2024-02-12 DIAGNOSIS — M545 Low back pain, unspecified: Secondary | ICD-10-CM | POA: Diagnosis not present

## 2024-02-23 DIAGNOSIS — M545 Low back pain, unspecified: Secondary | ICD-10-CM | POA: Diagnosis not present

## 2024-04-05 ENCOUNTER — Other Ambulatory Visit (HOSPITAL_COMMUNITY): Payer: Self-pay | Admitting: Hematology and Oncology

## 2024-04-05 DIAGNOSIS — Z853 Personal history of malignant neoplasm of breast: Secondary | ICD-10-CM

## 2024-04-06 DIAGNOSIS — H04123 Dry eye syndrome of bilateral lacrimal glands: Secondary | ICD-10-CM | POA: Diagnosis not present

## 2024-04-07 ENCOUNTER — Ambulatory Visit (HOSPITAL_COMMUNITY)
Admission: RE | Admit: 2024-04-07 | Discharge: 2024-04-07 | Disposition: A | Discharge status: Home / Self Care | Source: Ambulatory Visit | Attending: Hematology and Oncology | Admitting: Hematology and Oncology

## 2024-04-07 ENCOUNTER — Encounter (HOSPITAL_COMMUNITY): Payer: Self-pay

## 2024-04-07 ENCOUNTER — Ambulatory Visit (HOSPITAL_COMMUNITY): Admission: RE | Admit: 2024-04-07 | Discharge: 2024-04-07 | Attending: Hematology and Oncology

## 2024-04-07 DIAGNOSIS — C50411 Malignant neoplasm of upper-outer quadrant of right female breast: Secondary | ICD-10-CM | POA: Insufficient documentation

## 2024-04-07 DIAGNOSIS — Z853 Personal history of malignant neoplasm of breast: Secondary | ICD-10-CM | POA: Diagnosis not present

## 2024-04-07 DIAGNOSIS — R92333 Mammographic heterogeneous density, bilateral breasts: Secondary | ICD-10-CM | POA: Diagnosis not present

## 2024-04-07 DIAGNOSIS — Z170 Estrogen receptor positive status [ER+]: Secondary | ICD-10-CM | POA: Diagnosis not present

## 2024-04-25 DIAGNOSIS — N3281 Overactive bladder: Secondary | ICD-10-CM | POA: Diagnosis not present

## 2024-04-25 DIAGNOSIS — R399 Unspecified symptoms and signs involving the genitourinary system: Secondary | ICD-10-CM | POA: Diagnosis not present

## 2024-07-26 ENCOUNTER — Other Ambulatory Visit: Payer: Self-pay

## 2024-07-26 DIAGNOSIS — C50411 Malignant neoplasm of upper-outer quadrant of right female breast: Secondary | ICD-10-CM

## 2024-07-27 ENCOUNTER — Inpatient Hospital Stay: Attending: Hematology and Oncology

## 2024-07-27 ENCOUNTER — Inpatient Hospital Stay

## 2024-07-27 VITALS — BP 128/64 | HR 61 | Temp 97.8°F | Resp 16

## 2024-07-27 DIAGNOSIS — Z853 Personal history of malignant neoplasm of breast: Secondary | ICD-10-CM | POA: Diagnosis not present

## 2024-07-27 DIAGNOSIS — M8000XA Age-related osteoporosis with current pathological fracture, unspecified site, initial encounter for fracture: Secondary | ICD-10-CM

## 2024-07-27 DIAGNOSIS — Z170 Estrogen receptor positive status [ER+]: Secondary | ICD-10-CM

## 2024-07-27 DIAGNOSIS — M810 Age-related osteoporosis without current pathological fracture: Secondary | ICD-10-CM | POA: Diagnosis present

## 2024-07-27 LAB — CBC WITH DIFFERENTIAL (CANCER CENTER ONLY)
Abs Immature Granulocytes: 0.01 K/uL (ref 0.00–0.07)
Basophils Absolute: 0 K/uL (ref 0.0–0.1)
Basophils Relative: 1 %
Eosinophils Absolute: 0.1 K/uL (ref 0.0–0.5)
Eosinophils Relative: 3 %
HCT: 39.8 % (ref 36.0–46.0)
Hemoglobin: 13.2 g/dL (ref 12.0–15.0)
Immature Granulocytes: 0 %
Lymphocytes Relative: 32 %
Lymphs Abs: 1.1 K/uL (ref 0.7–4.0)
MCH: 31.4 pg (ref 26.0–34.0)
MCHC: 33.2 g/dL (ref 30.0–36.0)
MCV: 94.5 fL (ref 80.0–100.0)
Monocytes Absolute: 0.3 K/uL (ref 0.1–1.0)
Monocytes Relative: 9 %
Neutro Abs: 2 K/uL (ref 1.7–7.7)
Neutrophils Relative %: 55 %
Platelet Count: 188 K/uL (ref 150–400)
RBC: 4.21 MIL/uL (ref 3.87–5.11)
RDW: 12.5 % (ref 11.5–15.5)
WBC Count: 3.5 K/uL — ABNORMAL LOW (ref 4.0–10.5)
nRBC: 0 % (ref 0.0–0.2)

## 2024-07-27 LAB — CMP (CANCER CENTER ONLY)
ALT: 22 U/L (ref 0–44)
AST: 22 U/L (ref 15–41)
Albumin: 4.3 g/dL (ref 3.5–5.0)
Alkaline Phosphatase: 45 U/L (ref 38–126)
Anion gap: 10 (ref 5–15)
BUN: 14 mg/dL (ref 8–23)
CO2: 28 mmol/L (ref 22–32)
Calcium: 9.2 mg/dL (ref 8.9–10.3)
Chloride: 103 mmol/L (ref 98–111)
Creatinine: 0.73 mg/dL (ref 0.44–1.00)
GFR, Estimated: >60 mL/min
Glucose, Bld: 95 mg/dL (ref 70–99)
Potassium: 4.8 mmol/L (ref 3.5–5.1)
Sodium: 140 mmol/L (ref 135–145)
Total Bilirubin: 0.3 mg/dL (ref 0.0–1.2)
Total Protein: 6.8 g/dL (ref 6.5–8.1)

## 2024-07-27 MED ORDER — DENOSUMAB 60 MG/ML ~~LOC~~ SOSY
60.0000 mg | PREFILLED_SYRINGE | Freq: Once | SUBCUTANEOUS | Status: AC
  Administered 2024-07-27: 60 mg via SUBCUTANEOUS
  Filled 2024-07-27: qty 1

## 2025-01-24 ENCOUNTER — Inpatient Hospital Stay: Admitting: Hematology and Oncology

## 2025-01-24 ENCOUNTER — Inpatient Hospital Stay

## 2025-01-24 ENCOUNTER — Ambulatory Visit
